# Patient Record
Sex: Female | Born: 1935 | Race: White | Hispanic: No | Marital: Married | Smoking: Former smoker
Health system: Southern US, Community
[De-identification: ages and names within clinical notes are randomized; demographics above are authoritative.]

## PROBLEM LIST (undated history)

## (undated) DIAGNOSIS — C801 Malignant (primary) neoplasm, unspecified: Secondary | ICD-10-CM

## (undated) DIAGNOSIS — E785 Hyperlipidemia, unspecified: Secondary | ICD-10-CM

## (undated) DIAGNOSIS — I272 Pulmonary hypertension, unspecified: Secondary | ICD-10-CM

## (undated) DIAGNOSIS — M1990 Unspecified osteoarthritis, unspecified site: Secondary | ICD-10-CM

## (undated) DIAGNOSIS — I482 Chronic atrial fibrillation, unspecified: Secondary | ICD-10-CM

## (undated) DIAGNOSIS — H409 Unspecified glaucoma: Secondary | ICD-10-CM

## (undated) DIAGNOSIS — J3800 Paralysis of vocal cords and larynx, unspecified: Secondary | ICD-10-CM

## (undated) DIAGNOSIS — I10 Essential (primary) hypertension: Secondary | ICD-10-CM

## (undated) DIAGNOSIS — Z7901 Long term (current) use of anticoagulants: Secondary | ICD-10-CM

## (undated) DIAGNOSIS — I5189 Other ill-defined heart diseases: Secondary | ICD-10-CM

## (undated) HISTORY — DX: Long term (current) use of anticoagulants: Z79.01

## (undated) HISTORY — DX: Unspecified glaucoma: H40.9

## (undated) HISTORY — DX: Pulmonary hypertension, unspecified: I27.20

## (undated) HISTORY — DX: Malignant (primary) neoplasm, unspecified: C80.1

## (undated) HISTORY — DX: Other ill-defined heart diseases: I51.89

## (undated) HISTORY — PX: FACIAL COSMETIC SURGERY: SHX629

## (undated) HISTORY — DX: Unspecified osteoarthritis, unspecified site: M19.90

## (undated) HISTORY — DX: Paralysis of vocal cords and larynx, unspecified: J38.00

## (undated) HISTORY — PX: OTHER SURGICAL HISTORY: SHX169

## (undated) HISTORY — DX: Chronic atrial fibrillation, unspecified: I48.20

## (undated) HISTORY — DX: Hyperlipidemia, unspecified: E78.5

## (undated) HISTORY — PX: BACK SURGERY: SHX140

## (undated) HISTORY — PX: BREAST SURGERY: SHX581

## (undated) HISTORY — DX: Essential (primary) hypertension: I10

---

## 1998-07-10 ENCOUNTER — Encounter: Payer: Self-pay | Admitting: *Deleted

## 1998-07-10 ENCOUNTER — Ambulatory Visit (HOSPITAL_COMMUNITY): Admission: RE | Admit: 1998-07-10 | Discharge: 1998-07-10 | Payer: Self-pay | Admitting: *Deleted

## 1999-10-28 ENCOUNTER — Encounter: Payer: Self-pay | Admitting: *Deleted

## 1999-10-28 ENCOUNTER — Ambulatory Visit (HOSPITAL_COMMUNITY): Admission: RE | Admit: 1999-10-28 | Discharge: 1999-10-28 | Payer: Self-pay | Admitting: *Deleted

## 2000-08-03 ENCOUNTER — Ambulatory Visit (HOSPITAL_COMMUNITY): Admission: RE | Admit: 2000-08-03 | Discharge: 2000-08-03 | Payer: Self-pay | Admitting: *Deleted

## 2001-02-09 ENCOUNTER — Encounter: Admission: RE | Admit: 2001-02-09 | Discharge: 2001-02-09 | Payer: Self-pay | Admitting: Internal Medicine

## 2001-02-09 ENCOUNTER — Encounter: Payer: Self-pay | Admitting: Internal Medicine

## 2001-07-21 HISTORY — PX: JOINT REPLACEMENT: SHX530

## 2002-01-10 ENCOUNTER — Encounter: Payer: Self-pay | Admitting: Orthopaedic Surgery

## 2002-01-13 ENCOUNTER — Inpatient Hospital Stay (HOSPITAL_COMMUNITY): Admission: RE | Admit: 2002-01-13 | Discharge: 2002-01-18 | Payer: Self-pay | Admitting: Orthopaedic Surgery

## 2002-01-18 ENCOUNTER — Inpatient Hospital Stay (HOSPITAL_COMMUNITY)
Admission: RE | Admit: 2002-01-18 | Discharge: 2002-01-21 | Payer: Self-pay | Admitting: Physical Medicine & Rehabilitation

## 2002-05-10 ENCOUNTER — Inpatient Hospital Stay (HOSPITAL_COMMUNITY): Admission: RE | Admit: 2002-05-10 | Discharge: 2002-05-16 | Payer: Self-pay | Admitting: Orthopaedic Surgery

## 2004-01-15 ENCOUNTER — Encounter: Admission: RE | Admit: 2004-01-15 | Discharge: 2004-01-15 | Payer: Self-pay | Admitting: Internal Medicine

## 2004-06-06 ENCOUNTER — Ambulatory Visit: Payer: Self-pay | Admitting: Internal Medicine

## 2004-07-05 ENCOUNTER — Ambulatory Visit: Payer: Self-pay | Admitting: Internal Medicine

## 2004-08-01 ENCOUNTER — Ambulatory Visit: Payer: Self-pay | Admitting: Internal Medicine

## 2004-11-25 ENCOUNTER — Encounter: Admission: RE | Admit: 2004-11-25 | Discharge: 2004-11-25 | Payer: Self-pay | Admitting: Internal Medicine

## 2005-08-26 ENCOUNTER — Ambulatory Visit: Payer: Self-pay | Admitting: Internal Medicine

## 2005-09-02 ENCOUNTER — Ambulatory Visit: Payer: Self-pay | Admitting: *Deleted

## 2005-09-08 ENCOUNTER — Ambulatory Visit: Payer: Self-pay | Admitting: Internal Medicine

## 2005-09-09 ENCOUNTER — Ambulatory Visit: Admission: RE | Admit: 2005-09-09 | Discharge: 2005-09-09 | Payer: Self-pay | Admitting: Internal Medicine

## 2005-09-09 ENCOUNTER — Ambulatory Visit: Payer: Self-pay | Admitting: Internal Medicine

## 2005-09-25 ENCOUNTER — Ambulatory Visit: Payer: Self-pay | Admitting: Internal Medicine

## 2009-07-21 HISTORY — PX: EYE SURGERY: SHX253

## 2011-09-26 ENCOUNTER — Other Ambulatory Visit: Payer: Self-pay

## 2012-08-30 ENCOUNTER — Other Ambulatory Visit: Payer: Self-pay | Admitting: Cardiology

## 2012-08-30 ENCOUNTER — Encounter (HOSPITAL_COMMUNITY): Payer: Self-pay | Admitting: Pharmacy Technician

## 2012-08-30 ENCOUNTER — Encounter: Payer: Self-pay | Admitting: Cardiology

## 2012-08-30 NOTE — H&P (Signed)
Office Visit     Patient: Kelsey [name], [place] Provider: Armanda Magic, MD  DOB: 1932/05/20 Age: 79 Y Sex: Female Date: 08/27/2012  Phone: [phone]   Address: [address], [place], [place]  Pcp: Duane Lope       Subjective:     CC:    1. 4WK FOLLOW UP.        HPI:  General:  The patient presents today for evaluation of new onset atrial fibrillation. She went in for a routine PE and was found to be in atrial fibrillation. She has a history of tachcyardia in remote past and saw Dr. Fraser Din but has never had PAF. She has been on beta blockers for HTN and has not had any palpitations in a long time. She denies any chest pain or SOB. She denies any dizziness or syncope but has had some mild LE which she has had in the past. .        ROS:  See HPI, A twelve system review was perfomed at today's visit. For pertinent positives and negatives see HPI.       Medical History: High blood pressure, Svt, Hypercholesterolemia, basal cell carcinoma, Dr Delano Metz, DJD, Mild pulmonary hypertension, Mild tricuspid regurge, Breast implants, Facelift, Vocal cord polyps, normal LVF, moderate biatrial enlargement, mild LVH, mild TR echo 07/2012, Atrial fibrilaltion on xarelto.        Surgical History: R&L THR 2003, R&L cataracts, Dr Dione Booze 2011, c6-c7 discectomy , basal cell carcinoma removal .        Hospitalization/Major Diagnostic Procedure: not in the past year 04/2011.        Family History:        Social History:  General:  History of smoking  cigarettes: Former smoker Quit in year over 25 years ago no Smoking.  Alcohol: yes, wine.  no Caffeine.  no Recreational drug use.  no Diet.  no Exercise.  Occupation: unemployed, retired.  Marital Status: married.  1 Ch, 2 GCh.       Medications: Fish Oil 1000 MG Capsule 1 tablet daily, Multivitamins Capsule 1 tablet daily, Aspirin 81 MG Tablet 1 tablet Once a day, Metoprolol Tartrate 100 MG Tablet 1 tablet Twice a day, Xarelto 20 mg  . tablet 1 tablet once a day, Losartan Potassium 50 MG Tablet 1 tablet Once a day, Medication List reviewed and reconciled with the patient       Allergies: N.K.D.A.      Objective:     Vitals: Wt 172.2, Wt change -4.6 lb, Ht 67, BMI 26.97, Pulse sitting 110, BP sitting 164/104.       Examination:  Cardiology, General:  GENERAL APPEARANCE: pleasant, NAD.  HEENT: unremarkable.  CAROTID UPSTROKE: normal, no bruit.  JVD: flat.  HEART SOUNDS: normal S1, S2, no S3 or S4, irregularly irregular.  MURMUR: absent.  LUNGS: no rales or wheezes.  ABDOMEN: soft, non tender, positive bowel sounds, no masses felt.  EXTREMITIES: no leg edema.  PERIPHERAL PULSES: 2 plus bilateral.        Assessment:     Assessment:  1. AF (atrial fibrillation) - 427.31 (Primary)  2. Hypertension, essential - 401.1  3. Paroxysmal supraventricular tachycardia - 427.0    Plan:     1. AF (atrial fibrillation)  LAB: Basic Metabolic Normal    GLUCOSE 89 57-84 - mg/dL    BUN 25 6-96 - mg/dL    CREATININE 2.95 2.84-1.32 - mg/dl    eGFR (NON-AFRICAN AMERICAN) 68 >60 - calc  eGFR (AFRICAN AMERICAN) 83 >60 - calc    SODIUM 142 136-145 - mmol/L    POTASSIUM 4.5 3.5-5.5 - mmol/L    CHLORIDE 110 98-107 - mmol/L H   C02 26 22-32 - mmol/L    ANION GAP 10.2 6.0-20.0 - mmol/L    CALCIUM 9.3 8.6-10.3 - mg/dL     TURNER,TRACI M 16/04/9603 12:10:00 PM > Corson,Danielle 08/30/2012 08:54:41 AM > For Cardioversion    LAB: CBC with Diff Normal    WBC 6.8 4.0-11.0 - K/ul    RBC 4.65 4.20-5.40 - M/uL    HGB 14.7 12.0-16.0 - g/dL    HCT 54.0 98.1-19.1 - %    MCH 31.6 27.0-33.0 - pg    MPV 9.0 7.5-10.7 - fL    MCV 100.2 81.0-99.0 - fL H   MCHC 31.6 32.0-36.0 - g/dL L   RDW 47.8 29.5-62.1 - %    NRBC# 0.00 -    PLT 181 150-400 - K/uL    NEUT % 60.2 43.3-71.9 - %    NRBC% 0.10 - %    LYMPH% 25.7 16.8-43.5 - %    MONO % 12.5 4.6-12.4 - % H   EOS % 0.9 0.0-7.8 - %    BASO % 0.7 0.0-1.0 - %    NEUT # 4.1  1.9-7.2 - K/uL    LYMPH# 1.70 1.10-2.70 - K/uL    MONO # 0.8 0.3-0.8 - K/uL    EOS # 0.1 0.0-0.6 - K/uL    BASO # 0.0 0.0-0.1 - K/uL     TURNER,TRACI M 08/29/2012 12:09:28 PM > Corson,Danielle 08/30/2012 08:54:52 AM > For cardioversion    LAB: PT (Prothrombin Time) (308657) Normal    Prothrombin Time 12.8 9.1-12.0 - SEC H   INR 1.2 0.8-1.2 -     TURNER,TRACI M 08/29/2012 12:09:11 PM > Corson,Danielle 08/30/2012 08:54:21 AM > For Cardioversion    Diagnostic Imaging:EKG atrial fibrillation with RVR at 107bpm, Corson,Danielle 08/27/2012 03:20:48 PM > TURNER,TRACI M 08/27/2012 03:27:14 PM >  She has been on Xarelto for 4 weeks. She continues to be in atrial fibrillation. I will plan on DCCV next week.       2. Hypertension, essential  Her BP is very high today. SHe is very anxious and I think that is why her BP is elevated today.        Procedures:  Venipuncture:  Venipuncture: Howell,Kathleen 08/27/2012 04:11:10 PM > , performed in right arm.        Immunizations:        Labs:        Procedure Codes: 84696 EKG I AND R, 80048 ECL BMP, 85025 ECL CBC PLATELET DIFF, 29528 BLOOD COLLECTION ROUTINE VENIPUNCTURE       Preventive:         Follow Up: DCCV      Provider: Armanda Magic, MD  Patient: Kelsey, [name] DOB: [birth year] Date: 08/27/2012

## 2012-08-30 NOTE — Addendum Note (Signed)
Addended by: Armanda Magic on: 08/30/2012 02:25 PM   Modules accepted: Orders

## 2012-09-06 ENCOUNTER — Ambulatory Visit (HOSPITAL_COMMUNITY)
Admission: RE | Admit: 2012-09-06 | Discharge: 2012-09-06 | Disposition: A | Discharge status: Home / Self Care | Payer: Medicare Other | Source: Ambulatory Visit | Attending: Cardiology | Admitting: Cardiology

## 2012-09-06 ENCOUNTER — Encounter (HOSPITAL_COMMUNITY): Payer: Self-pay

## 2012-09-06 ENCOUNTER — Ambulatory Visit (HOSPITAL_COMMUNITY): Payer: Medicare Other | Admitting: Anesthesiology

## 2012-09-06 ENCOUNTER — Encounter (HOSPITAL_COMMUNITY): Payer: Self-pay | Admitting: Anesthesiology

## 2012-09-06 ENCOUNTER — Encounter (HOSPITAL_COMMUNITY)
Admission: RE | Disposition: A | Discharge status: Home / Self Care | Payer: Self-pay | Source: Ambulatory Visit | Attending: Cardiology

## 2012-09-06 DIAGNOSIS — I498 Other specified cardiac arrhythmias: Secondary | ICD-10-CM | POA: Insufficient documentation

## 2012-09-06 DIAGNOSIS — I2789 Other specified pulmonary heart diseases: Secondary | ICD-10-CM | POA: Insufficient documentation

## 2012-09-06 DIAGNOSIS — R030 Elevated blood-pressure reading, without diagnosis of hypertension: Secondary | ICD-10-CM | POA: Insufficient documentation

## 2012-09-06 DIAGNOSIS — I4891 Unspecified atrial fibrillation: Secondary | ICD-10-CM

## 2012-09-06 DIAGNOSIS — I079 Rheumatic tricuspid valve disease, unspecified: Secondary | ICD-10-CM | POA: Insufficient documentation

## 2012-09-06 DIAGNOSIS — E7800 Pure hypercholesterolemia, unspecified: Secondary | ICD-10-CM | POA: Insufficient documentation

## 2012-09-06 HISTORY — PX: CARDIOVERSION: SHX1299

## 2012-09-06 SURGERY — CARDIOVERSION
Anesthesia: Monitor Anesthesia Care

## 2012-09-06 MED ORDER — PROPOFOL 10 MG/ML IV BOLUS
INTRAVENOUS | Status: DC | PRN
  Administered 2012-09-06: 80 mg via INTRAVENOUS

## 2012-09-06 NOTE — Anesthesia Postprocedure Evaluation (Signed)
  Anesthesia Post-op Note  Patient: Kelsey [name]  Procedure(s) Performed: Procedure(s): CARDIOVERSION (N/A)  Patient Location: PACU  Anesthesia Type:General  Level of Consciousness: awake and alert   Airway and Oxygen Therapy: Patient Spontanous Breathing and Patient connected to nasal cannula oxygen  Post-op Pain: none  Post-op Assessment: Post-op Vital signs reviewed  Post-op Vital Signs: Reviewed and stable  Complications: No apparent anesthesia complications

## 2012-09-06 NOTE — Preoperative (Signed)
Beta Blockers   Reason not to administer Beta Blockers:Not Applicable 

## 2012-09-06 NOTE — Anesthesia Preprocedure Evaluation (Addendum)
Anesthesia Evaluation  Patient identified by MRN, date of birth, ID band Patient awake    History of Anesthesia Complications Negative for: history of anesthetic complications  Airway Mallampati: II TM Distance: >3 FB Neck ROM: Full    Dental  (+) Teeth Intact   Pulmonary neg pulmonary ROS,    Pulmonary exam normal       Cardiovascular hypertension, Pt. on home beta blockers and Pt. on medications + dysrhythmias Atrial Fibrillation Rhythm:Irregular Rate:Normal     Neuro/Psych negative neurological ROS     GI/Hepatic negative GI ROS, Neg liver ROS,   Endo/Other  negative endocrine ROS  Renal/GU negative Renal ROS     Musculoskeletal   Abdominal   Peds  Hematology   Anesthesia Other Findings   Reproductive/Obstetrics                          Anesthesia Physical Anesthesia Plan  ASA: III  Anesthesia Plan: MAC   Post-op Pain Management:    Induction: Intravenous  Airway Management Planned: Mask  Additional Equipment:   Intra-op Plan:   Post-operative Plan:   Informed Consent: I have reviewed the patients History and Physical, chart, labs and discussed the procedure including the risks, benefits and alternatives for the proposed anesthesia with the patient or authorized representative who has indicated his/her understanding and acceptance.     Plan Discussed with: CRNA, Anesthesiologist and Surgeon  Anesthesia Plan Comments:         Anesthesia Quick Evaluation

## 2012-09-06 NOTE — Interval H&P Note (Signed)
History and Physical Interval Note:  09/06/2012 9:18 AM  Kelsey [name]  has presented today for surgery, with the diagnosis of a-fib  The various methods of treatment have been discussed with the patient and family. After consideration of risks, benefits and other options for treatment, the patient has consented to  Procedure(s): CARDIOVERSION (N/A) as a surgical intervention .  The patient's history has been reviewed, patient examined, no change in status, stable for surgery.  I have reviewed the patient's chart and labs.  Questions were answered to the patient's satisfaction.     Evamarie Raetz R
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
## 2012-09-06 NOTE — H&P (View-Only) (Signed)
Office Visit     Patient: Kelsey [name], Kelsey [name] Provider: Azim Gillingham, MD  DOB: 06/22/1932 Age: 80 Y Sex: Female Date: 08/27/2012  Phone: [phone]   Address: [address], [place], [place]  Pcp: ALAN ROSS       Subjective:     CC:    1. 4WK FOLLOW UP.        HPI:  General:  The patient presents today for evaluation of new onset atrial fibrillation. She went in for a routine PE and was found to be in atrial fibrillation. She has a history of tachcyardia in remote past and saw Dr. Preston but has never had PAF. She has been on beta blockers for HTN and has not had any palpitations in a long time. She denies any chest pain or SOB. She denies any dizziness or syncope but has had some mild LE which she has had in the past. .        ROS:  See HPI, A twelve system review was perfomed at today's visit. For pertinent positives and negatives see HPI.       Medical History: High blood pressure, Svt, Hypercholesterolemia, basal cell carcinoma, Dr Steinhelper, DJD, Mild pulmonary hypertension, Mild tricuspid regurge, Breast implants, Facelift, Vocal cord polyps, normal LVF, moderate biatrial enlargement, mild LVH, mild TR echo 07/2012, Atrial fibrilaltion on xarelto.        Surgical History: [name]&L THR 2003, [name]&L cataracts, Dr Groat 2011, c6-c7 discectomy , basal cell carcinoma removal .        Hospitalization/Major Diagnostic Procedure: not in the past year 04/2011.        Family History:        Social History:  General:  History of smoking  cigarettes: Former smoker Quit in year over 25 years ago no Smoking.  Alcohol: yes, wine.  no Caffeine.  no Recreational drug use.  no Diet.  no Exercise.  Occupation: unemployed, retired.  Marital Status: married.  1 Ch, 2 GCh.       Medications: Fish Oil 1000 MG Capsule 1 tablet daily, Multivitamins Capsule 1 tablet daily, Aspirin 81 MG Tablet 1 tablet Once a day, Metoprolol Tartrate 100 MG Tablet 1 tablet Twice a day, Xarelto 20 mg  . tablet 1 tablet once a day, Losartan Potassium 50 MG Tablet 1 tablet Once a day, Medication List reviewed and reconciled with the patient       Allergies: N.K.D.A.      Objective:     Vitals: Wt 172.2, Wt change -4.6 lb, Ht 67, BMI 26.97, Pulse sitting 110, BP sitting 164/104.       Examination:  Cardiology, General:  GENERAL APPEARANCE: pleasant, NAD.  HEENT: unremarkable.  CAROTID UPSTROKE: normal, no bruit.  JVD: flat.  HEART SOUNDS: normal S1, S2, no S3 or S4, irregularly irregular.  MURMUR: absent.  LUNGS: no rales or wheezes.  ABDOMEN: soft, non tender, positive bowel sounds, no masses felt.  EXTREMITIES: no leg edema.  PERIPHERAL PULSES: 2 plus bilateral.        Assessment:     Assessment:  1. AF (atrial fibrillation) - 427.31 (Primary)  2. Hypertension, essential - 401.1  3. Paroxysmal supraventricular tachycardia - 427.0    Plan:     1. AF (atrial fibrillation)  LAB: Basic Metabolic Normal    GLUCOSE 89 70-99 - mg/dL    BUN 25 6-26 - mg/dL    CREATININE 0.81 0.60-1.30 - mg/dl    eGFR (NON-AFRICAN AMERICAN) 68 >60 - calc      eGFR (AFRICAN AMERICAN) 83 >60 - calc    SODIUM 142 136-145 - mmol/L    POTASSIUM 4.5 3.5-5.5 - mmol/L    CHLORIDE 110 98-107 - mmol/L H   C02 26 22-32 - mmol/L    ANION GAP 10.2 6.0-20.0 - mmol/L    CALCIUM 9.3 8.6-10.3 - mg/dL     Lexie Morini M 08/29/2012 12:10:00 PM > Corson,Danielle 08/30/2012 08:54:41 AM > For Cardioversion    LAB: CBC with Diff Normal    WBC 6.8 4.0-11.0 - K/ul    RBC 4.65 4.20-5.40 - M/uL    HGB 14.7 12.0-16.0 - g/dL    HCT 46.6 37.0-47.0 - %    MCH 31.6 27.0-33.0 - pg    MPV 9.0 7.5-10.7 - fL    MCV 100.2 81.0-99.0 - fL H   MCHC 31.6 32.0-36.0 - g/dL L   RDW 13.1 11.5-15.5 - %    NRBC# 0.00 -    PLT 181 150-400 - K/uL    NEUT % 60.2 43.3-71.9 - %    NRBC% 0.10 - %    LYMPH% 25.7 16.8-43.5 - %    MONO % 12.5 4.6-12.4 - % H   EOS % 0.9 0.0-7.8 - %    BASO % 0.7 0.0-1.0 - %    NEUT # 4.1  1.9-7.2 - K/uL    LYMPH# 1.70 1.10-2.70 - K/uL    MONO # 0.8 0.3-0.8 - K/uL    EOS # 0.1 0.0-0.6 - K/uL    BASO # 0.0 0.0-0.1 - K/uL     Ardath Lepak M 08/29/2012 12:09:28 PM > Corson,Danielle 08/30/2012 08:54:52 AM > For cardioversion    LAB: PT (Prothrombin Time) (005199) Normal    Prothrombin Time 12.8 9.1-12.0 - SEC H   INR 1.2 0.8-1.2 -     Azaiah Licciardi M 08/29/2012 12:09:11 PM > Corson,Danielle 08/30/2012 08:54:21 AM > For Cardioversion    Diagnostic Imaging:EKG atrial fibrillation with RVR at 107bpm, Corson,Danielle 08/27/2012 03:20:48 PM > Lunell Robart M 08/27/2012 03:27:14 PM >  She has been on Xarelto for 4 weeks. She continues to be in atrial fibrillation. I will plan on DCCV next week.       2. Hypertension, essential  Her BP is very high today. SHe is very anxious and I think that is why her BP is elevated today.        Procedures:  Venipuncture:  Venipuncture: Howell,Kathleen 08/27/2012 04:11:10 PM > , performed in right arm.        Immunizations:        Labs:        Procedure Codes: 93000 EKG I AND [name], 80048 ECL BMP, 85025 ECL CBC PLATELET DIFF, 36415 BLOOD COLLECTION ROUTINE VENIPUNCTURE       Preventive:         Follow Up: DCCV      Provider: Averey Koning, MD  Patient: Kelsey [name], Kelsey [name] DOB: 01/17/1932 Date: 08/27/2012    

## 2012-09-06 NOTE — Transfer of Care (Signed)
Immediate Anesthesia Transfer of Care Note  Patient: Kelsey [name]  Procedure(s) Performed: Procedure(s): CARDIOVERSION (N/A)  Patient Location: PACU  Anesthesia Type:General  Level of Consciousness: awake and alert   Airway & Oxygen Therapy: Patient Spontanous Breathing and Patient connected to nasal cannula oxygen  Post-op Assessment: Report given to PACU RN  Post vital signs: Reviewed  Complications: No apparent anesthesia complications

## 2012-09-07 ENCOUNTER — Encounter (HOSPITAL_COMMUNITY): Payer: Self-pay | Admitting: Cardiology

## 2012-12-24 ENCOUNTER — Other Ambulatory Visit: Payer: Self-pay

## 2013-05-02 ENCOUNTER — Other Ambulatory Visit: Payer: Self-pay | Admitting: *Deleted

## 2013-05-02 MED ORDER — RIVAROXABAN 20 MG PO TABS: 20.0000 mg | ORAL_TABLET | Freq: Every day | ORAL | Status: DC

## 2013-05-03 ENCOUNTER — Other Ambulatory Visit: Payer: Self-pay | Admitting: General Surgery

## 2013-05-03 MED ORDER — RIVAROXABAN 20 MG PO TABS: 20.0000 mg | ORAL_TABLET | Freq: Every day | ORAL | Status: DC

## 2013-05-05 ENCOUNTER — Encounter: Payer: Self-pay | Admitting: Cardiology

## 2013-05-05 DIAGNOSIS — J3800 Paralysis of vocal cords and larynx, unspecified: Secondary | ICD-10-CM | POA: Insufficient documentation

## 2013-05-05 DIAGNOSIS — C801 Malignant (primary) neoplasm, unspecified: Secondary | ICD-10-CM | POA: Insufficient documentation

## 2013-05-05 DIAGNOSIS — I272 Pulmonary hypertension, unspecified: Secondary | ICD-10-CM | POA: Insufficient documentation

## 2013-05-05 DIAGNOSIS — I10 Essential (primary) hypertension: Secondary | ICD-10-CM | POA: Insufficient documentation

## 2013-05-05 DIAGNOSIS — M1990 Unspecified osteoarthritis, unspecified site: Secondary | ICD-10-CM | POA: Insufficient documentation

## 2013-05-05 DIAGNOSIS — E785 Hyperlipidemia, unspecified: Secondary | ICD-10-CM | POA: Insufficient documentation

## 2013-05-08 ENCOUNTER — Encounter: Payer: Self-pay | Admitting: Cardiology

## 2013-05-08 DIAGNOSIS — J309 Allergic rhinitis, unspecified: Secondary | ICD-10-CM | POA: Insufficient documentation

## 2013-05-08 DIAGNOSIS — J381 Polyp of vocal cord and larynx: Secondary | ICD-10-CM | POA: Insufficient documentation

## 2013-05-08 DIAGNOSIS — I471 Supraventricular tachycardia: Secondary | ICD-10-CM | POA: Insufficient documentation

## 2013-05-09 ENCOUNTER — Encounter: Payer: Self-pay | Admitting: Cardiology

## 2013-05-09 ENCOUNTER — Ambulatory Visit (INDEPENDENT_AMBULATORY_CARE_PROVIDER_SITE_OTHER): Payer: Medicare Other | Admitting: Cardiology

## 2013-05-09 VITALS — BP 132/80 | HR 80 | Ht 67.5 in | Wt 176.0 lb

## 2013-05-09 DIAGNOSIS — I471 Supraventricular tachycardia, unspecified: Secondary | ICD-10-CM

## 2013-05-09 DIAGNOSIS — I10 Essential (primary) hypertension: Secondary | ICD-10-CM

## 2013-05-09 DIAGNOSIS — I5189 Other ill-defined heart diseases: Secondary | ICD-10-CM

## 2013-05-09 DIAGNOSIS — I519 Heart disease, unspecified: Secondary | ICD-10-CM

## 2013-05-09 DIAGNOSIS — I4891 Unspecified atrial fibrillation: Secondary | ICD-10-CM

## 2013-05-09 DIAGNOSIS — Z7901 Long term (current) use of anticoagulants: Secondary | ICD-10-CM

## 2013-05-09 LAB — BASIC METABOLIC PANEL
BUN: 16 mg/dL (ref 6–23)
Chloride: 101 mEq/L (ref 96–112)
Glucose, Bld: 88 mg/dL (ref 70–99)
Potassium: 4.4 mEq/L (ref 3.5–5.1)

## 2013-05-09 MED ORDER — RIVAROXABAN 20 MG PO TABS: 20.0000 mg | ORAL_TABLET | Freq: Every day | ORAL | Status: DC

## 2013-05-09 MED ORDER — HYDROCHLOROTHIAZIDE 25 MG PO TABS: 50.0000 mg | ORAL_TABLET | Freq: Every day | ORAL | Status: DC

## 2013-05-09 NOTE — Patient Instructions (Signed)
Your physician recommends that you continue on your current medications as directed. Please refer to the Current Medication list given to you today.  Please go to the lab today to have a Bmet and Dig level drawn  Your physician wants you to follow-up in: 6 months with Dr. Sherlyn Lick will receive a reminder letter in the mail two months in advance. If you don't receive a letter, please call our office to schedule the follow-up appointment.   I sent in your two rx as requested to the [facility] [address] in [place].

## 2013-05-09 NOTE — Progress Notes (Signed)
  [address] [place], [place]  [address] Phone: [phone] Fax:  [phone]  Date:  05/09/2013   ID:  Kelsey [name], DOB 13-Aug-1931, MRN [id number]  PCP:  No primary provider on file.  Cardiologist:  Armanda Magic, MD     History of Present Illness: Kelsey [name] is a 79 y.o. female with a history of HTN/SVT and PAF on chronic anticoagulation.  She is doing well.  She denies any chest pain, SOB, DOE, dizziness, palpitations or syncope.  She occasionally has some mild LE edema which is controlled with the HCTZ.  She actually increased the HCTZ which has helped with the swelling.  Wt Readings from Last 3 Encounters:  09/06/12 167 lb (75.751 kg)  09/06/12 167 lb (75.751 kg)     Past Medical History  Diagnosis Date  . Hypertension   . Dyslipidemia   . Cancer     basal cell CA  . Arthritis     DJD  . Pulmonary HTN     mild - RVSP 40-34mmHg  . Vocal cord paralysis   . Dysrhythmia     SVT/atrial fibrillation  . Hyperlipidemia   . Chronic anticoagulation     on Xarelto  . Diastolic dysfunction     by echo 07/2012    Current Outpatient Prescriptions  Medication Sig Dispense Refill  . acetaminophen (TYLENOL) 325 MG tablet Take 650 mg by mouth daily as needed. For pain      . digoxin (LANOXIN) 0.125 MG tablet Take 0.125 mg by mouth daily.      . fish oil-omega-3 fatty acids 1000 MG capsule Take 1 g by mouth daily.      . hydrochlorothiazide (HYDRODIURIL) 25 MG tablet Take 50 mg by mouth daily.       Marland Kitchen losartan (COZAAR) 100 MG tablet Take 100 mg by mouth daily.      . metoprolol (LOPRESSOR) 100 MG tablet Take 100 mg by mouth daily.       . Rivaroxaban (XARELTO) 20 MG TABS tablet Take 1 tablet (20 mg total) by mouth daily with supper.  30 tablet  6   No current facility-administered medications for this visit.    Allergies:    Allergies  Allergen Reactions  . Cardizem [Diltiazem Hcl] Swelling  . Clonidine Derivatives Swelling  .  Doxazosin Swelling    Social History:  The patient  reports that she has quit smoking. She does not have any smokeless tobacco history on file. She reports that she drinks alcohol. She reports that she does not use illicit drugs.   Family History:  The patient's family history includes Alzheimer's disease in her mother.   ROS:  Please see the history of present illness.      All other systems reviewed and negative.   PHYSICAL EXAM: VS:  There were no vitals taken for this visit. Well nourished, well developed, in no acute distress HEENT: normal Neck: no JVD Cardiac:  normal S1, S2; irregularly irregular; no murmur Lungs:  clear to auscultation bilaterally, no wheezing, rhonchi or rales Abd: soft, nontender, no hepatomegaly Ext: no edema Skin: warm and dry Neuro:  CNs 2-12 intact, no focal abnormalities noted    ASSESSMENT AND PLAN:  1. PAF maintaining NSR  - continue digoxin/Xarelto/metoprolol  - check dig level/BMET 2. Chronic anticoagulation 3. HTN  - continue HCTZ/Losartan/Metoprolol 4. H/O SVT  Followup with me in 6 months  Signed, Armanda Magic, MD 05/09/2013 11:21 AM

## 2013-05-12 ENCOUNTER — Telehealth: Payer: Self-pay | Admitting: Cardiology

## 2013-05-12 NOTE — Telephone Encounter (Signed)
Follow up     Pt returning your call.   Please call her cell  [phone]   Thanks!

## 2013-05-12 NOTE — Telephone Encounter (Signed)
Spoke with pt and made her aware of lab results.  

## 2013-06-24 ENCOUNTER — Telehealth: Payer: Self-pay | Admitting: Cardiology

## 2013-06-24 NOTE — Telephone Encounter (Signed)
New Problem:  Pt is requesting samples and refills. Please give pt a call back to discuss.

## 2013-06-27 ENCOUNTER — Telehealth: Payer: Self-pay | Admitting: *Deleted

## 2013-06-27 NOTE — Telephone Encounter (Signed)
Patient requests xarelto samples. She is aware that they will be at the front desk for pick up. 

## 2013-07-19 ENCOUNTER — Other Ambulatory Visit: Payer: Self-pay | Admitting: Cardiology

## 2013-09-07 ENCOUNTER — Telehealth: Payer: Self-pay | Admitting: Cardiology

## 2013-09-07 NOTE — Telephone Encounter (Signed)
She is fine to use nasal saline nasal spray, mucinex (without the D), nasal steroid spray that she can get from her PCP

## 2013-09-07 NOTE — Telephone Encounter (Signed)
New message  Patient has really bad cold and wants to know what is safe for her to take OTC? Please call and advise.

## 2013-09-07 NOTE — Telephone Encounter (Signed)
Pt.notified

## 2013-09-07 NOTE — Telephone Encounter (Signed)
To Dr Turner to advise 

## 2013-10-21 ENCOUNTER — Other Ambulatory Visit: Payer: Self-pay

## 2013-10-21 MED ORDER — LOSARTAN POTASSIUM 100 MG PO TABS: ORAL_TABLET | ORAL | Status: DC

## 2013-11-25 ENCOUNTER — Other Ambulatory Visit: Payer: Self-pay | Admitting: Cardiology

## 2013-12-02 ENCOUNTER — Other Ambulatory Visit: Payer: Self-pay

## 2013-12-02 MED ORDER — DIGOXIN 125 MCG PO TABS: 0.1250 mg | ORAL_TABLET | Freq: Every day | ORAL | Status: DC

## 2013-12-19 ENCOUNTER — Other Ambulatory Visit: Payer: Self-pay | Admitting: Cardiology

## 2013-12-19 ENCOUNTER — Other Ambulatory Visit: Payer: Self-pay | Admitting: *Deleted

## 2013-12-19 ENCOUNTER — Telehealth: Payer: Self-pay | Admitting: *Deleted

## 2013-12-19 MED ORDER — METOPROLOL TARTRATE 100 MG PO TABS: 100.0000 mg | ORAL_TABLET | Freq: Two times a day (BID) | ORAL | Status: DC

## 2013-12-19 NOTE — Telephone Encounter (Signed)
Kelsey [name] records show Metoprolol Tartrate 100 mg BID. I need to confirm with patient.

## 2013-12-19 NOTE — Telephone Encounter (Signed)
The patient stated that she does take the 100mg  bid. I will send in refill.

## 2013-12-19 NOTE — Telephone Encounter (Signed)
Can you clarify metoprolol dose so that I can send refill in to [facility]? Thanks, MI

## 2013-12-20 ENCOUNTER — Telehealth: Payer: Self-pay | Admitting: Cardiology

## 2013-12-20 NOTE — Telephone Encounter (Signed)
New message ° ° ° °Returning [name]'s call °

## 2013-12-20 NOTE — Telephone Encounter (Signed)
See refill telephone note

## 2014-01-18 ENCOUNTER — Other Ambulatory Visit: Payer: Self-pay | Admitting: Cardiology

## 2014-01-23 ENCOUNTER — Other Ambulatory Visit: Payer: Self-pay | Admitting: Cardiology

## 2014-02-16 ENCOUNTER — Other Ambulatory Visit: Payer: Self-pay | Admitting: Cardiology

## 2014-03-07 ENCOUNTER — Ambulatory Visit (INDEPENDENT_AMBULATORY_CARE_PROVIDER_SITE_OTHER): Payer: Medicare FFS | Admitting: Cardiology

## 2014-03-07 ENCOUNTER — Encounter: Payer: Self-pay | Admitting: Cardiology

## 2014-03-07 VITALS — BP 130/78 | HR 99 | Ht 67.5 in | Wt 173.1 lb

## 2014-03-07 DIAGNOSIS — I272 Pulmonary hypertension, unspecified: Secondary | ICD-10-CM

## 2014-03-07 DIAGNOSIS — I471 Supraventricular tachycardia: Secondary | ICD-10-CM

## 2014-03-07 DIAGNOSIS — Z7901 Long term (current) use of anticoagulants: Secondary | ICD-10-CM

## 2014-03-07 DIAGNOSIS — I482 Chronic atrial fibrillation, unspecified: Secondary | ICD-10-CM | POA: Insufficient documentation

## 2014-03-07 DIAGNOSIS — R05 Cough: Secondary | ICD-10-CM

## 2014-03-07 DIAGNOSIS — R053 Chronic cough: Secondary | ICD-10-CM

## 2014-03-07 DIAGNOSIS — I10 Essential (primary) hypertension: Secondary | ICD-10-CM

## 2014-03-07 DIAGNOSIS — R059 Cough, unspecified: Secondary | ICD-10-CM

## 2014-03-07 DIAGNOSIS — I2789 Other specified pulmonary heart diseases: Secondary | ICD-10-CM

## 2014-03-07 DIAGNOSIS — I4891 Unspecified atrial fibrillation: Secondary | ICD-10-CM

## 2014-03-07 LAB — BASIC METABOLIC PANEL
BUN: 23 mg/dL (ref 6–23)
CO2: 32 mEq/L (ref 19–32)
Calcium: 9.5 mg/dL (ref 8.4–10.5)
Chloride: 99 mEq/L (ref 96–112)
Creatinine, Ser: 0.9 mg/dL (ref 0.4–1.2)
GFR: 64.91 mL/min (ref >60.00)
Glucose, Bld: 88 mg/dL (ref 70–99)
POTASSIUM: 4.1 meq/L (ref 3.5–5.1)
SODIUM: 138 meq/L (ref 135–145)

## 2014-03-07 MED ORDER — AMLODIPINE BESYLATE 5 MG PO TABS: 5.0000 mg | ORAL_TABLET | Freq: Every day | ORAL | Status: DC

## 2014-03-07 NOTE — Progress Notes (Signed)
[place], [place], [place]  [address] Phone: [phone] Fax:  [phone]  Date:  03/07/2014   ID:  AYANO DOUTHITT, DOB Mar 06, 1932, MRN [id number]  PCP:  No primary provider on file.  Cardiologist:  Fransico Him, MD     History of Present Illness: Kelsey [name] is a 80 y.o. female with a history of HTN/SVT and chronic anticoagulation on chronic anticoagulation. She is doing well. She denies any chest pain, SOB, DOE, dizziness, palpitations or syncope. She occasionally has some mild LE edema which recently has gotten worse and Dr. Harrington Challenger stopped her HCTZ and started her on Lasix. The change to Lasix has helped.  She admits to using table salt.   Since I saw her last she has been diagnosed with glaucoma.  She also complains of a dry nonproductive cough that started when she was on a BP and has continued since being on the Losartan.   Wt Readings from Last 3 Encounters:  03/07/14 173 lb 1.9 oz (78.527 kg)  05/09/13 176 lb (79.833 kg)  09/06/12 167 lb (75.751 kg)     Past Medical History  Diagnosis Date  . Hypertension   . Dyslipidemia   . Cancer     basal cell CA  . Arthritis     DJD  . Pulmonary HTN     mild - RVSP 40-5mmHg  . Vocal cord paralysis   . Hyperlipidemia   . Chronic anticoagulation     on Xarelto  . Diastolic dysfunction     by echo 07/2012  . Chronic atrial fibrillation     s/p failed DCCV now on rate control  . Glaucoma       Allergies:    Allergies  Allergen Reactions  . Cardizem [Diltiazem Hcl] Swelling  . Clonidine Derivatives Swelling  . Doxazosin Swelling    Social History:  The patient  reports that she has quit smoking. She does not have any smokeless tobacco history on file. She reports that she drinks alcohol. She reports that she does not use illicit drugs.   Family History:  The patient's family history includes Alzheimer's disease in her mother.   ROS:  Please see the history of present illness.       All other systems reviewed and negative.   PHYSICAL EXAM: VS:  BP 130/78  Pulse 99  Ht 5' 7.5" (1.715 m)  Wt 173 lb 1.9 oz (78.527 kg)  BMI 26.70 kg/m2 Well nourished, well developed, in no acute distress HEENT: normal Neck: no JVD Cardiac:  normal S1, S2; RRR; no murmur Lungs:  clear to auscultation bilaterally, no wheezing, rhonchi or rales Abd: soft, nontender, no hepatomegaly Ext: no edema Skin: warm and dry Neuro:  CNs 2-12 intact, no focal abnormalities noted  EKG:    Atrial fibrillation with HR 99bpm with ST/T wave normalities in the inferolateral leads   ASSESSMENT AND PLAN:  1. Chronic atrial fibrillation with rate controlled. - continue digoxin/Xarelto/metoprolol  - check dig level/BMET  2. Chronic anticoagulation 3. HTN - now having a chronic cough which I am concerned may be due to her ARB - continue Metoprolol  - stop losartan and start amlodipine 5mg  daily - followup with PA in 2 weeks 4. H/O SVT 5. Mild pulmonary HTN PASP 40-6mmHg on echo 2013 - repeat echo to reassess 6.  LE edema - continue lasix.  I have counseled her in a low sodium diet 2gm daily.  Followup with me in 6 months  Signed, Fransico Him, MD 03/07/2014 1:38 PM

## 2014-03-07 NOTE — Patient Instructions (Addendum)
Your physician has recommended you make the following change in your medication: 1. Stop Losartan 2. Amlodipine 5 MG 1 tablet daily  Your physician recommends that you go tot he lab today for a Digoxin level and BMET  Your physician has requested that you have an echocardiogram. Echocardiography is a painless test that uses sound waves to create images of your heart. It provides your doctor with information about the size and shape of your heart and how well your heart's chambers and valves are working. This procedure takes approximately one hour. There are no restrictions for this procedure.  Your physician has requested that you have an echocardiogram. Echocardiography is a painless test that uses sound waves to create images of your heart. It provides your doctor with information about the size and shape of your heart and how well your heart's chambers and valves are working. This procedure takes approximately one hour. There are no restrictions for this procedure.  Your physician recommends that you schedule a follow-up appointment in: 2 weeks with A NP or PA  Low-Sodium Eating Plan Sodium raises blood pressure and causes water to be held in the body. Getting less sodium from food will help lower your blood pressure, reduce any swelling, and protect your heart, liver, and kidneys. We get sodium by adding salt (sodium chloride) to food. Most of our sodium comes from canned, boxed, and frozen foods. Restaurant foods, fast foods, and pizza are also very high in sodium. Even if you take medicine to lower your blood pressure or to reduce fluid in your body, getting less sodium from your food is important. WHAT IS MY PLAN? Most people should limit their sodium intake to 2,300 mg a day. Your health care provider recommends that you limit your sodium intake to __________ a day.  WHAT DO I NEED TO KNOW ABOUT THIS EATING PLAN? For the low-sodium eating plan, you will follow these general  guidelines:  Choose foods with a % Daily Value for sodium of less than 5% (as listed on the food label).   Use salt-free seasonings or herbs instead of table salt or sea salt.   Check with your health care provider or pharmacist before using salt substitutes.   Eat fresh foods.  Eat more vegetables and fruits.  Limit canned vegetables. If you do use them, rinse them well to decrease the sodium.   Limit cheese to 1 oz (28 g) per day.   Eat lower-sodium products, often labeled as "lower sodium" or "no salt added."  Avoid foods that contain monosodium glutamate (MSG). MSG is sometimes added to [place] food and some canned foods.  Check food labels (Nutrition Facts labels) on foods to learn how much sodium is in one serving.  Eat more home-cooked food and less restaurant, buffet, and fast food.  When eating at a restaurant, ask that your food be prepared with less salt or none, if possible.  HOW DO I READ FOOD LABELS FOR SODIUM INFORMATION? The Nutrition Facts label lists the amount of sodium in one serving of the food. If you eat more than one serving, you must multiply the listed amount of sodium by the number of servings. Food labels may also identify foods as:  Sodium free--Less than 5 mg in a serving.  Very low sodium--35 mg or less in a serving.  Low sodium--140 mg or less in a serving.  Light in sodium--50% less sodium in a serving. For example, if a food that usually has 300 mg of sodium  is changed to become light in sodium, it will have 150 mg of sodium.  Reduced sodium--25% less sodium in a serving. For example, if a food that usually has 400 mg of sodium is changed to reduced sodium, it will have 300 mg of sodium. WHAT FOODS CAN I EAT? Grains Low-sodium cereals, including oats, puffed wheat and rice, and shredded wheat cereals. Low-sodium crackers. Unsalted rice and pasta. Lower-sodium bread.  Vegetables Frozen or fresh vegetables. Low-sodium or  reduced-sodium canned vegetables. Low-sodium or reduced-sodium tomato sauce and paste. Low-sodium or reduced-sodium tomato and vegetable juices.  Fruits Fresh, frozen, and canned fruit. Fruit juice.  Meat and Other Protein Products Low-sodium canned tuna and salmon. Fresh or frozen meat, poultry, seafood, and fish. Lamb. Unsalted nuts. Dried beans, peas, and lentils without added salt. Unsalted canned beans. Homemade soups without salt. Eggs.  Dairy Milk. Soy milk. Ricotta cheese. Low-sodium or reduced-sodium cheeses. Yogurt.  Condiments Fresh and dried herbs and spices. Salt-free seasonings. Onion and garlic powders. Low-sodium varieties of mustard and ketchup. Lemon juice.  Fats and Oils Reduced-sodium salad dressings. Unsalted butter.  Other Unsalted popcorn and pretzels.  The items listed above may not be a complete list of recommended foods or beverages. Contact your dietitian for more options. WHAT FOODS ARE NOT RECOMMENDED? Grains Instant hot cereals. Bread stuffing, pancake, and biscuit mixes. Croutons. Seasoned rice or pasta mixes. Noodle soup cups. Boxed or frozen macaroni and cheese. Self-rising flour. Regular salted crackers. Vegetables Regular canned vegetables. Regular canned tomato sauce and paste. Regular tomato and vegetable juices. Frozen vegetables in sauces. Salted french fries. Olives. Angie Fava. Relishes. Sauerkraut. Salsa. Meat and Other Protein Products Salted, canned, smoked, spiced, or pickled meats, seafood, or fish. Bacon, ham, sausage, hot dogs, corned beef, chipped beef, and packaged luncheon meats. Salt pork. Jerky. Pickled herring. Anchovies, regular canned tuna, and sardines. Salted nuts. Dairy Processed cheese and cheese spreads. Cheese curds. Blue cheese and cottage cheese. Buttermilk.  Condiments Onion and garlic salt, seasoned salt, table salt, and sea salt. Canned and packaged gravies. Worcestershire sauce. Tartar sauce. Barbecue sauce.  Teriyaki sauce. Soy sauce, including reduced sodium. Steak sauce. Fish sauce. Oyster sauce. Cocktail sauce. Horseradish. Regular ketchup and mustard. Meat flavorings and tenderizers. Bouillon cubes. Hot sauce. Tabasco sauce. Marinades. Taco seasonings. Relishes. Fats and Oils Regular salad dressings. Salted butter. Margarine. Ghee. Bacon fat.  Other Potato and tortilla chips. Corn chips and puffs. Salted popcorn and pretzels. Canned or dried soups. Pizza. Frozen entrees and pot pies.  The items listed above may not be a complete list of foods and beverages to avoid. Contact your dietitian for more information. Document Released: 12/27/2001 Document Revised: 07/12/2013 Document Reviewed: 05/11/2013 [facility] Patient Information 2015 [place], [place]. This information is not intended to replace advice given to you by your health care provider. Make sure you discuss any questions you have with your health care provider.  Your physician wants you to follow-up in: 6 months with Dr Mallie Snooks will receive a reminder letter in the mail two months in advance. If you don't receive a letter, please call our office to schedule the follow-up appointment.

## 2014-03-08 ENCOUNTER — Encounter: Payer: Self-pay | Admitting: General Surgery

## 2014-03-08 LAB — DIGOXIN LEVEL: Digoxin Level: 0.6 ng/mL — ABNORMAL LOW (ref 0.8–2.0)

## 2014-03-09 ENCOUNTER — Telehealth: Payer: Self-pay | Admitting: *Deleted

## 2014-03-09 NOTE — Telephone Encounter (Signed)
Xarelto samples placed at the front desk for pick up.

## 2014-03-13 ENCOUNTER — Ambulatory Visit (HOSPITAL_COMMUNITY): Payer: Medicare FFS | Attending: Cardiology

## 2014-03-13 DIAGNOSIS — I2789 Other specified pulmonary heart diseases: Secondary | ICD-10-CM | POA: Diagnosis not present

## 2014-03-13 DIAGNOSIS — I272 Pulmonary hypertension, unspecified: Secondary | ICD-10-CM

## 2014-03-13 NOTE — Progress Notes (Signed)
2D Echo completed. 03/13/2014

## 2014-03-14 ENCOUNTER — Other Ambulatory Visit: Payer: Self-pay

## 2014-03-14 MED ORDER — DIGOXIN 125 MCG PO TABS: 0.1250 mg | ORAL_TABLET | Freq: Every day | ORAL | Status: DC

## 2014-03-19 ENCOUNTER — Other Ambulatory Visit: Payer: Self-pay | Admitting: Cardiology

## 2014-03-22 ENCOUNTER — Encounter: Payer: Self-pay | Admitting: Physician Assistant

## 2014-03-22 ENCOUNTER — Ambulatory Visit (INDEPENDENT_AMBULATORY_CARE_PROVIDER_SITE_OTHER): Payer: Medicare FFS | Admitting: Physician Assistant

## 2014-03-22 VITALS — BP 110/72 | HR 73 | Ht 67.75 in | Wt 172.6 lb

## 2014-03-22 DIAGNOSIS — I2789 Other specified pulmonary heart diseases: Secondary | ICD-10-CM

## 2014-03-22 DIAGNOSIS — I10 Essential (primary) hypertension: Secondary | ICD-10-CM

## 2014-03-22 DIAGNOSIS — R059 Cough, unspecified: Secondary | ICD-10-CM

## 2014-03-22 DIAGNOSIS — I4891 Unspecified atrial fibrillation: Secondary | ICD-10-CM

## 2014-03-22 DIAGNOSIS — R05 Cough: Secondary | ICD-10-CM

## 2014-03-22 DIAGNOSIS — I272 Pulmonary hypertension, unspecified: Secondary | ICD-10-CM

## 2014-03-22 DIAGNOSIS — I482 Chronic atrial fibrillation, unspecified: Secondary | ICD-10-CM

## 2014-03-22 DIAGNOSIS — I519 Heart disease, unspecified: Secondary | ICD-10-CM

## 2014-03-22 DIAGNOSIS — I5189 Other ill-defined heart diseases: Secondary | ICD-10-CM

## 2014-03-22 DIAGNOSIS — R053 Chronic cough: Secondary | ICD-10-CM

## 2014-03-22 DIAGNOSIS — I471 Supraventricular tachycardia: Secondary | ICD-10-CM

## 2014-03-22 NOTE — Patient Instructions (Signed)
Your Physician recommends TED Hose   Your physician wants you to follow-up in: 6 months with Dr. Mallie Snooks will receive a reminder letter in the mail two months in advance. If you don't receive a letter, please call our office to schedule the follow-up appointment.

## 2014-03-22 NOTE — Assessment & Plan Note (Signed)
Patient's cough did not improve off the losartan. She had trouble with ankle edema on amlodipine so she stopped this and resume the losartan. She is to see her primary care in 2 weeks to discuss her chronic cough.

## 2014-03-22 NOTE — Assessment & Plan Note (Signed)
controlled 

## 2014-03-22 NOTE — Assessment & Plan Note (Signed)
Stable on digoxin, metoprolol and Xarelto.

## 2014-03-22 NOTE — Assessment & Plan Note (Signed)
Chronic lower extremity edema. Recommend compression stockings.

## 2014-03-22 NOTE — Assessment & Plan Note (Signed)
Improved on most recent 2-D echo

## 2014-03-22 NOTE — Progress Notes (Signed)
HPI: This is a 80 year old female patient Dr. Radford Pax who has history of hypertension, atrial fibrillation on chronic anticoagulation and history of SVT. She was recently seen and felt to have a chronic cough secondary to an ARB. Her losartan was stopped and amlodipine initiated. Repeat 2-D echo on 03/13/14 showed normal LV function with mildly elevated pulmonary pressures PA peak pressure was 37 mm mercury.  Patient states her cough did not improve with amlodipine and her swelling in her legs worsened. She stopped amlodipine and went back on losartan. She's been on this successfully for years and doesn't think it's causing her cough. Overall she feels great and denies any chest pain, palpitations, dyspnea, dyspnea on exertion, dizziness, or presyncope. She will follow up with her primary concerning the chronic cough.  Allergies  Allergen Reactions  . Cardizem [Diltiazem Hcl] Swelling  . Clonidine Derivatives Swelling  . Doxazosin Swelling     Current Outpatient Prescriptions  Medication Sig Dispense Refill  . acetaminophen (TYLENOL) 325 MG tablet Take 650 mg by mouth daily as needed. For pain      . digoxin (LANOXIN) 0.125 MG tablet Take 1 tablet (0.125 mg total) by mouth daily.  90 tablet  1  . fish oil-omega-3 fatty acids 1000 MG capsule Take 1 g by mouth daily.      . furosemide (LASIX) 40 MG tablet Take 40 mg by mouth 2 (two) times daily.      Marland Kitchen losartan (COZAAR) 100 MG tablet Take 100 mg by mouth daily.      . metoprolol (LOPRESSOR) 100 MG tablet Take 1 tablet (100 mg total) by mouth 2 (two) times daily.  60 tablet  6  . Travoprost, BAK Free, (TRAVATAN) 0.004 % SOLN ophthalmic solution 1 drop as directed.      Alveda Reasons 20 MG TABS tablet TAKE ONE TABLET BY MOUTH ONCE DAILY WITH SUPPER...PATIENT NEEDS APPOINTMENT FOR FUTHER REFILLS...  30 tablet  1   No current facility-administered medications for this visit.    Past Medical History  Diagnosis Date  . Hypertension   .  Dyslipidemia   . Cancer     basal cell CA  . Arthritis     DJD  . Pulmonary HTN     mild - RVSP 40-62mmHg  . Vocal cord paralysis   . Hyperlipidemia   . Chronic anticoagulation     on Xarelto  . Diastolic dysfunction     by echo 07/2012  . Chronic atrial fibrillation     s/p failed DCCV now on rate control  . Glaucoma     Past Surgical History  Procedure Laterality Date  . Breast surgery      breast implants  . Joint replacement  2003    bilateral hip replacements  . Eye surgery  2011    bilateral cataract surgery  . Back surgery      C6-C7 discectomy  . Basal cell ca removal    . Cardioversion N/A 09/06/2012    Procedure: CARDIOVERSION;  Surgeon: [name], MD;  Location: [facility];  Service: Cardiovascular;  Laterality: N/A;  . Facial cosmetic surgery      Family History  Problem Relation Age of Onset  . Alzheimer's disease Mother     History   Social History  . Marital Status: Married    Spouse Name: N/A    Number of Children: N/A  . Years of Education: N/A   Occupational History  . Not on file.  Social History Main Topics  . Smoking status: Former Research scientist (life sciences)  . Smokeless tobacco: Not on file  . Alcohol Use: Yes     Comment: occasional wine  . Drug Use: No  . Sexual Activity: Not on file   Other Topics Concern  . Not on file   Social History Narrative  . No narrative on file    ROS: Chronic varicosities in her legs with mild swelling otherwise see history of present illness  BP 110/72  Pulse 73  Ht 5' 7.75" (1.721 m)  Wt 172 lb 9.6 oz (78.291 kg)  BMI 26.43 kg/m2  PHYSICAL EXAM: Well-nournished, in no acute distress. Neck: No JVD, HJR, Bruit, or thyroid enlargement  Lungs: No tachypnea, clear without wheezing, rales, or rhonchi  Cardiovascular: Irregular irregular, PMI not displaced, heart sounds normal, no murmurs, gallops, bruit, thrill, or heave.  Abdomen: BS normal. Soft without organomegaly, masses, lesions or  tenderness.  Extremities: Mild ankle edema with chronic varicosities laterally with some cyanosis . Decreased distal pulses bilateral  SKin: Warm, no lesions or rashes   Musculoskeletal: No deformities  Neuro: no focal signs   Wt Readings from Last 3 Encounters:  03/22/14 172 lb 9.6 oz (78.291 kg)  03/07/14 173 lb 1.9 oz (78.527 kg)  05/09/13 176 lb (79.833 kg)     EKG: Atrial fibrillation at 73 beats per minute with nonspecific ST-T wave changes, no acute change

## 2014-03-23 ENCOUNTER — Encounter: Payer: Self-pay | Admitting: Physician Assistant

## 2014-04-21 ENCOUNTER — Other Ambulatory Visit: Payer: Self-pay | Admitting: Cardiology

## 2014-05-05 ENCOUNTER — Other Ambulatory Visit: Payer: Self-pay

## 2014-05-05 ENCOUNTER — Telehealth: Payer: Self-pay

## 2014-05-05 MED ORDER — RIVAROXABAN 20 MG PO TABS: ORAL_TABLET | ORAL | Status: DC

## 2014-05-05 NOTE — Telephone Encounter (Signed)
Patient called to get an rx for xarelto While talking to her I found out she was in the doughnut hole. I told her about the free 30 day cards and about the patient assists program . I placed a card up front and gave her the phone number for xarelto path

## 2014-06-30 ENCOUNTER — Encounter: Payer: Self-pay | Admitting: Cardiology

## 2014-07-11 ENCOUNTER — Telehealth: Payer: Self-pay

## 2014-07-11 NOTE — Telephone Encounter (Signed)
Patient called to get samples of xarelto placed them up front

## 2014-07-18 ENCOUNTER — Telehealth: Payer: Self-pay | Admitting: Cardiology

## 2014-07-18 ENCOUNTER — Encounter: Payer: Self-pay | Admitting: Cardiology

## 2014-08-03 NOTE — Telephone Encounter (Signed)
error 

## 2014-08-24 ENCOUNTER — Other Ambulatory Visit: Payer: Self-pay

## 2014-08-24 MED ORDER — RIVAROXABAN 20 MG PO TABS: ORAL_TABLET | ORAL | Status: DC

## 2014-09-11 ENCOUNTER — Encounter: Payer: Self-pay | Admitting: Cardiology

## 2014-09-11 ENCOUNTER — Ambulatory Visit (INDEPENDENT_AMBULATORY_CARE_PROVIDER_SITE_OTHER): Payer: Medicare Other | Admitting: Cardiology

## 2014-09-11 VITALS — BP 132/80 | HR 99 | Ht 68.0 in | Wt 170.0 lb

## 2014-09-11 DIAGNOSIS — I10 Essential (primary) hypertension: Secondary | ICD-10-CM

## 2014-09-11 DIAGNOSIS — I272 Pulmonary hypertension, unspecified: Secondary | ICD-10-CM

## 2014-09-11 DIAGNOSIS — I482 Chronic atrial fibrillation, unspecified: Secondary | ICD-10-CM

## 2014-09-11 DIAGNOSIS — I471 Supraventricular tachycardia: Secondary | ICD-10-CM

## 2014-09-11 DIAGNOSIS — I270 Primary pulmonary hypertension: Secondary | ICD-10-CM

## 2014-09-11 LAB — CBC WITH DIFFERENTIAL/PLATELET
BASOS ABS: 0 10*3/uL (ref 0.0–0.1)
Basophils Relative: 0.3 % (ref 0.0–3.0)
EOS PCT: 0.3 % (ref 0.0–5.0)
Eosinophils Absolute: 0 10*3/uL (ref 0.0–0.7)
HCT: 48.5 % — ABNORMAL HIGH (ref 36.0–46.0)
HEMOGLOBIN: 16.5 g/dL — AB (ref 12.0–15.0)
Lymphocytes Relative: 20.4 % (ref 12.0–46.0)
Lymphs Abs: 2.1 10*3/uL (ref 0.7–4.0)
MCHC: 34.1 g/dL (ref 30.0–36.0)
MCV: 101.5 fl — ABNORMAL HIGH (ref 78.0–100.0)
Monocytes Absolute: 1.1 10*3/uL — ABNORMAL HIGH (ref 0.1–1.0)
Monocytes Relative: 10.7 % (ref 3.0–12.0)
NEUTROS ABS: 7.1 10*3/uL (ref 1.4–7.7)
NEUTROS PCT: 68.3 % (ref 43.0–77.0)
PLATELETS: 226 10*3/uL (ref 150.0–400.0)
RBC: 4.78 Mil/uL (ref 3.87–5.11)
RDW: 13.4 % (ref 11.5–15.5)
WBC: 10.4 10*3/uL (ref 4.0–10.5)

## 2014-09-11 LAB — BASIC METABOLIC PANEL
BUN: 30 mg/dL — AB (ref 6–23)
CHLORIDE: 99 meq/L (ref 96–112)
CO2: 30 mEq/L (ref 19–32)
Calcium: 10 mg/dL (ref 8.4–10.5)
Creatinine, Ser: 0.9 mg/dL (ref 0.40–1.20)
GFR: 63.99 mL/min (ref >60.00)
GLUCOSE: 91 mg/dL (ref 70–99)
Potassium: 4.7 mEq/L (ref 3.5–5.1)
Sodium: 139 mEq/L (ref 135–145)

## 2014-09-11 MED ORDER — METOPROLOL TARTRATE 100 MG PO TABS: 100.0000 mg | ORAL_TABLET | Freq: Two times a day (BID) | ORAL | Status: DC

## 2014-09-11 MED ORDER — DIGOXIN 125 MCG PO TABS: 0.1250 mg | ORAL_TABLET | Freq: Every day | ORAL | Status: DC

## 2014-09-11 NOTE — Progress Notes (Signed)
Cardiology Office Note   Date:  09/11/2014   ID:  Kelsey [name], DOB 05-07-1932, MRN [id number]  PCP:  No primary care provider on file.  Cardiologist:   [name], MD   Chief Complaint  Patient presents with  . Atrial Fibrillation  . Hypertension      History of Present Illness:Kelsey [name] is a 81 y.o. female with a history of HTN/SVT/pulmonary HTN and chronic atrial fibrillation on chronic anticoagulation. She is doing well. She denies any chest pain, SOB, DOE, dizziness, palpitations or syncope. She occasionally has some mild LE edema.    Past Medical History  Diagnosis Date  . Hypertension   . Dyslipidemia   . Cancer     basal cell CA  . Arthritis     DJD  . Pulmonary HTN     mild - RVSP 40-75mmHg  . Vocal cord paralysis   . Hyperlipidemia   . Chronic anticoagulation     on Xarelto  . Diastolic dysfunction     by echo 07/2012  . Chronic atrial fibrillation     s/p failed DCCV now on rate control  . Glaucoma     Past Surgical History  Procedure Laterality Date  . Breast surgery      breast implants  . Joint replacement  2003    bilateral hip replacements  . Eye surgery  2011    bilateral cataract surgery  . Back surgery      C6-C7 discectomy  . Basal cell ca removal    . Cardioversion N/A 09/06/2012    Procedure: CARDIOVERSION;  Surgeon: [name], MD;  Location: [facility];  Service: Cardiovascular;  Laterality: N/A;  . Facial cosmetic surgery       Current Outpatient Prescriptions  Medication Sig Dispense Refill  . acetaminophen (TYLENOL) 325 MG tablet Take 650 mg by mouth daily as needed. For pain    . digoxin (LANOXIN) 0.125 MG tablet Take 1 tablet (0.125 mg total) by mouth daily. 90 tablet 1  . fish oil-omega-3 fatty acids 1000 MG capsule Take 1 g by mouth daily.    . furosemide (LASIX) 40 MG tablet Take 40 mg by mouth 2 (two) times daily.    Marland Kitchen losartan (COZAAR) 100 MG tablet TAKE ONE TABLET BY MOUTH ONCE  DAILY 90 tablet 1  . metoprolol (LOPRESSOR) 100 MG tablet Take 1 tablet (100 mg total) by mouth 2 (two) times daily. 60 tablet 6  . rivaroxaban (XARELTO) 20 MG TABS tablet TAKE ONE TABLET BY MOUTH ONCE DAILY WITH SUPPER.. 30 tablet 6   No current facility-administered medications for this visit.    Allergies:   Cardizem; Clonidine derivatives; and Doxazosin    Social History:  The patient  reports that she has quit smoking. She does not have any smokeless tobacco history on file. She reports that she drinks alcohol. She reports that she does not use illicit drugs.   Family History:  The patient's family history includes Alzheimer's disease in her mother.    ROS:  Please see the history of present illness.   Otherwise, review of systems are positive for none.   All other systems are reviewed and negative.    PHYSICAL EXAM: VS:  BP 132/80 mmHg  Pulse 99  Ht 5\' 8"  (1.727 m)  Wt 170 lb (77.111 kg)  BMI 25.85 kg/m2  SpO2 98% , BMI Body mass index is 25.85 kg/(m^2). GEN: Well nourished, well developed, in no acute distress HEENT: normal  Neck: no JVD, carotid bruits, or masses Cardiac: RRR; no murmurs, rubs, or gallops,no edema  Respiratory:  clear to auscultation bilaterally, normal work of breathing GI: soft, nontender, nondistended, + BS MS: no deformity or atrophy Skin: warm and dry, no rash Neuro:  Strength and sensation are intact Psych: euthymic mood, full affect   EKG:  EKG is not ordered today.    Recent Labs: 03/07/2014: BUN 23; Creatinine 0.9; Potassium 4.1; Sodium 138    Lipid Panel No results found for: CHOL, TRIG, HDL, CHOLHDL, VLDL, LDLCALC, LDLDIRECT    Wt Readings from Last 3 Encounters:  09/11/14 170 lb (77.111 kg)  03/22/14 172 lb 9.6 oz (78.291 kg)  03/07/14 173 lb 1.9 oz (78.527 kg)    ASSESSMENT AND PLAN:  1. Chronic atrial fibrillation with rate controlled. - continue digoxin/Xarelto/metoprolol  - check BMET  2. Chronic anticoagulation        3.   HTN - well controlled - continue Metoprolol /digoxin 4.   H/O SVT 4. Mild pulmonary HTN PASP 29mmHg on echo 2015 - repeat echo to reassess 6. LE edema - continue lasix.     Current medicines are reviewed at length with the patient today.  The patient does not have concerns regarding medicines.  The following changes have been made:  no change  Labs/ tests ordered today include: BMET     Disposition:   FU with me in 6 months   Signed, [name], MD  09/11/2014 11:11 AM    [facility] [place], [place], [place]  [address] Phone: [phone]; Fax: [phone]

## 2014-09-11 NOTE — Patient Instructions (Addendum)
Your physician recommends that you continue on your current medications as directed. Please refer to the Current Medication list given to you today.  Your physician recommends that you have lab work TODAY (CBC, BMET)  Your physician wants you to follow-up in: 6 months with Dr. Radford Pax. You will receive a reminder letter in the mail two months in advance. If you don't receive a letter, please call our office to schedule the follow-up appointment.

## 2014-09-14 ENCOUNTER — Telehealth: Payer: Self-pay | Admitting: Cardiology

## 2014-09-14 NOTE — Telephone Encounter (Signed)
Informed pt of lab results. Pt verbalized understanding. 

## 2014-09-14 NOTE — Telephone Encounter (Signed)
New Msg       Pt returning call about lab results.    Please call back.

## 2014-10-02 ENCOUNTER — Telehealth: Payer: Self-pay | Admitting: Cardiology

## 2014-10-02 NOTE — Telephone Encounter (Signed)
Restart Xarelto and she needs to see her PCP ASAP to try to determine source of bleeding

## 2014-10-02 NOTE — Telephone Encounter (Signed)
New Message    Pt c/o medication issue:  1. Name of Medication:  xarlto   2. How are you currently taking this medication (dosage and times per day)?20mg  daily   3. Are you having a reaction (difficulty breathing--STAT)? Woke up with blood pouring out of her mouth  4. What is your medication issue? Afib

## 2014-10-02 NOTE — Telephone Encounter (Signed)
Patient states she has been on Xarelto for two years without a problem until yesterday morning. She awoke with fresh blood (moderate amount) on pillowcase and on her face. She states after waking up and moving around the bleeding stopped. Denied nosebleed or mouth bleeding. Denied sinus problems/stuffiness. No other symptoms. Only bruising is from recent procedures she had for treating glaucoma. Patient did not take Xarelto last night or today thus far. She also denies taking any recent products with ASA and denies ETOH use over past couple of days. She has not had any fresh bleeding this morning.  Routed to Dr. Radford Pax.

## 2014-10-02 NOTE — Telephone Encounter (Signed)
Advised per Dr.Turner to restart the Xarelto and to call her PCP and be seen ASAP to determine cause of bleeding. States her PCP is Dr. Harrington Challenger at [place] [facility].  She will restart Xarelto and call their office.

## 2014-10-23 ENCOUNTER — Other Ambulatory Visit: Payer: Self-pay | Admitting: Interventional Cardiology

## 2014-12-21 ENCOUNTER — Encounter: Payer: Self-pay | Admitting: Cardiology

## 2015-01-17 ENCOUNTER — Other Ambulatory Visit: Payer: Self-pay | Admitting: Interventional Cardiology

## 2015-04-03 ENCOUNTER — Other Ambulatory Visit: Payer: Self-pay | Admitting: Internal Medicine

## 2016-01-25 ENCOUNTER — Other Ambulatory Visit: Payer: Self-pay | Admitting: Interventional Cardiology

## 2016-02-26 ENCOUNTER — Other Ambulatory Visit: Payer: Self-pay | Admitting: Interventional Cardiology

## 2016-03-10 ENCOUNTER — Other Ambulatory Visit: Payer: Self-pay | Admitting: Interventional Cardiology

## 2016-03-11 ENCOUNTER — Other Ambulatory Visit: Payer: Self-pay | Admitting: Interventional Cardiology

## 2016-03-13 ENCOUNTER — Other Ambulatory Visit: Payer: Self-pay | Admitting: *Deleted

## 2016-03-13 MED ORDER — LOSARTAN POTASSIUM 100 MG PO TABS: ORAL_TABLET | ORAL | 0 refills | Status: DC

## 2016-03-18 ENCOUNTER — Other Ambulatory Visit: Payer: Self-pay | Admitting: Cardiology

## 2016-03-18 NOTE — Telephone Encounter (Signed)
losartan (COZAAR) 100 MG tablet  Medication  Date: 03/13/2016 Department: [address] Office Ordering/Authorizing: [name], MD  Order Providers   Prescribing Provider Encounter Provider  [name], MD Juventino Slovak, CMA  Medication Detail    Disp Refills Start End   losartan (COZAAR) 100 MG tablet 15 tablet 0 03/13/2016    Sig: TAKE ONE TABLET BY MOUTH ONCE DAILY **PATIENT IS OVERDUE FOR AN APPOINTMENT PLEASE CALL AND SCHEDULE FOR FURTHER REFILLS**   E-Prescribing Status: Receipt confirmed by pharmacy (03/13/2016 10:06 AM EDT)   Pharmacy   [facility] [place], [place] - [address]

## 2016-09-11 DIAGNOSIS — M25552 Pain in left hip: Secondary | ICD-10-CM | POA: Diagnosis not present

## 2016-09-11 DIAGNOSIS — M7061 Trochanteric bursitis, right hip: Secondary | ICD-10-CM | POA: Diagnosis not present

## 2016-09-11 DIAGNOSIS — M7062 Trochanteric bursitis, left hip: Secondary | ICD-10-CM | POA: Diagnosis not present

## 2016-09-11 DIAGNOSIS — M25551 Pain in right hip: Secondary | ICD-10-CM | POA: Diagnosis not present

## 2016-11-06 DIAGNOSIS — Z9889 Other specified postprocedural states: Secondary | ICD-10-CM | POA: Diagnosis not present

## 2016-11-06 DIAGNOSIS — E785 Hyperlipidemia, unspecified: Secondary | ICD-10-CM | POA: Diagnosis not present

## 2016-11-06 DIAGNOSIS — I480 Paroxysmal atrial fibrillation: Secondary | ICD-10-CM | POA: Diagnosis not present

## 2016-11-06 DIAGNOSIS — I10 Essential (primary) hypertension: Secondary | ICD-10-CM | POA: Diagnosis not present

## 2016-11-06 DIAGNOSIS — Z8679 Personal history of other diseases of the circulatory system: Secondary | ICD-10-CM | POA: Diagnosis not present

## 2016-12-31 DIAGNOSIS — M8580 Other specified disorders of bone density and structure, unspecified site: Secondary | ICD-10-CM | POA: Diagnosis not present

## 2016-12-31 DIAGNOSIS — M5414 Radiculopathy, thoracic region: Secondary | ICD-10-CM | POA: Diagnosis not present

## 2016-12-31 DIAGNOSIS — M5416 Radiculopathy, lumbar region: Secondary | ICD-10-CM | POA: Diagnosis not present

## 2016-12-31 DIAGNOSIS — M4120 Other idiopathic scoliosis, site unspecified: Secondary | ICD-10-CM | POA: Diagnosis not present

## 2017-01-02 ENCOUNTER — Other Ambulatory Visit: Payer: Self-pay | Admitting: Neurosurgery

## 2017-01-02 DIAGNOSIS — M8580 Other specified disorders of bone density and structure, unspecified site: Secondary | ICD-10-CM

## 2017-01-09 ENCOUNTER — Ambulatory Visit
Admission: RE | Admit: 2017-01-09 | Discharge: 2017-01-09 | Disposition: A | Discharge status: Home / Self Care | Payer: Medicare HMO | Source: Ambulatory Visit | Attending: Neurosurgery | Admitting: Neurosurgery

## 2017-01-09 DIAGNOSIS — Z13820 Encounter for screening for osteoporosis: Secondary | ICD-10-CM | POA: Diagnosis not present

## 2017-01-09 DIAGNOSIS — M8580 Other specified disorders of bone density and structure, unspecified site: Secondary | ICD-10-CM

## 2017-01-09 DIAGNOSIS — Z780 Asymptomatic menopausal state: Secondary | ICD-10-CM | POA: Diagnosis not present

## 2017-01-13 DIAGNOSIS — I10 Essential (primary) hypertension: Secondary | ICD-10-CM | POA: Diagnosis not present

## 2017-01-13 DIAGNOSIS — Z8679 Personal history of other diseases of the circulatory system: Secondary | ICD-10-CM | POA: Diagnosis not present

## 2017-01-13 DIAGNOSIS — E782 Mixed hyperlipidemia: Secondary | ICD-10-CM | POA: Diagnosis not present

## 2017-01-15 DIAGNOSIS — M5126 Other intervertebral disc displacement, lumbar region: Secondary | ICD-10-CM | POA: Diagnosis not present

## 2017-01-15 DIAGNOSIS — M4126 Other idiopathic scoliosis, lumbar region: Secondary | ICD-10-CM | POA: Diagnosis not present

## 2017-01-15 DIAGNOSIS — R531 Weakness: Secondary | ICD-10-CM | POA: Diagnosis not present

## 2017-01-15 DIAGNOSIS — Z6825 Body mass index (BMI) 25.0-25.9, adult: Secondary | ICD-10-CM | POA: Diagnosis not present

## 2017-01-15 DIAGNOSIS — M5416 Radiculopathy, lumbar region: Secondary | ICD-10-CM | POA: Diagnosis not present

## 2017-01-15 DIAGNOSIS — M4802 Spinal stenosis, cervical region: Secondary | ICD-10-CM | POA: Diagnosis not present

## 2017-01-15 DIAGNOSIS — I10 Essential (primary) hypertension: Secondary | ICD-10-CM | POA: Diagnosis not present

## 2017-01-15 DIAGNOSIS — M545 Low back pain: Secondary | ICD-10-CM | POA: Diagnosis not present

## 2017-01-15 DIAGNOSIS — M5414 Radiculopathy, thoracic region: Secondary | ICD-10-CM | POA: Diagnosis not present

## 2017-01-15 DIAGNOSIS — G710 Muscular dystrophy: Secondary | ICD-10-CM | POA: Diagnosis not present

## 2017-01-19 DIAGNOSIS — E782 Mixed hyperlipidemia: Secondary | ICD-10-CM | POA: Diagnosis not present

## 2017-01-19 DIAGNOSIS — J449 Chronic obstructive pulmonary disease, unspecified: Secondary | ICD-10-CM | POA: Diagnosis not present

## 2017-01-19 DIAGNOSIS — R609 Edema, unspecified: Secondary | ICD-10-CM | POA: Diagnosis not present

## 2017-01-19 DIAGNOSIS — Z0000 Encounter for general adult medical examination without abnormal findings: Secondary | ICD-10-CM | POA: Diagnosis not present

## 2017-01-19 DIAGNOSIS — I4891 Unspecified atrial fibrillation: Secondary | ICD-10-CM | POA: Diagnosis not present

## 2017-01-20 ENCOUNTER — Encounter: Payer: Self-pay | Admitting: Neurology

## 2017-01-26 DIAGNOSIS — R5383 Other fatigue: Secondary | ICD-10-CM | POA: Diagnosis not present

## 2017-01-26 DIAGNOSIS — I480 Paroxysmal atrial fibrillation: Secondary | ICD-10-CM | POA: Diagnosis not present

## 2017-01-26 DIAGNOSIS — R600 Localized edema: Secondary | ICD-10-CM | POA: Diagnosis not present

## 2017-01-26 DIAGNOSIS — E785 Hyperlipidemia, unspecified: Secondary | ICD-10-CM | POA: Diagnosis not present

## 2017-01-26 DIAGNOSIS — I10 Essential (primary) hypertension: Secondary | ICD-10-CM | POA: Diagnosis not present

## 2017-01-26 DIAGNOSIS — Z9889 Other specified postprocedural states: Secondary | ICD-10-CM | POA: Diagnosis not present

## 2017-01-26 DIAGNOSIS — Z8679 Personal history of other diseases of the circulatory system: Secondary | ICD-10-CM | POA: Diagnosis not present

## 2017-01-26 DIAGNOSIS — H409 Unspecified glaucoma: Secondary | ICD-10-CM | POA: Diagnosis not present

## 2017-02-06 DIAGNOSIS — M4800 Spinal stenosis, site unspecified: Secondary | ICD-10-CM | POA: Diagnosis not present

## 2017-02-06 DIAGNOSIS — B353 Tinea pedis: Secondary | ICD-10-CM | POA: Diagnosis not present

## 2017-02-20 DIAGNOSIS — R21 Rash and other nonspecific skin eruption: Secondary | ICD-10-CM | POA: Diagnosis not present

## 2017-04-02 DIAGNOSIS — L821 Other seborrheic keratosis: Secondary | ICD-10-CM | POA: Diagnosis not present

## 2017-04-02 DIAGNOSIS — B351 Tinea unguium: Secondary | ICD-10-CM | POA: Diagnosis not present

## 2017-04-02 DIAGNOSIS — B353 Tinea pedis: Secondary | ICD-10-CM | POA: Diagnosis not present

## 2017-05-04 ENCOUNTER — Encounter: Payer: Self-pay | Admitting: Neurology

## 2017-05-04 ENCOUNTER — Other Ambulatory Visit (INDEPENDENT_AMBULATORY_CARE_PROVIDER_SITE_OTHER): Payer: Medicare HMO

## 2017-05-04 ENCOUNTER — Ambulatory Visit (INDEPENDENT_AMBULATORY_CARE_PROVIDER_SITE_OTHER): Payer: Medicare HMO | Admitting: Neurology

## 2017-05-04 VITALS — BP 128/84 | HR 88 | Ht 68.0 in | Wt 167.4 lb

## 2017-05-04 DIAGNOSIS — R69 Illness, unspecified: Secondary | ICD-10-CM | POA: Diagnosis not present

## 2017-05-04 DIAGNOSIS — M6281 Muscle weakness (generalized): Secondary | ICD-10-CM | POA: Diagnosis not present

## 2017-05-04 LAB — T4, FREE: FREE T4: 1.3 ng/dL (ref 0.60–1.60)

## 2017-05-04 LAB — CK: CK TOTAL: 23 U/L (ref 7–177)

## 2017-05-04 LAB — TSH: TSH: 0.78 u[IU]/mL (ref 0.35–4.50)

## 2017-05-04 NOTE — Progress Notes (Signed)
NEUROLOGY CONSULTATION NOTE  SHAKENA CALLARI MRN: [id number] DOB: Jul 15, 1932  Referring provider: Dr. Vertell Limber Primary care provider: Dr. Harrington Challenger  Reason for consult:  Weakness  HISTORY OF PRESENT ILLNESS: Kelsey [name] is an 83 year old right-handed female with atrial fibrillation who presents for muscular dystrophy.  She is accompanied by her husband who supplements history.  For about 2 years, she endorses generalized weakness with difficulty walking, standing up from a chair or walking up  steps.  She has had several falls.  She feels weak in the arms and hands as well.  She denies double vision, droopy eyelids, trouble swallowing or shortness of breath.  She has remote history of cervical spine surgery in the 1970s.  She has bilateral hip replacements performed in 2003.  She has a history of back pain for which she sees Dr. Vertell Limber at [place].  She reports occasional bowel incontinence with loose stool.  She denies bladder dysfunction.  She underwent imaging on 01/15/17.  MRI of thoracic spine showed chronic compression fracture of the T11 vertebral body and hyperintense T2 signal within the cord at T8-T12 indicating chronic compressive myelopathy but no cord compression.  She denies history of spine injury causing myelopathy.  MRI of lumbar spine demonstrates L1-2 disc herniation with severe spinal canal and bilateral foraminal stenosis as well as multilevel severe facet arthrosis with neural foraminal stenosis.  MRI also reportedly demonstrated lack of posterior spinal musculature which Dr. Vertell Limber believed to be greater than atrophy alone.  Underlying muscular dystrophy or myositis has been considered.  PAST MEDICAL HISTORY: Past Medical History:  Diagnosis Date  . Arthritis    DJD  . Cancer    basal cell CA  . Chronic anticoagulation    on Xarelto  . Chronic atrial fibrillation    s/p failed DCCV now on rate control  . Diastolic dysfunction    by  echo 07/2012  . Dyslipidemia   . Glaucoma   . Hyperlipidemia   . Hypertension   . Pulmonary HTN    mild - RVSP 40-80mmHg  . Vocal cord paralysis     PAST SURGICAL HISTORY: Past Surgical History:  Procedure Laterality Date  . BACK SURGERY     C6-C7 discectomy  . basal cell CA removal    . BREAST SURGERY     breast implants  . CARDIOVERSION N/A 09/06/2012   Procedure: CARDIOVERSION;  Surgeon: [name], MD;  Location: [facility];  Service: Cardiovascular;  Laterality: N/A;  . EYE SURGERY  2011   bilateral cataract surgery  . FACIAL COSMETIC SURGERY    . JOINT REPLACEMENT  2003   bilateral hip replacements    MEDICATIONS: Current Outpatient Prescriptions on File Prior to Visit  Medication Sig Dispense Refill  . acetaminophen (TYLENOL) 325 MG tablet Take 650 mg by mouth daily as needed. For pain    . digoxin (LANOXIN) 0.125 MG tablet Take 1 tablet (0.125 mg total) by mouth daily. (Patient not taking: Reported on 05/04/2017) 90 tablet 2  . fish oil-omega-3 fatty acids 1000 MG capsule Take 1 g by mouth daily.    . furosemide (LASIX) 40 MG tablet Take 40 mg by mouth 2 (two) times daily.    Marland Kitchen losartan (COZAAR) 100 MG tablet TAKE ONE TABLET BY MOUTH ONCE DAILY **PATIENT IS OVERDUE FOR AN APPOINTMENT PLEASE CALL AND SCHEDULE FOR FURTHER REFILLS** 15 tablet 0  . metoprolol (LOPRESSOR) 100 MG tablet Take 1 tablet (100 mg total) by mouth  2 (two) times daily. 180 tablet 3  . XARELTO 20 MG TABS tablet TAKE ONE TABLET BY MOUTH ONCE DAILY WITH SUPPER 30 tablet 0   No current facility-administered medications on file prior to visit.     ALLERGIES: Allergies  Allergen Reactions  . Cardizem [Diltiazem Hcl] Swelling  . Clonidine Derivatives Swelling  . Doxazosin Swelling    FAMILY HISTORY: Family History  Problem Relation Age of Onset  . Alzheimer's disease Mother     SOCIAL HISTORY: Social History   Social History  . Marital status: Married    Spouse name: N/A  . Number  of children: N/A  . Years of education: N/A   Occupational History  . Not on file.   Social History Main Topics  . Smoking status: Former Research scientist (life sciences)  . Smokeless tobacco: Not on file  . Alcohol use Yes     Comment: occasional wine  . Drug use: No  . Sexual activity: Not on file   Other Topics Concern  . Not on file   Social History Narrative  . No narrative on file    REVIEW OF SYSTEMS: Constitutional: No fevers, chills, or sweats, no generalized fatigue, change in appetite Eyes: No visual changes, double vision, eye pain Ear, nose and throat: No hearing loss, ear pain, nasal congestion, sore throat Cardiovascular: No chest pain, palpitations Respiratory:  No shortness of breath at rest or with exertion, wheezes GastrointestinaI: No nausea, vomiting, diarrhea, abdominal pain, fecal incontinence Genitourinary:  No dysuria, urinary retention or frequency Musculoskeletal:  No neck pain, back pain Integumentary: No rash, pruritus, skin lesions Neurological: as above Psychiatric: No depression, insomnia, anxiety Endocrine: No palpitations, fatigue, diaphoresis, mood swings, change in appetite, change in weight, increased thirst Hematologic/Lymphatic:  No purpura, petechiae. Allergic/Immunologic: no itchy/runny eyes, nasal congestion, recent allergic reactions, rashes  PHYSICAL EXAM: Vitals:   05/04/17 1430  BP: 128/84  Pulse: 88  SpO2: 91%   General: No acute distress.  Patient appears well-groomed.  Head:  Normocephalic/atraumatic Eyes:  fundi examined but not visualized Neck: supple, no paraspinal tenderness, full range of motion Back: No paraspinal tenderness Heart: regular rate and rhythm Lungs: Clear to auscultation bilaterally. Vascular: No carotid bruits. Neurological Exam: Mental status: alert and oriented to person, place, and time, recent and remote memory intact, fund of knowledge intact, attention and concentration intact, speech fluent and not dysarthric,  language intact. Cranial nerves: CN I: not tested CN II: pupils equal, round and reactive to light, visual fields intact CN III, IV, VI:  full range of motion, no nystagmus, no ptosis CN V: facial sensation intact CN VII: upper and lower face symmetric CN VIII: hearing intact CN IX, X: gag intact, uvula midline CN XI: sternocleidomastoid and trapezius muscles intact CN XII: tongue midline Bulk & Tone: normal, no fasciculations. Motor:  3+/5 right deltoid, 4+/5 left deltoid, 4+ bilateral triceps, 3+/5 APB bilaterally and interossei, 4+/5 hip flexors, quads and plantarflexion.  Otherwise, 5/5. Sensation:  Pinprick sensation intact and vibration sensation reduced in lower extremities. Deep Tendon Reflexes:  2+ throughout, toes downgoing.  Finger to nose testing:  Without dysmetria.  Heel to shin:  Without dysmetria.   Gait:  Wide based gait.  Able to turn but not tandem walk. Romberg with mild sway.  IMPRESSION: Diffuse weakness and gait unsteady gait.  Unclear etiology.  With involvement of the upper extremities, a purely thoracic or lumbar spinal etiology would be unlikely.  Myopathy is possible.  A cervical spinal lesion (such as stenosis)  is possible although she does not demonstrate myelopathic signs on exam.    PLAN: 1.  We will first check CK and TSH, free T4 followed by NCV-EMG.  Further recommendations pending results (such as need for MRI of cervical spine) 2.  Follow up after testing.  Ultimately, she would need physical therapy  Thank you for allowing me to take part in the care of this patient.  Metta Clines, DO  CC:  C. Melinda Crutch, MD  Erline Levine, MD

## 2017-05-04 NOTE — Patient Instructions (Signed)
1.  First we will check for any muscle problems.  I will check a CK level in your blood (which may indicate muscle damage) and then a nerve conduction study/EMG to test the nerves and muscles. 2.  Follow up after testing.  Further recommendations pending above tests.

## 2017-05-06 ENCOUNTER — Telehealth: Payer: Self-pay

## 2017-05-06 NOTE — Telephone Encounter (Signed)
-----   Message from Pieter Partridge, DO sent at 05/04/2017 10:17 PM EDT ----- Labs are normal

## 2017-05-06 NOTE — Telephone Encounter (Signed)
Called Pt, advsd of lab results

## 2017-05-11 ENCOUNTER — Other Ambulatory Visit: Payer: Self-pay

## 2017-05-11 DIAGNOSIS — I739 Peripheral vascular disease, unspecified: Secondary | ICD-10-CM | POA: Diagnosis not present

## 2017-05-11 DIAGNOSIS — I10 Essential (primary) hypertension: Secondary | ICD-10-CM | POA: Diagnosis not present

## 2017-05-11 DIAGNOSIS — M4126 Other idiopathic scoliosis, lumbar region: Secondary | ICD-10-CM | POA: Diagnosis not present

## 2017-05-11 DIAGNOSIS — M7989 Other specified soft tissue disorders: Secondary | ICD-10-CM

## 2017-05-11 DIAGNOSIS — Z6825 Body mass index (BMI) 25.0-25.9, adult: Secondary | ICD-10-CM | POA: Diagnosis not present

## 2017-05-11 DIAGNOSIS — M4804 Spinal stenosis, thoracic region: Secondary | ICD-10-CM | POA: Diagnosis not present

## 2017-05-11 DIAGNOSIS — G7100 Muscular dystrophy, unspecified: Secondary | ICD-10-CM | POA: Diagnosis not present

## 2017-05-12 ENCOUNTER — Ambulatory Visit (HOSPITAL_COMMUNITY)
Admission: RE | Admit: 2017-05-12 | Discharge: 2017-05-12 | Disposition: A | Discharge status: Home / Self Care | Payer: Medicare HMO | Source: Ambulatory Visit | Attending: Vascular Surgery | Admitting: Vascular Surgery

## 2017-05-12 DIAGNOSIS — M7989 Other specified soft tissue disorders: Secondary | ICD-10-CM | POA: Diagnosis not present

## 2017-05-13 ENCOUNTER — Encounter: Payer: Self-pay | Admitting: Vascular Surgery

## 2017-05-13 ENCOUNTER — Ambulatory Visit (INDEPENDENT_AMBULATORY_CARE_PROVIDER_SITE_OTHER): Payer: Medicare HMO | Admitting: Vascular Surgery

## 2017-05-13 VITALS — BP 120/78 | HR 103 | Temp 97.6°F | Resp 18 | Ht 68.0 in | Wt 169.0 lb

## 2017-05-13 DIAGNOSIS — I872 Venous insufficiency (chronic) (peripheral): Secondary | ICD-10-CM

## 2017-05-13 NOTE — Progress Notes (Signed)
Patient ID: Kelsey [name], female   DOB: Jul 23, 1931, 83 y.o.   MRN: [id number]  Reason for Consult: New Patient (Initial Visit) (left leg swollen and bruising)   Referred by Erline Levine, MD  Subjective:     HPI:  Kelsey [name] is a 83 y.o. female with history of atrial fibrillation on Xarelto and also has lower extremity pain that is beginning in her back and radiating and she also has decreased strength with decreased muscle definition in her spine.  She has purple discoloration of her both feet left greater than right and also has swelling mostly on the left side.  She recently had a fungal infection and has been applying antifungal ointment which has helped with her left foot.  Her toes do not bother her she was able to walk limited mostly by her back pain she says.  She is also has difficulty elevating her legs given her back pain.  She has worn compression stockings in the past but has difficulty putting them on and has not worn them recently.  She does not take an aspirin or statin drug she is on anticoagulation for atrial fibrillation.  Past Medical History:  Diagnosis Date  . Arthritis    DJD  . Cancer ([place])    basal cell CA  . Chronic anticoagulation    on Xarelto  . Chronic atrial fibrillation (HCC)    s/p failed DCCV now on rate control  . Diastolic dysfunction    by echo 07/2012  . Dyslipidemia   . Glaucoma   . Hyperlipidemia   . Hypertension   . Pulmonary HTN (HCC)    mild - RVSP 40-40mmHg  . Vocal cord paralysis    Family History  Problem Relation Age of Onset  . Alzheimer's disease Mother    Past Surgical History:  Procedure Laterality Date  . BACK SURGERY     C6-C7 discectomy  . basal cell CA removal    . BREAST SURGERY     breast implants  . CARDIOVERSION N/A 09/06/2012   Procedure: CARDIOVERSION;  Surgeon: [name], MD;  Location: [facility];  Service: Cardiovascular;  Laterality: N/A;  . EYE SURGERY  2011   bilateral  cataract surgery  . FACIAL COSMETIC SURGERY    . JOINT REPLACEMENT  2003   bilateral hip replacements    Short Social History:  Social History  Substance Use Topics  . Smoking status: Former Research scientist (life sciences)  . Smokeless tobacco: Never Used  . Alcohol use Yes     Comment: occasional wine    Allergies  Allergen Reactions  . Cardizem [Diltiazem Hcl] Swelling  . Clonidine Derivatives Swelling  . Doxazosin Swelling    Current Outpatient Prescriptions  Medication Sig Dispense Refill  . acetaminophen (TYLENOL) 325 MG tablet Take 650 mg by mouth daily as needed. For pain    . amiodarone (PACERONE) 200 MG tablet Take 200 mg by mouth daily.    . digoxin (LANOXIN) 0.125 MG tablet Take 1 tablet (0.125 mg total) by mouth daily. 90 tablet 2  . fish oil-omega-3 fatty acids 1000 MG capsule Take 1 g by mouth daily.    . furosemide (LASIX) 40 MG tablet Take 40 mg by mouth 2 (two) times daily.    Marland Kitchen losartan (COZAAR) 100 MG tablet TAKE ONE TABLET BY MOUTH ONCE DAILY **PATIENT IS OVERDUE FOR AN APPOINTMENT PLEASE CALL AND SCHEDULE FOR FURTHER REFILLS** 15 tablet 0  . metoprolol (LOPRESSOR) 100 MG tablet Take 1 tablet (100  mg total) by mouth 2 (two) times daily. 180 tablet 3  . XARELTO 20 MG TABS tablet TAKE ONE TABLET BY MOUTH ONCE DAILY WITH SUPPER 30 tablet 0   No current facility-administered medications for this visit.     Review of Systems  Constitutional:  Constitutional negative. HENT: HENT negative.  Eyes: Eyes negative.  Respiratory: Respiratory negative.  Cardiovascular: Positive for irregular heartbeat and leg swelling.  GI: Gastrointestinal negative.  Musculoskeletal: Positive for back pain, gait problem and leg pain.  Hematologic: Hematologic/lymphatic negative.  Psychiatric: Psychiatric negative.        Objective:  Objective   Vitals:   05/13/17 1547  BP: 120/78  Pulse: (!) 103  Resp: 18  Temp: 97.6 F (36.4 C)  SpO2: 100%  Weight: 169 lb (76.7 kg)  Height: 5\' 8"  (1.727  m)   Body mass index is 25.7 kg/m.  Physical Exam  Constitutional: She is oriented to person, place, and time. She appears well-developed.  HENT:  Head: Normocephalic.  Eyes: Pupils are equal, round, and reactive to light.  Neck: Normal range of motion.  Cardiovascular: Normal rate.   Pulses:      Femoral pulses are 2+ on the right side, and 2+ on the left side. Strong dp/pt bilaterally Bilateral brisk cap refill  Abdominal: Soft. She exhibits no mass.  Musculoskeletal: Normal range of motion. She exhibits edema.  Neurological: She is alert and oriented to person, place, and time.  Skin:  Area on left foot of dryness without erythema  Psychiatric: She has a normal mood and affect. Her behavior is normal. Judgment and thought content normal.    Data: I have independently interpreted her lower extremity venous duplex which demonstrates no evidence of deep or superficial thrombosis and venous incompetence on the left noted only in the left common femoral vein.     Assessment/Plan:     83 year old female for evaluation leg swelling and purple discoloration particularly left leg she has not had significant reflux no thrombosis to suggest any emergent underlying cause and has good doppler signals to suggest she does not have underlying arterial etiology either.  This standpoint given her information regarding compression hose and the timing to wear them.  She demonstrates good understanding and can follow-up on a as needed basis.     [name] MD [facility]

## 2017-05-21 ENCOUNTER — Ambulatory Visit (INDEPENDENT_AMBULATORY_CARE_PROVIDER_SITE_OTHER): Payer: Medicare HMO | Admitting: Neurology

## 2017-05-21 DIAGNOSIS — M6281 Muscle weakness (generalized): Secondary | ICD-10-CM | POA: Diagnosis not present

## 2017-05-21 DIAGNOSIS — G5601 Carpal tunnel syndrome, right upper limb: Secondary | ICD-10-CM

## 2017-05-21 DIAGNOSIS — M5417 Radiculopathy, lumbosacral region: Secondary | ICD-10-CM

## 2017-05-21 NOTE — Procedures (Signed)
[facility] Neurology  [address]  [place], [place] [address] Tel: [phone] Fax:  [phone] Test Date:  05/21/2017  Patient: Kelsey [name] DOB: [birth year] Physician: Narda Amber, DO  Sex: Female Height: 5\' 8"  Ref Phys: Metta Clines, D.O.  ID#: [id number] Temp: 32.5C Technician:    Patient Complaints: This is a 83 year old female referred for evaluation of generalized muscle weakness.  NCV & EMG Findings: Extensive electrodiagnostic testing of the right upper and lower extremity shows:  1. Right median sensory response is absent. Right ulnar sensory response is within normal limits. 2. Right sural and superficial peroneal sensory responses are absent, which can be normal for patient's age. 3. Right median motor response shows prolonged latency and reduced amplitude. Of note, there is evidence of anomalous innervation to the abductor pollicis brevis, as noted by motor response when stimulating at the ulnar wrist, consistent with a Martin-Gruber anastomosis. Right ulnar motor response is within normal limits. 4. Right peroneal motor response shows reduced amplitude at the extensor digitorum brevis and tibialis anterior. Right tibial motor responses within normal limits. 5. Right tibial H reflex study is within normal limits. 6. Small and complex motor units are seen in the rectus femoris, pronator teres, and triceps muscles. There is no evidence of active denervation. Chronic motor axon loss changes were seen on the muscles of the lower extremity and right abductor pollicis brevis muscles.  Due to patient being on anticoagulation therapy, proximal and deep muscles were not sampled.  Impression: Complex study. Findings are as follows: 1. Chronic multilevel radiculopathies affecting L4-S1 nerve root/levels on the right. 2. There are subtle findings of a proximal non-necrotizing myopathy affecting the right side.   3. Sensory responses are absent in the leg and  can be normal finding for patient's age. Correlate clinically, if peripheral neuropathy is suspected.  4. Right median neuropathy at or distal to the wrist, consistent with the clinical diagnosis of carpal tunnel syndrome. Overall, these findings are severe in degree electrically. 5. Incidentally, there is a right Martin-Gruber anastomosis, a normal variant.   ___________________________ Narda Amber, DO    Nerve Conduction Studies Anti Sensory Summary Table   Site NR Peak (ms) Norm Peak (ms) P-T Amp (V) Norm P-T Amp  Right Median Anti Sensory (2nd Digit)  32.5C  Wrist NR  <3.8  >10  Right Sup Peroneal Anti Sensory (Ant Lat Mall)  12 cm NR  <4.6  >3  Right Sural Anti Sensory (Lat Mall)  32.5C  Calf NR  <4.6  >3  Right Ulnar Anti Sensory (5th Digit)  32.5C  Wrist    3.0 <3.2 16.6 >5   Motor Summary Table   Site NR Onset (ms) Norm Onset (ms) O-P Amp (mV) Norm O-P Amp Site1 Site2 Delta-0 (ms) Dist (cm) Vel (m/s) Norm Vel (m/s)  Right Median Motor (Abd Poll Brev)  32.5C  Wrist    5.2 <4.0 3.0 >5 Elbow Wrist 4.0 26.0 65 >50  Elbow    9.2  2.8  Ulnar-wrist crossover Elbow 5.5 0.0    Ulnar-wrist crossover    3.7  3.1  Post-exercise Ulnar-wrist crossover 0.9 0.0    Post-exercise    4.6  3.1         Right Peroneal Motor (Ext Dig Brev)  Ankle    2.7 <6.0 1.7 >2.5 B Fib Ankle 8.5 40.0 47 >40  B Fib    11.2  1.6  Poplt B Fib 1.6 8.0 50 >40  Poplt  12.8  1.6         Right Peroneal TA Motor (Tib Ant)  32.5C  Fib Head    3.2 <4.5 2.2 >3 Poplit Fib Head 1.2 8.0 67 >40  Poplit    4.4  2.2         Pot-exercise    3.9  2.3         Right Tibial Motor (Abd Hall Brev)  32.5C  Ankle    3.4 <6.0 4.5 >4 Knee Ankle 8.6 41.0 48 >40  Knee    12.0  3.9         Right Ulnar Motor (Abd Dig Minimi)  32.5C  Wrist    2.0 <3.1 8.4 >7 B Elbow Wrist 4.3 25.0 58 >50  B Elbow    6.3  7.8  A Elbow B Elbow 1.7 10.0 59 >50  A Elbow    8.0  7.5          H Reflex Studies   NR H-Lat (ms) Lat Norm (ms)  L-R H-Lat (ms)  Right Tibial (Gastroc)  32.5C     34.29 <35    EMG   Side Muscle Ins Act Fibs Psw Fasc Number Recrt Dur Dur. Amp Amp. Poly Poly. Comment  Right AntTibialis Nml Nml Nml Nml 1- Rapid Some 1+ Some 1+ Nml Nml N/A  Right Gastroc Nml Nml Nml Nml 1- Rapid Some 1+ Some 1+ Nml Nml N/A  Right RectFemoris Nml Nml Nml Nml 1+ Rapid Some 1+ Some 1- Some 1+ E.R.  Right BicepsFemS Nml Nml Nml Nml 1- Rapid Some 1+ Some 1+ Nml Nml N/A  Right 1stDorInt Nml Nml Nml Nml Nml Nml Nml Nml Nml Nml Nml Nml N/A  Right Abd Poll Brev Nml Nml Nml Nml 2- Rapid Some 1+ Some 1+ Some 1+ N/A  Right PronatorTeres Nml Nml Nml Nml 1+ Nml Few 1+ Few 1- Few 1+ N/A  Right Biceps Nml Nml Nml Nml Nml Nml Nml Nml Nml Nml Nml Nml N/A  Right Triceps Nml Nml Nml Nml 1+ Nml Some 1+ Some 1- Some 1+ N/A  Right Deltoid Nml Nml Nml Nml Nml Nml Nml Nml Nml Nml Nml Nml N/A      Waveforms:

## 2017-05-26 ENCOUNTER — Telehealth: Payer: Self-pay | Admitting: Neurology

## 2017-05-26 NOTE — Telephone Encounter (Signed)
patient states that she is returning Dr Tomi Likens call from yesterday

## 2017-05-28 NOTE — Telephone Encounter (Signed)
Pt spoke with Dr. Tomi Likens

## 2017-06-03 NOTE — Progress Notes (Signed)
Faxed Demo, ins card, OV note, EMG report, and Dr Starr Lake recommendation for biopsy to [facility].

## 2017-06-04 DIAGNOSIS — I8310 Varicose veins of unspecified lower extremity with inflammation: Secondary | ICD-10-CM | POA: Diagnosis not present

## 2017-06-04 DIAGNOSIS — M5416 Radiculopathy, lumbar region: Secondary | ICD-10-CM | POA: Diagnosis not present

## 2017-06-04 DIAGNOSIS — L97909 Non-pressure chronic ulcer of unspecified part of unspecified lower leg with unspecified severity: Secondary | ICD-10-CM | POA: Diagnosis not present

## 2017-06-04 DIAGNOSIS — H6121 Impacted cerumen, right ear: Secondary | ICD-10-CM | POA: Diagnosis not present

## 2017-06-04 DIAGNOSIS — L0390 Cellulitis, unspecified: Secondary | ICD-10-CM | POA: Diagnosis not present

## 2017-06-10 DIAGNOSIS — L97919 Non-pressure chronic ulcer of unspecified part of right lower leg with unspecified severity: Secondary | ICD-10-CM | POA: Diagnosis not present

## 2017-06-10 DIAGNOSIS — R609 Edema, unspecified: Secondary | ICD-10-CM | POA: Diagnosis not present

## 2017-06-16 DIAGNOSIS — L97919 Non-pressure chronic ulcer of unspecified part of right lower leg with unspecified severity: Secondary | ICD-10-CM | POA: Diagnosis not present

## 2017-06-18 ENCOUNTER — Telehealth: Payer: Self-pay

## 2017-06-18 NOTE — Telephone Encounter (Signed)
[facility] to inquire about referral I faxed to them on 06/03/17 requesting consult for biopsy- Pt is scheduled to see Dr Wilburn Mylar on 06/25/17

## 2017-06-22 DIAGNOSIS — R609 Edema, unspecified: Secondary | ICD-10-CM | POA: Diagnosis not present

## 2017-06-22 DIAGNOSIS — L97919 Non-pressure chronic ulcer of unspecified part of right lower leg with unspecified severity: Secondary | ICD-10-CM | POA: Diagnosis not present

## 2017-07-01 DIAGNOSIS — L97919 Non-pressure chronic ulcer of unspecified part of right lower leg with unspecified severity: Secondary | ICD-10-CM | POA: Diagnosis not present

## 2017-07-01 DIAGNOSIS — M2550 Pain in unspecified joint: Secondary | ICD-10-CM | POA: Diagnosis not present

## 2017-07-02 ENCOUNTER — Ambulatory Visit: Payer: Self-pay | Admitting: General Surgery

## 2017-07-02 DIAGNOSIS — M6281 Muscle weakness (generalized): Secondary | ICD-10-CM | POA: Diagnosis not present

## 2017-07-02 NOTE — H&P (Signed)
History of Present Illness [name] MD; 07/02/2017 12:21 PM) The patient is a 83 year old female who presents with a complaint of Muscle weakness. Referred by: Dr. Metta Clines Chief Complaint: Muscle weakness  Patient is a 83 year old female with a less than one-year history of muscle weakness. She states this is worse in the lower extremities in the upper extremity, she states that she has trouble rising out of a seat and standing. Patient has been worked up by Dr. Vertell Limber as well as Dr. Tomi Likens.  Patient sees Dr. Cheryl Flash as her cardiologist. She has a history of A. fib and is on Xarelto.     Past Surgical History Alean Rinne, [place]; 07/02/2017 11:23 AM) Breast Augmentation  Bilateral. Cataract Surgery  Bilateral. Hip Surgery  Bilateral. Tonsillectomy   Diagnostic Studies History Alean Rinne, [place]; 07/02/2017 11:23 AM) Colonoscopy  5-10 years ago Mammogram  >3 years ago Pap Smear  >5 years ago  Allergies Alean Rinne, RMA; 07/02/2017 11:26 AM) No Known Drug Allergies 07/02/2017 (Marked as Inactive) Allergies Reconciled  Cardizem *CALCIUM CHANNEL BLOCKERS*  CloNIDine *ANTIHYPERTENSIVES*  Doxazosin Mesylate *ANTIHYPERTENSIVES*   Medication History Alean Rinne, RMA; 07/02/2017 11:29 AM) Cozaar (100MG  Tablet, Oral) Active. Lopressor (100MG  Tablet, Oral) Active. Amiodarone HCl (200MG  Tablet, Oral) Active. Digoxin (0.1MG  Capsule, Oral) Active. Fish Oil Omega-3 (1000MG  Capsule, Oral) Active. Lasix (40MG  Tablet, Oral) Active. Xarelto (20MG  Tablet, Oral) Active. Medications Reconciled  Social History Alean Rinne, [place]; 07/02/2017 11:23 AM) Alcohol use  Moderate alcohol use. Caffeine use  Coffee. No drug use  Tobacco use  Former smoker.  Family History Alean Rinne, [place]; 07/02/2017 11:23 AM) Alcohol Abuse  Father. Breast Cancer  Family Members In General. Colon Cancer  Family Members In General. Heart disease in female  family member before age 60  Prostate Cancer  Family Members In General.  Pregnancy / Birth History Alean Rinne, [place]; 07/02/2017 11:23 AM) Age at menarche  3 years. Age of menopause  10-50 Gravida  71 Maternal age  29-25 Para  2  Other Problems Alean Rinne, [place]; 07/02/2017 11:23 AM) Arthritis  Atrial Fibrillation  Back Pain  High blood pressure  Hypercholesterolemia  Vascular Disease     Review of Systems Alean Rinne RMA; 07/02/2017 11:23 AM) General Not Present- Appetite Loss, Chills, Fatigue, Fever, Night Sweats, Weight Gain and Weight Loss. Skin Present- Dryness and Ulcer. Not Present- Change in Wart/Mole, Hives, Jaundice, New Lesions, Non-Healing Wounds and Rash. HEENT Present- Wears glasses/contact lenses. Not Present- Earache, Hearing Loss, Hoarseness, Nose Bleed, Oral Ulcers, Ringing in the Ears, Seasonal Allergies, Sinus Pain, Sore Throat, Visual Disturbances and Yellow Eyes. Respiratory Present- Snoring. Not Present- Bloody sputum, Chronic Cough, Difficulty Breathing and Wheezing. Breast Not Present- Breast Mass, Breast Pain, Nipple Discharge and Skin Changes. Cardiovascular Present- Rapid Heart Rate and Swelling of Extremities. Not Present- Chest Pain, Difficulty Breathing Lying Down, Leg Cramps, Palpitations and Shortness of Breath. Gastrointestinal Not Present- Abdominal Pain, Bloating, Bloody Stool, Change in Bowel Habits, Chronic diarrhea, Constipation, Difficulty Swallowing, Excessive gas, Gets full quickly at meals, Hemorrhoids, Indigestion, Nausea, Rectal Pain and Vomiting. Female Genitourinary Not Present- Frequency, Nocturia, Painful Urination, Pelvic Pain and Urgency. Musculoskeletal Present- Back Pain, Joint Pain, Muscle Weakness and Swelling of Extremities. Not Present- Joint Stiffness and Muscle Pain. Neurological Present- Trouble walking and Weakness. Not Present- Decreased Memory, Fainting, Headaches, Numbness, Seizures, Tingling and  Tremor. Psychiatric Not Present- Anxiety, Bipolar, Change in Sleep Pattern, Depression, Fearful and Frequent crying. Endocrine Not Present- Cold Intolerance, Excessive Hunger, Hair  Changes, Heat Intolerance, Hot flashes and New Diabetes. Hematology Present- Blood Thinners and Easy Bruising. Not Present- Excessive bleeding, Gland problems, HIV and Persistent Infections.  Vitals Alean Rinne RMA; 07/02/2017 11:24 AM) 07/02/2017 11:23 AM Weight: 167 lb Height: 68in Body Surface Area: 1.89 m Body Mass Index: 25.39 kg/m  Temp.: 98.33F  Pulse: 93 (Regular)  BP: 130/82 (Sitting, Left Arm, Standard)       Physical Exam [name] MD; 07/02/2017 12:21 PM) The physical exam findings are as follows: Note:Constitutional: No acute distress, conversant, appears stated age  Eyes: Anicteric sclerae, moist conjunctiva, no lid lag  Neck: No thyromegaly, trachea midline, no cervical lymphadenopathy  Lungs: Clear to auscultation biilaterally, normal respiratory effot  Cardiovascular: regular rate & rhythm, no murmurs, no peripheal edema, pedal pulses 2+  GI: Soft, no masses or hepatosplenomegaly, non-tender to palpation  MSK: Normal gait, no clubbing cyanosis, edema, lower extremity weakness greater than upper  Skin: No rashes, palpation reveals normal skin turgor  Psychiatric: Appropriate judgment and insight, oriented to person, place, and time    Assessment & Plan [name] MD; 07/02/2017 12:22 PM) MUSCLE WEAKNESS (M62.81) Impression: 83 year old female with muscle weakness  1. Will proceed to the operating for excision of quadriceps muscle biopsy. 2. Discussed with her the risks and benefits of the procedure to include but not limited to: Infection, bleeding, damage to structures. Patient voiced understanding and wishes to proceed.

## 2017-07-20 DIAGNOSIS — M25551 Pain in right hip: Secondary | ICD-10-CM | POA: Diagnosis not present

## 2017-07-20 DIAGNOSIS — M25552 Pain in left hip: Secondary | ICD-10-CM | POA: Diagnosis not present

## 2017-08-06 NOTE — Progress Notes (Signed)
Spoke with Sunday Spillers from Dr. Rameriz's office - Dr. Emogene Morgan wants [place] to have patient sign consent without laterally for now

## 2017-08-06 NOTE — Pre-Procedure Instructions (Signed)
Kelsey [name]  08/06/2017      [facility] - [place], [place] - [address] [place] [address] Phone: [phone] Fax: [phone]    Your procedure is scheduled on January 23  Report to [facility] at [place].  Call this number if you have problems the morning of surgery:  [phone]   Remember:  Do not eat food or drink liquids after midnight.  Continue all medications as directed by your physician except follow these medication instructions before surgery below   Take these medicines the morning of surgery with A SIP OF WATER  amiodarone (PACERONE) HYDROcodone-acetaminophen (NORCO/VICODIN)  metoprolol tartrate (LOPRESSOR)  7 days prior to surgery STOP taking any Aspirin(unless otherwise instructed by your surgeon), Aleve, Naproxen, Ibuprofen, Motrin, Advil, Goody's, BC's, all herbal medications, fish oil, and all vitamins  FOLLOW PHYSICIANS ABOUT XARELTO   Do not wear jewelry, make-up or nail polish.  Do not wear lotions, powders, or perfumes, or deodorant.  Do not shave 48 hours prior to surgery.  Men may shave face and neck.  Do not bring valuables to the hospital.  [facility] is not responsible for any belongings or valuables.  Contacts, dentures or bridgework may not be worn into surgery.  Leave your suitcase in the car.  After surgery it may be brought to your room.  For patients admitted to the hospital, discharge time will be determined by your treatment team.  Patients discharged the day of surgery will not be allowed to drive home.    Special instructions:   [facility]- Preparing For Surgery  Before surgery, you can play an important role. Because skin is not sterile, your skin needs to be as free of germs as possible. You can reduce the number of germs on your skin by washing with CHG (chlorahexidine gluconate) Soap before surgery.  CHG is an antiseptic cleaner which  kills germs and bonds with the skin to continue killing germs even after washing.  Please do not use if you have an allergy to CHG or antibacterial soaps. If your skin becomes reddened/irritated stop using the CHG.  Do not shave (including legs and underarms) for at least 48 hours prior to first CHG shower. It is OK to shave your face.  Please follow these instructions carefully.   1. Shower the NIGHT BEFORE SURGERY and the MORNING OF SURGERY with CHG.   2. If you chose to wash your hair, wash your hair first as usual with your normal shampoo.  3. After you shampoo, rinse your hair and body thoroughly to remove the shampoo.  4. Use CHG as you would any other liquid soap. You can apply CHG directly to the skin and wash gently with a scrungie or a clean washcloth.   5. Apply the CHG Soap to your body ONLY FROM THE NECK DOWN.  Do not use on open wounds or open sores. Avoid contact with your eyes, ears, mouth and genitals (private parts). Wash Face and genitals (private parts)  with your normal soap.  6. Wash thoroughly, paying special attention to the area where your surgery will be performed.  7. Thoroughly rinse your body with warm water from the neck down.  8. DO NOT shower/wash with your normal soap after using and rinsing off the CHG Soap.  9. Pat yourself dry with a CLEAN TOWEL.  10. Wear CLEAN PAJAMAS to bed the night before surgery, wear comfortable clothes the  morning of surgery  11. Place CLEAN SHEETS on your bed the night of your first shower and DO NOT SLEEP WITH PETS.    Day of Surgery: Do not apply any deodorants/lotions. Please wear clean clothes to the hospital/surgery center.      Please read over the following fact sheets that you were given.

## 2017-08-06 NOTE — Progress Notes (Signed)
Called Dr. Emogene Morgan office for clarification on which side the patient is having the muscle BX.  The office will clarify and change order appropriately

## 2017-08-07 ENCOUNTER — Encounter (HOSPITAL_COMMUNITY)
Admission: RE | Admit: 2017-08-07 | Discharge: 2017-08-07 | Disposition: A | Discharge status: Home / Self Care | Payer: Medicare HMO | Source: Ambulatory Visit | Attending: General Surgery | Admitting: General Surgery

## 2017-08-07 ENCOUNTER — Other Ambulatory Visit: Payer: Self-pay

## 2017-08-07 ENCOUNTER — Encounter (HOSPITAL_COMMUNITY): Payer: Self-pay

## 2017-08-07 DIAGNOSIS — Z87891 Personal history of nicotine dependence: Secondary | ICD-10-CM | POA: Diagnosis not present

## 2017-08-07 DIAGNOSIS — Z79899 Other long term (current) drug therapy: Secondary | ICD-10-CM | POA: Insufficient documentation

## 2017-08-07 DIAGNOSIS — H409 Unspecified glaucoma: Secondary | ICD-10-CM | POA: Diagnosis not present

## 2017-08-07 DIAGNOSIS — Z7901 Long term (current) use of anticoagulants: Secondary | ICD-10-CM | POA: Insufficient documentation

## 2017-08-07 DIAGNOSIS — I272 Pulmonary hypertension, unspecified: Secondary | ICD-10-CM | POA: Diagnosis not present

## 2017-08-07 DIAGNOSIS — Z01812 Encounter for preprocedural laboratory examination: Secondary | ICD-10-CM | POA: Insufficient documentation

## 2017-08-07 DIAGNOSIS — I4891 Unspecified atrial fibrillation: Secondary | ICD-10-CM | POA: Insufficient documentation

## 2017-08-07 DIAGNOSIS — I10 Essential (primary) hypertension: Secondary | ICD-10-CM | POA: Insufficient documentation

## 2017-08-07 DIAGNOSIS — E785 Hyperlipidemia, unspecified: Secondary | ICD-10-CM | POA: Diagnosis not present

## 2017-08-07 DIAGNOSIS — Z01818 Encounter for other preprocedural examination: Secondary | ICD-10-CM | POA: Diagnosis present

## 2017-08-07 LAB — BASIC METABOLIC PANEL
Anion gap: 10 (ref 5–15)
BUN: 29 mg/dL — ABNORMAL HIGH (ref 6–20)
CALCIUM: 9.1 mg/dL (ref 8.9–10.3)
CO2: 26 mmol/L (ref 22–32)
CREATININE: 1.26 mg/dL — AB (ref 0.44–1.00)
Chloride: 106 mmol/L (ref 101–111)
GFR calc Af Amer: 44 mL/min — ABNORMAL LOW (ref >60)
GFR, EST NON AFRICAN AMERICAN: 38 mL/min — AB (ref >60)
Glucose, Bld: 101 mg/dL — ABNORMAL HIGH (ref 65–99)
Potassium: 4.6 mmol/L (ref 3.5–5.1)
SODIUM: 142 mmol/L (ref 135–145)

## 2017-08-07 LAB — HEMOGLOBIN: HEMOGLOBIN: 14.3 g/dL (ref 12.0–15.0)

## 2017-08-07 NOTE — Progress Notes (Signed)
PCP - [name] Cardiologist - [place]  Chest x-ray - not needed EKG - requesting  Stress Test - denies ECHO - 2016 Cardiac Cath - denies    Blood Thinner Instructions: Xarelto to stop Sunday 1/20   Anesthesia review: Yes  Patient has a half dollar leg ulcer on the left lower leg.  PCP is aware of that but patient stated that he has not seen in about a month and that she has been completing dressing changes over the last month her self.  Levada Dy NP wants patient to follow up with PCP before surgery for that leg wound.  She also sees Dr Donzetta Matters with Vascular  Patient denies shortness of breath, fever, cough and chest pain at PAT appointment   Patient verbalized understanding of instructions that were given to them at the PAT appointment. Patient was also instructed that they will need to review over the PAT instructions again at home before surgery.

## 2017-08-07 NOTE — Progress Notes (Addendum)
Anesthesia consult:  Pt is an 84 year old female scheduled for quadriceps muscle biopsy on 08/12/2016 with [name], MD  - PCP is [name], MD - Cardiologist is Mahala Menghini, MD who is aware of upcoming surgery and gave ok to hold xarelto. Last office visit 01/26/17  PMH includes:   Atrial fibrillation, HTN, dyslipidemia, pulmonary hypertension, glaucoma.  Former smoker.  BMI 25.  Medications include: Amiodarone, Lasix, losartan, metoprolol, Xarelto.  Patient to hold Xarelto 2 days before surgery  BP (!) 104/58   Pulse 80   Temp 36.8 C (Oral)   Resp 18   Ht 5\' 8"  (1.727 m)   Wt 166 lb 2 oz (75.4 kg)   SpO2 96%   BMI 25.26 kg/m   Preoperative labs reviewed.   - PT/INR to be obtained day of surgery  EKG 01/26/17 requested from Dr. Talbot Grumbling office.   Called to see patient in pre-admission testing for LLE wound.  Pt reports her husband "scratched" her L lower medial leg when helping put on her compression stockings, creating "a hole in my leg".  Per pt's report, wound was large and deep.  This occurred over a month ago.  At this point, wound is approximately 2cm x 3cm and is covered in sloughing tissue.  No erythema or warmth around wound. No signs it is infected.  Pt has been taking care of wound at home with antibiotic ointment and keeping it covered with gauze.  She has not shown wound to PCP or to Dr. Rosendo Gros.   I'm concerned about the length of time for wound healing.  I've asked pt to have her PCP evaluate wound prior to surgery.  I also left a message with Armen, RN in Dr. Johney Frame office about wound and my recommendation she see PCP prior to surgery with the caveat that if he is aware of wound and unconcerned about it, he may override my recommendation.   Willeen Cass, FNP-BC [facility]/Anesthesiology Phone: [phone] 08/07/2017 3:19 PM  Addendum 08/12/17:   I spoke with pt by telephone.  Pt saw PCP Dr. Harrington Challenger 08/11/17 and cleared for surgery.   Will obtained office visit note.   If no changes, I anticipate pt can proceed with surgery as scheduled.   Willeen Cass, FNP-BC [facility] Short Stay Surgical Center/Anesthesiology Phone: [phone] 08/12/2017 3:17 PM

## 2017-08-11 DIAGNOSIS — L97909 Non-pressure chronic ulcer of unspecified part of unspecified lower leg with unspecified severity: Secondary | ICD-10-CM | POA: Diagnosis not present

## 2017-08-12 NOTE — H&P (Signed)
History of Present Illness  The patient is a 84 year old female who presents with a complaint of Muscle weakness. Referred by: Dr. Metta Clines Chief Complaint: Muscle weakness  Patient is a 84 year old female with a less than one-year history of muscle weakness. She states this is worse in the lower extremities in the upper extremity, she states that she has trouble rising out of a seat and standing. Patient has been worked up by Dr. Vertell Limber as well as Dr. Tomi Likens.  Patient sees Dr. Cheryl Flash as her cardiologist. She has a history of A. fib and is on Xarelto.     Past Surgical History  Breast Augmentation  Bilateral. Cataract Surgery  Bilateral. Hip Surgery  Bilateral. Tonsillectomy   Diagnostic Studies History  Colonoscopy  5-10 years ago Mammogram  >3 years ago Pap Smear  >5 years ago  Allergies  No Known Drug Allergies 07/02/2017 (Marked as Inactive) Allergies Reconciled  Cardizem *CALCIUM CHANNEL BLOCKERS*  CloNIDine *ANTIHYPERTENSIVES*  Doxazosin Mesylate *ANTIHYPERTENSIVES*   Medication History  Cozaar (100MG  Tablet, Oral) Active. Lopressor (100MG  Tablet, Oral) Active. Amiodarone HCl (200MG  Tablet, Oral) Active. Digoxin (0.1MG  Capsule, Oral) Active. Fish Oil Omega-3 (1000MG  Capsule, Oral) Active. Lasix (40MG  Tablet, Oral) Active. Xarelto (20MG  Tablet, Oral) Active. Medications Reconciled  Social History  Alcohol use  Moderate alcohol use. Caffeine use  Coffee. No drug use  Tobacco use  Former smoker.  Family History  Alcohol Abuse  Father. Breast Cancer  Family Members In General. Colon Cancer  Family Members In General. Heart disease in female family member before age 60  Prostate Cancer  Family Members In General.  Pregnancy / Birth History  Age at menarche  66 years. Age of menopause  76-50 Gravida  3 Maternal age  55-25 Para  2  Other Problems  Arthritis  Atrial Fibrillation  Back Pain   High blood pressure  Hypercholesterolemia  Vascular Disease     Review of Systems  General Not Present- Appetite Loss, Chills, Fatigue, Fever, Night Sweats, Weight Gain and Weight Loss. Skin Present- Dryness and Ulcer. Not Present- Change in Wart/Mole, Hives, Jaundice, New Lesions, Non-Healing Wounds and Rash. HEENT Present- Wears glasses/contact lenses. Not Present- Earache, Hearing Loss, Hoarseness, Nose Bleed, Oral Ulcers, Ringing in the Ears, Seasonal Allergies, Sinus Pain, Sore Throat, Visual Disturbances and Yellow Eyes. Respiratory Present- Snoring. Not Present- Bloody sputum, Chronic Cough, Difficulty Breathing and Wheezing. Breast Not Present- Breast Mass, Breast Pain, Nipple Discharge and Skin Changes. Cardiovascular Present- Rapid Heart Rate and Swelling of Extremities. Not Present- Chest Pain, Difficulty Breathing Lying Down, Leg Cramps, Palpitations and Shortness of Breath. Gastrointestinal Not Present- Abdominal Pain, Bloating, Bloody Stool, Change in Bowel Habits, Chronic diarrhea, Constipation, Difficulty Swallowing, Excessive gas, Gets full quickly at meals, Hemorrhoids, Indigestion, Nausea, Rectal Pain and Vomiting. Female Genitourinary Not Present- Frequency, Nocturia, Painful Urination, Pelvic Pain and Urgency. Musculoskeletal Present- Back Pain, Joint Pain, Muscle Weakness and Swelling of Extremities. Not Present- Joint Stiffness and Muscle Pain. Neurological Present- Trouble walking and Weakness. Not Present- Decreased Memory, Fainting, Headaches, Numbness, Seizures, Tingling and Tremor. Psychiatric Not Present- Anxiety, Bipolar, Change in Sleep Pattern, Depression, Fearful and Frequent crying. Endocrine Not Present- Cold Intolerance, Excessive Hunger, Hair Changes, Heat Intolerance, Hot flashes and New Diabetes. Hematology Present- Blood Thinners and Easy Bruising. Not Present- Excessive bleeding, Gland problems, HIV and Persistent Infections.     Physical  Exam  The physical exam findings are as follows: Note:Constitutional: No acute distress, conversant, appears stated age  Eyes: Anicteric  sclerae, moist conjunctiva, no lid lag  Neck: No thyromegaly, trachea midline, no cervical lymphadenopathy  Lungs: Clear to auscultation biilaterally, normal respiratory effot  Cardiovascular: regular rate & rhythm, no murmurs, no peripheal edema, pedal pulses 2+  GI: Soft, no masses or hepatosplenomegaly, non-tender to palpation  MSK: Normal gait, no clubbing cyanosis, edema, lower extremity weakness greater than upper  Skin: No rashes, palpation reveals normal skin turgor  Psychiatric: Appropriate judgment and insight, oriented to person, place, and time    Assessment & Plan  MUSCLE WEAKNESS (M62.81) Impression: 84 year old female with muscle weakness  1. Will proceed to the operating for excision of quadriceps muscle biopsy. 2. Discussed with her the risks and benefits of the procedure to include but not limited to: Infection, bleeding, damage to structures. Patient voiced understanding and wishes to proceed.

## 2017-08-13 ENCOUNTER — Encounter (HOSPITAL_COMMUNITY): Payer: Self-pay

## 2017-08-13 ENCOUNTER — Ambulatory Visit (HOSPITAL_COMMUNITY): Payer: Medicare HMO | Admitting: Emergency Medicine

## 2017-08-13 ENCOUNTER — Ambulatory Visit (HOSPITAL_COMMUNITY): Payer: Medicare HMO | Admitting: Anesthesiology

## 2017-08-13 ENCOUNTER — Ambulatory Visit (HOSPITAL_COMMUNITY)
Admission: RE | Admit: 2017-08-13 | Discharge: 2017-08-13 | Disposition: A | Discharge status: Home / Self Care | Payer: Medicare HMO | Source: Ambulatory Visit | Attending: General Surgery | Admitting: General Surgery

## 2017-08-13 ENCOUNTER — Encounter (HOSPITAL_COMMUNITY)
Admission: RE | Disposition: A | Discharge status: Home / Self Care | Payer: Self-pay | Source: Ambulatory Visit | Attending: General Surgery

## 2017-08-13 DIAGNOSIS — I482 Chronic atrial fibrillation: Secondary | ICD-10-CM | POA: Diagnosis not present

## 2017-08-13 DIAGNOSIS — Z79899 Other long term (current) drug therapy: Secondary | ICD-10-CM | POA: Diagnosis not present

## 2017-08-13 DIAGNOSIS — I4891 Unspecified atrial fibrillation: Secondary | ICD-10-CM | POA: Diagnosis not present

## 2017-08-13 DIAGNOSIS — Z7902 Long term (current) use of antithrombotics/antiplatelets: Secondary | ICD-10-CM | POA: Insufficient documentation

## 2017-08-13 DIAGNOSIS — I10 Essential (primary) hypertension: Secondary | ICD-10-CM | POA: Diagnosis not present

## 2017-08-13 DIAGNOSIS — E7800 Pure hypercholesterolemia, unspecified: Secondary | ICD-10-CM | POA: Insufficient documentation

## 2017-08-13 DIAGNOSIS — Z87891 Personal history of nicotine dependence: Secondary | ICD-10-CM | POA: Diagnosis not present

## 2017-08-13 DIAGNOSIS — M6281 Muscle weakness (generalized): Secondary | ICD-10-CM | POA: Diagnosis not present

## 2017-08-13 DIAGNOSIS — I471 Supraventricular tachycardia: Secondary | ICD-10-CM | POA: Diagnosis not present

## 2017-08-13 HISTORY — PX: MUSCLE BIOPSY: SHX716

## 2017-08-13 LAB — PROTIME-INR
INR: 1.04
Prothrombin Time: 13.6 seconds (ref 11.4–15.2)

## 2017-08-13 SURGERY — MUSCLE BIOPSY
Anesthesia: General | Site: Leg Upper

## 2017-08-13 MED ORDER — FENTANYL CITRATE (PF) 100 MCG/2ML IJ SOLN: 25.0000 ug | INTRAMUSCULAR | Status: DC | PRN

## 2017-08-13 MED ORDER — PHENYLEPHRINE 40 MCG/ML (10ML) SYRINGE FOR IV PUSH (FOR BLOOD PRESSURE SUPPORT)
PREFILLED_SYRINGE | INTRAVENOUS | Status: AC
  Filled 2017-08-13: qty 10

## 2017-08-13 MED ORDER — OXYCODONE HCL 5 MG PO TABS: 5.0000 mg | ORAL_TABLET | ORAL | Status: DC | PRN

## 2017-08-13 MED ORDER — LIDOCAINE 2% (20 MG/ML) 5 ML SYRINGE
INTRAMUSCULAR | Status: DC | PRN
  Administered 2017-08-13: 60 mg via INTRAVENOUS

## 2017-08-13 MED ORDER — LACTATED RINGERS IV SOLN
INTRAVENOUS | Status: DC
  Administered 2017-08-13: 08:00:00 via INTRAVENOUS

## 2017-08-13 MED ORDER — ONDANSETRON HCL 4 MG/2ML IJ SOLN: 4.0000 mg | Freq: Once | INTRAMUSCULAR | Status: DC | PRN

## 2017-08-13 MED ORDER — SODIUM CHLORIDE 0.9 % IV SOLN: 250.0000 mL | INTRAVENOUS | Status: DC | PRN

## 2017-08-13 MED ORDER — SODIUM CHLORIDE 0.9% FLUSH: 3.0000 mL | INTRAVENOUS | Status: DC | PRN

## 2017-08-13 MED ORDER — CHLORHEXIDINE GLUCONATE CLOTH 2 % EX PADS: 6.0000 | MEDICATED_PAD | Freq: Once | CUTANEOUS | Status: DC

## 2017-08-13 MED ORDER — DEXAMETHASONE SODIUM PHOSPHATE 10 MG/ML IJ SOLN
INTRAMUSCULAR | Status: DC | PRN
  Administered 2017-08-13: 5 mg via INTRAVENOUS

## 2017-08-13 MED ORDER — ACETAMINOPHEN 325 MG PO TABS: 650.0000 mg | ORAL_TABLET | ORAL | Status: DC | PRN

## 2017-08-13 MED ORDER — LIDOCAINE HCL (PF) 1 % IJ SOLN
INTRAMUSCULAR | Status: AC
  Filled 2017-08-13: qty 30

## 2017-08-13 MED ORDER — FENTANYL CITRATE (PF) 100 MCG/2ML IJ SOLN
INTRAMUSCULAR | Status: DC | PRN
  Administered 2017-08-13: 50 ug via INTRAVENOUS

## 2017-08-13 MED ORDER — TRAMADOL HCL 50 MG PO TABS: 50.0000 mg | ORAL_TABLET | Freq: Four times a day (QID) | ORAL | 0 refills | Status: AC | PRN

## 2017-08-13 MED ORDER — DEXAMETHASONE SODIUM PHOSPHATE 10 MG/ML IJ SOLN
INTRAMUSCULAR | Status: AC
  Filled 2017-08-13: qty 1

## 2017-08-13 MED ORDER — ONDANSETRON HCL 4 MG/2ML IJ SOLN
INTRAMUSCULAR | Status: AC
  Filled 2017-08-13: qty 2

## 2017-08-13 MED ORDER — PROPOFOL 10 MG/ML IV BOLUS
INTRAVENOUS | Status: AC
  Filled 2017-08-13: qty 20

## 2017-08-13 MED ORDER — FENTANYL CITRATE (PF) 250 MCG/5ML IJ SOLN
INTRAMUSCULAR | Status: AC
  Filled 2017-08-13: qty 5

## 2017-08-13 MED ORDER — MORPHINE SULFATE (PF) 2 MG/ML IV SOLN: 2.0000 mg | INTRAVENOUS | Status: DC | PRN

## 2017-08-13 MED ORDER — CEFAZOLIN SODIUM-DEXTROSE 2-4 GM/100ML-% IV SOLN
INTRAVENOUS | Status: AC
  Filled 2017-08-13: qty 100

## 2017-08-13 MED ORDER — ONDANSETRON HCL 4 MG/2ML IJ SOLN
INTRAMUSCULAR | Status: DC | PRN
  Administered 2017-08-13: 4 mg via INTRAVENOUS

## 2017-08-13 MED ORDER — BUPIVACAINE HCL (PF) 0.25 % IJ SOLN
INTRAMUSCULAR | Status: AC
  Filled 2017-08-13: qty 30

## 2017-08-13 MED ORDER — 0.9 % SODIUM CHLORIDE (POUR BTL) OPTIME
TOPICAL | Status: DC | PRN
  Administered 2017-08-13: 1000 mL

## 2017-08-13 MED ORDER — SODIUM CHLORIDE 0.9% FLUSH: 3.0000 mL | Freq: Two times a day (BID) | INTRAVENOUS | Status: DC

## 2017-08-13 MED ORDER — ACETAMINOPHEN 650 MG RE SUPP: 650.0000 mg | RECTAL | Status: DC | PRN

## 2017-08-13 MED ORDER — PHENYLEPHRINE 40 MCG/ML (10ML) SYRINGE FOR IV PUSH (FOR BLOOD PRESSURE SUPPORT)
PREFILLED_SYRINGE | INTRAVENOUS | Status: DC | PRN
  Administered 2017-08-13: 40 ug via INTRAVENOUS
  Administered 2017-08-13: 80 ug via INTRAVENOUS

## 2017-08-13 MED ORDER — LIDOCAINE 2% (20 MG/ML) 5 ML SYRINGE
INTRAMUSCULAR | Status: AC
  Filled 2017-08-13: qty 5

## 2017-08-13 MED ORDER — PROPOFOL 10 MG/ML IV BOLUS
INTRAVENOUS | Status: DC | PRN
  Administered 2017-08-13: 150 mg via INTRAVENOUS
  Administered 2017-08-13: 50 mg via INTRAVENOUS

## 2017-08-13 MED ORDER — CEFAZOLIN SODIUM-DEXTROSE 2-4 GM/100ML-% IV SOLN
2.0000 g | INTRAVENOUS | Status: AC
  Administered 2017-08-13: 2 g via INTRAVENOUS

## 2017-08-13 SURGICAL SUPPLY — 40 items
BLADE SURG 15 STRL LF DISP TIS (BLADE) ×1 IMPLANT
BLADE SURG 15 STRL SS (BLADE) ×1
CHLORAPREP W/TINT 10.5 ML (MISCELLANEOUS) ×2 IMPLANT
CONT SPEC 4OZ CLIKSEAL STRL BL (MISCELLANEOUS) ×2 IMPLANT
COVER SURGICAL LIGHT HANDLE (MISCELLANEOUS) ×2 IMPLANT
DECANTER SPIKE VIAL GLASS SM (MISCELLANEOUS) ×2 IMPLANT
DERMABOND ADVANCED (GAUZE/BANDAGES/DRESSINGS) ×1
DERMABOND ADVANCED .7 DNX12 (GAUZE/BANDAGES/DRESSINGS) ×1 IMPLANT
DRAPE LAPAROTOMY 100X72 PEDS (DRAPES) ×2 IMPLANT
DRAPE UTILITY XL STRL (DRAPES) ×2 IMPLANT
DRSG TELFA 3X8 NADH (GAUZE/BANDAGES/DRESSINGS) ×2 IMPLANT
ELECT CAUTERY BLADE 6.4 (BLADE) ×2 IMPLANT
ELECT REM PT RETURN 9FT ADLT (ELECTROSURGICAL) ×2
ELECTRODE REM PT RTRN 9FT ADLT (ELECTROSURGICAL) ×1 IMPLANT
GAUZE SPONGE 4X4 16PLY XRAY LF (GAUZE/BANDAGES/DRESSINGS) ×2 IMPLANT
GLOVE BIO SURGEON STRL SZ7.5 (GLOVE) ×2 IMPLANT
GLOVE BIOGEL PI IND STRL 8 (GLOVE) ×1 IMPLANT
GLOVE BIOGEL PI INDICATOR 8 (GLOVE) ×1
GOWN STRL REUS W/ TWL LRG LVL3 (GOWN DISPOSABLE) ×1 IMPLANT
GOWN STRL REUS W/ TWL XL LVL3 (GOWN DISPOSABLE) ×1 IMPLANT
GOWN STRL REUS W/TWL LRG LVL3 (GOWN DISPOSABLE) ×1
GOWN STRL REUS W/TWL XL LVL3 (GOWN DISPOSABLE) ×1
KIT BASIN OR (CUSTOM PROCEDURE TRAY) ×2 IMPLANT
KIT ROOM TURNOVER OR (KITS) ×2 IMPLANT
NEEDLE HYPO 25GX1X1/2 BEV (NEEDLE) ×2 IMPLANT
NS IRRIG 1000ML POUR BTL (IV SOLUTION) ×2 IMPLANT
PACK SURGICAL SETUP 50X90 (CUSTOM PROCEDURE TRAY) ×2 IMPLANT
PAD ARMBOARD 7.5X6 YLW CONV (MISCELLANEOUS) ×2 IMPLANT
PENCIL BUTTON HOLSTER BLD 10FT (ELECTRODE) ×2 IMPLANT
SUT MNCRL AB 4-0 PS2 18 (SUTURE) ×2 IMPLANT
SUT SILK 2 0 (SUTURE) ×1
SUT SILK 2-0 18XBRD TIE 12 (SUTURE) ×1 IMPLANT
SUT SILK 3 0 (SUTURE) ×1
SUT SILK 3-0 18XBRD TIE 12 (SUTURE) ×1 IMPLANT
SUT VIC AB 3-0 SH 27 (SUTURE) ×2
SUT VIC AB 3-0 SH 27X BRD (SUTURE) ×2 IMPLANT
SYR BULB 3OZ (MISCELLANEOUS) ×2 IMPLANT
SYR CONTROL 10ML LL (SYRINGE) ×2 IMPLANT
TOWEL OR 17X24 6PK STRL BLUE (TOWEL DISPOSABLE) ×2 IMPLANT
TOWEL OR 17X26 10 PK STRL BLUE (TOWEL DISPOSABLE) ×2 IMPLANT

## 2017-08-13 NOTE — Anesthesia Postprocedure Evaluation (Signed)
Anesthesia Post Note  Patient: Kelsey [name]  Procedure(s) Performed: LEFT QUADRICEPS MUSCLE BIOPSY (N/A Leg Upper)     Patient location during evaluation: PACU Anesthesia Type: General Level of consciousness: awake and alert Pain management: pain level controlled Vital Signs Assessment: post-procedure vital signs reviewed and stable Respiratory status: spontaneous breathing, nonlabored ventilation, respiratory function stable and patient connected to nasal cannula oxygen Cardiovascular status: blood pressure returned to baseline and stable Postop Assessment: no apparent nausea or vomiting Anesthetic complications: no    Last Vitals:  Vitals:   08/13/17 1044 08/13/17 1050  BP: 130/68 (!) 144/88  Pulse:  73  Resp: 14 15  Temp: 36.7 C   SpO2: 93% 94%    Last Pain:  Vitals:   08/13/17 1044  TempSrc:   PainSc: 0-No pain                 Ryan P Ellender

## 2017-08-13 NOTE — Anesthesia Procedure Notes (Signed)
Procedure Name: LMA Insertion Date/Time: 08/13/2017 9:47 AM Performed by: Kyung Rudd, CRNA Pre-anesthesia Checklist: Patient identified, Emergency Drugs available, Suction available and Patient being monitored Patient Re-evaluated:Patient Re-evaluated prior to induction Oxygen Delivery Method: Circle system utilized Preoxygenation: Pre-oxygenation with 100% oxygen Induction Type: IV induction LMA: LMA inserted LMA Size: 4.0 Number of attempts: 1 Placement Confirmation: positive ETCO2 and breath sounds checked- equal and bilateral Tube secured with: Tape Dental Injury: Teeth and Oropharynx as per pre-operative assessment

## 2017-08-13 NOTE — Discharge Instructions (Signed)
Muscle Biopsy, Care After °Refer to this sheet in the next few weeks. These instructions provide you with information about caring for yourself after your procedure. Your health care provider may also give you more specific instructions. Your treatment has been planned according to current medical practices, but problems sometimes occur. Call your health care provider if you have any problems or questions after your procedure. °What can I expect after the procedure? °After your procedure, it is common to have soreness and tenderness at the site of the biopsy. This may last for a few days. °Follow these instructions at home: °Biopsy Site Care ° °· Follow instructions from your health care provider about how to take care of your biopsy site. Make sure you: °? Change any bandages (dressings) as told by your health care provider. °? Wash your hands with soap and water before you change your bandage (dressing). If soap and water are not available, use hand sanitizer. °? Leave any stitches (sutures), skin glue, or adhesive strips in place. These skin closures may need to stay in place for 2 weeks or longer. If adhesive strip edges start to loosen and curl up, you may trim the loose edges. Do not remove adhesive strips completely unless your health care provider tells you to do that. °· Check your biopsy site every day for signs of infection. Check for: °? More redness, swelling, or pain. °? More fluid or blood. °? Warmth. °? Pus or a bad smell. °Activity °· Limit activities or movements as told by your health care provider. °· Do not drive for 24 hours if you received a medicine to help you relax (sedative). °General instructions °· Take over-the-counter and prescription medicines only as told by your health care provider. °· Do not take baths, swim, or use a hot tub until your health care provider approves. Ask your health care provider if you can take showers. °· Keep all follow-up visits as told by your health care  provider. This is important. °Contact a health care provider if: °· You have more redness, swelling, or pain around your biopsy site. °· You have more fluid or blood coming from your biopsy site. °· Your biopsy site feels warm to the touch. °· You have pus or a bad smell coming from your biopsy site. °· You have a fever. °· You are light-headed or you feel faint. °Get help right away if: °· You develop a rash. °· You have difficulty breathing. °· You have numbness or tingling going down the arm or leg of your biopsy site. °This information is not intended to replace advice given to you by your health care provider. Make sure you discuss any questions you have with your health care provider. °Document Released: 01/24/2005 Document Revised: 03/06/2016 Document Reviewed: 04/10/2015 °Elsevier Interactive Patient Education © 2018 [employer] ° °

## 2017-08-13 NOTE — Op Note (Signed)
08/13/2017  10:14 AM  PATIENT:  Kelsey [name]  84 y.o. female  PRE-OPERATIVE DIAGNOSIS:  MUSCLE WEAKNESS  POST-OPERATIVE DIAGNOSIS:  MUSCLE WEAKNESS  PROCEDURE:  Procedure(s): LEFT QUADRICEPS MUSCLE BIOPSY (N/A)  SURGEON:  Surgeon(s) and Role:    * [name], MD - Primary  ANESTHESIA:   local and general  EBL:  minimal   BLOOD ADMINISTERED:none  DRAINS: none   LOCAL MEDICATIONS USED:  BUPIVICAINE   SPECIMEN:  Source of Specimen:  L quadriceps muscle biopsy  DISPOSITION OF SPECIMEN:  PATHOLOGY  COUNTS:  YES  TOURNIQUET:  * No tourniquets in log *  DICTATION: .Dragon Dictation  The patient was taken to the OR and placed in the supine position with bilateral SCDs in place. The patient was prepped and draped in the usual sterile fashion. A time out was called and all facts were verified.   A 3cm incision was made over his L quadriceps muscle. Sharp dissection was taken down to his fascia. This was incised. A measrued muscle of 2.5x 2cm was Isolated with hemostats. This was sharply excised. Each cut end was tied off with 2-0 silk ties. The specimen was sent to pathology.   I checked for hemostasis and it was excellent. 3-0 vicryl were used to reapproximate the deep dermal layer. 4-0 monocryls were used to reapproximate the skin in a subcuticular fashion. Dermabond was used as a dressing.   The patient tolerated the procedure well. He was taken to the recovery room in stable condition.    PLAN OF CARE: Discharge to home after PACU  PATIENT DISPOSITION:  PACU - hemodynamically stable.   Delay start of Pharmacological VTE agent (>24hrs) due to surgical blood loss or risk of bleeding: not applicable

## 2017-08-13 NOTE — Interval H&P Note (Signed)
History and Physical Interval Note:  08/13/2017 8:45 AM  Kelsey [name]  has presented today for surgery, with the diagnosis of MUSCLE WEAKNESS  The various methods of treatment have been discussed with the patient and family. After consideration of risks, benefits and other options for treatment, the patient has consented to  Procedure(s): QUADRICEPS MUSCLE BIOPSY (N/A) as a surgical intervention .  The patient's history has been reviewed, patient examined, no change in status, stable for surgery.  I have reviewed the patient's chart and labs.  Questions were answered to the patient's satisfaction.     Rosario Jacks., Anne Hahn

## 2017-08-13 NOTE — Anesthesia Preprocedure Evaluation (Addendum)
Anesthesia Evaluation  Patient identified by MRN, date of birth, ID band Patient awake    Reviewed: Allergy & Precautions, NPO status , Patient's Chart, lab work & pertinent test results  History of Anesthesia Complications Negative for: history of anesthetic complications  Airway Mallampati: II  TM Distance: >3 FB Neck ROM: Full    Dental  (+) Teeth Intact, Dental Advisory Given   Pulmonary former smoker,    Pulmonary exam normal        Cardiovascular hypertension, Pt. on home beta blockers and Pt. on medications + dysrhythmias Atrial Fibrillation  Rhythm:Irregular Rate:Normal  ECG: A-Fib, rate 79  Pulmonary HTN   Cardiologist is Mahala Menghini, MD who is aware of upcoming surgery and gave ok to hold xarelto. Last office visit 01/26/17      Neuro/Psych negative neurological ROS  negative psych ROS   GI/Hepatic negative GI ROS, Neg liver ROS,   Endo/Other  negative endocrine ROS  Renal/GU negative Renal ROS     Musculoskeletal negative musculoskeletal ROS (+)   Abdominal   Peds  Hematology HLD   Anesthesia Other Findings MUSCLE WEAKNESS  Reproductive/Obstetrics                           Anesthesia Physical  Anesthesia Plan  ASA: III  Anesthesia Plan: General   Post-op Pain Management:    Induction: Intravenous  PONV Risk Score and Plan: 3 and Ondansetron, Dexamethasone and Treatment may vary due to age or medical condition  Airway Management Planned: LMA  Additional Equipment:   Intra-op Plan:   Post-operative Plan: Extubation in OR  Informed Consent: I have reviewed the patients History and Physical, chart, labs and discussed the procedure including the risks, benefits and alternatives for the proposed anesthesia with the patient or authorized representative who has indicated his/her understanding and acceptance.   Dental advisory given  Plan Discussed with:  CRNA  Anesthesia Plan Comments:         Anesthesia Quick Evaluation

## 2017-08-13 NOTE — Transfer of Care (Signed)
Immediate Anesthesia Transfer of Care Note  Patient: Kelsey [name]  Procedure(s) Performed: LEFT QUADRICEPS MUSCLE BIOPSY (N/A Leg Upper)  Patient Location: PACU  Anesthesia Type:General  Level of Consciousness: awake, alert  and oriented  Airway & Oxygen Therapy: Patient Spontanous Breathing and Patient connected to nasal cannula oxygen  Post-op Assessment: Report given to RN, Post -op Vital signs reviewed and stable and Patient moving all extremities X 4  Post vital signs: Reviewed and stable  Last Vitals:  Vitals:   08/13/17 0735 08/13/17 0737  BP:  (!) 159/99  Pulse: 90   Resp: 20   Temp: 36.7 C   SpO2: 97%     Last Pain:  Vitals:   08/13/17 0737  TempSrc:   PainSc: 8          Complications: No apparent anesthesia complications

## 2017-08-14 ENCOUNTER — Encounter (HOSPITAL_COMMUNITY): Payer: Self-pay | Admitting: General Surgery

## 2017-08-26 ENCOUNTER — Encounter (HOSPITAL_COMMUNITY): Payer: Self-pay

## 2017-08-31 DIAGNOSIS — Z961 Presence of intraocular lens: Secondary | ICD-10-CM | POA: Diagnosis not present

## 2017-08-31 DIAGNOSIS — H40013 Open angle with borderline findings, low risk, bilateral: Secondary | ICD-10-CM | POA: Diagnosis not present

## 2017-09-02 ENCOUNTER — Telehealth: Payer: Self-pay

## 2017-09-02 DIAGNOSIS — R29898 Other symptoms and signs involving the musculoskeletal system: Secondary | ICD-10-CM

## 2017-09-02 DIAGNOSIS — R2681 Unsteadiness on feet: Secondary | ICD-10-CM

## 2017-09-02 NOTE — Telephone Encounter (Signed)
-----   Message from Pieter Partridge, DO sent at 08/31/2017 10:46 AM EST ----- Muscle biopsy is negative.  There is no evidence of muscle disease.  In order to complete workup of unsteady gait and weakness in the extremities, I would like to check MRI of cervical spine without contrast.

## 2017-09-08 DIAGNOSIS — Z79899 Other long term (current) drug therapy: Secondary | ICD-10-CM | POA: Diagnosis not present

## 2017-09-08 DIAGNOSIS — E785 Hyperlipidemia, unspecified: Secondary | ICD-10-CM | POA: Diagnosis not present

## 2017-09-08 DIAGNOSIS — R5382 Chronic fatigue, unspecified: Secondary | ICD-10-CM | POA: Diagnosis not present

## 2017-09-08 DIAGNOSIS — R0602 Shortness of breath: Secondary | ICD-10-CM | POA: Diagnosis not present

## 2017-09-08 DIAGNOSIS — R600 Localized edema: Secondary | ICD-10-CM | POA: Diagnosis not present

## 2017-09-08 DIAGNOSIS — I10 Essential (primary) hypertension: Secondary | ICD-10-CM | POA: Diagnosis not present

## 2017-09-08 DIAGNOSIS — I481 Persistent atrial fibrillation: Secondary | ICD-10-CM | POA: Diagnosis not present

## 2017-09-10 ENCOUNTER — Telehealth: Payer: Self-pay | Admitting: Neurology

## 2017-09-10 NOTE — Telephone Encounter (Signed)
Patient Lmom to have you call her. Thanks

## 2017-09-10 NOTE — Telephone Encounter (Signed)
Called and spoke with Pt, she has not heard from anyone concerning scheduling of the MRI C spine, offered her the number to call [facility] to schedule, she requested I call them. Called [facility], spoke with Prentiss Bells, she will call Pt to schedule.

## 2017-09-21 ENCOUNTER — Ambulatory Visit
Admission: RE | Admit: 2017-09-21 | Discharge: 2017-09-21 | Disposition: A | Discharge status: Home / Self Care | Payer: Medicare HMO | Source: Ambulatory Visit | Attending: Neurology | Admitting: Neurology

## 2017-09-21 DIAGNOSIS — R2681 Unsteadiness on feet: Secondary | ICD-10-CM

## 2017-09-21 DIAGNOSIS — R29898 Other symptoms and signs involving the musculoskeletal system: Secondary | ICD-10-CM

## 2017-09-21 DIAGNOSIS — M4802 Spinal stenosis, cervical region: Secondary | ICD-10-CM | POA: Diagnosis not present

## 2017-09-23 ENCOUNTER — Telehealth: Payer: Self-pay

## 2017-09-23 NOTE — Telephone Encounter (Signed)
Spoke with patient and reiterated the results of her MRI.  Patient understood and will follow up with Dr. Vertell Limber as Dr. Tomi Likens directed

## 2017-09-24 ENCOUNTER — Telehealth: Payer: Self-pay | Admitting: Neurology

## 2017-09-24 NOTE — Telephone Encounter (Signed)
Fax # [phone] MRI

## 2017-10-05 DIAGNOSIS — M6281 Muscle weakness (generalized): Secondary | ICD-10-CM | POA: Diagnosis not present

## 2017-10-05 DIAGNOSIS — Z79899 Other long term (current) drug therapy: Secondary | ICD-10-CM | POA: Diagnosis not present

## 2017-10-05 DIAGNOSIS — M549 Dorsalgia, unspecified: Secondary | ICD-10-CM | POA: Diagnosis not present

## 2017-10-21 DIAGNOSIS — M4126 Other idiopathic scoliosis, lumbar region: Secondary | ICD-10-CM | POA: Diagnosis not present

## 2017-10-21 DIAGNOSIS — M545 Low back pain: Secondary | ICD-10-CM | POA: Diagnosis not present

## 2017-10-21 DIAGNOSIS — M4802 Spinal stenosis, cervical region: Secondary | ICD-10-CM | POA: Diagnosis not present

## 2017-10-21 DIAGNOSIS — M4804 Spinal stenosis, thoracic region: Secondary | ICD-10-CM | POA: Diagnosis not present

## 2017-10-21 DIAGNOSIS — G7100 Muscular dystrophy, unspecified: Secondary | ICD-10-CM | POA: Diagnosis not present

## 2017-10-27 DIAGNOSIS — L97911 Non-pressure chronic ulcer of unspecified part of right lower leg limited to breakdown of skin: Secondary | ICD-10-CM | POA: Diagnosis not present

## 2017-10-29 ENCOUNTER — Other Ambulatory Visit: Payer: Self-pay | Admitting: Neurosurgery

## 2017-10-29 DIAGNOSIS — M4126 Other idiopathic scoliosis, lumbar region: Secondary | ICD-10-CM

## 2017-10-29 DIAGNOSIS — M4804 Spinal stenosis, thoracic region: Secondary | ICD-10-CM

## 2017-11-05 ENCOUNTER — Ambulatory Visit
Admission: RE | Admit: 2017-11-05 | Discharge: 2017-11-05 | Disposition: A | Discharge status: Home / Self Care | Payer: Medicare HMO | Source: Ambulatory Visit | Attending: Neurosurgery | Admitting: Neurosurgery

## 2017-11-05 DIAGNOSIS — M48061 Spinal stenosis, lumbar region without neurogenic claudication: Secondary | ICD-10-CM | POA: Diagnosis not present

## 2017-11-05 DIAGNOSIS — M4126 Other idiopathic scoliosis, lumbar region: Secondary | ICD-10-CM

## 2017-11-05 DIAGNOSIS — M5124 Other intervertebral disc displacement, thoracic region: Secondary | ICD-10-CM | POA: Diagnosis not present

## 2017-11-05 DIAGNOSIS — M4807 Spinal stenosis, lumbosacral region: Secondary | ICD-10-CM | POA: Diagnosis not present

## 2017-11-05 DIAGNOSIS — M5126 Other intervertebral disc displacement, lumbar region: Secondary | ICD-10-CM | POA: Diagnosis not present

## 2017-11-05 DIAGNOSIS — M4804 Spinal stenosis, thoracic region: Secondary | ICD-10-CM | POA: Diagnosis not present

## 2017-11-17 DIAGNOSIS — R531 Weakness: Secondary | ICD-10-CM | POA: Diagnosis not present

## 2017-11-17 DIAGNOSIS — I739 Peripheral vascular disease, unspecified: Secondary | ICD-10-CM | POA: Diagnosis not present

## 2017-11-17 DIAGNOSIS — M545 Low back pain: Secondary | ICD-10-CM | POA: Diagnosis not present

## 2017-11-17 DIAGNOSIS — M4126 Other idiopathic scoliosis, lumbar region: Secondary | ICD-10-CM | POA: Diagnosis not present

## 2017-11-17 DIAGNOSIS — I10 Essential (primary) hypertension: Secondary | ICD-10-CM | POA: Diagnosis not present

## 2017-11-17 DIAGNOSIS — G7100 Muscular dystrophy, unspecified: Secondary | ICD-10-CM | POA: Diagnosis not present

## 2017-11-17 DIAGNOSIS — M4120 Other idiopathic scoliosis, site unspecified: Secondary | ICD-10-CM | POA: Diagnosis not present

## 2017-11-17 DIAGNOSIS — Z6825 Body mass index (BMI) 25.0-25.9, adult: Secondary | ICD-10-CM | POA: Diagnosis not present

## 2017-11-27 DIAGNOSIS — M25511 Pain in right shoulder: Secondary | ICD-10-CM | POA: Diagnosis not present

## 2017-11-27 DIAGNOSIS — M25811 Other specified joint disorders, right shoulder: Secondary | ICD-10-CM | POA: Diagnosis not present

## 2017-12-10 DIAGNOSIS — R69 Illness, unspecified: Secondary | ICD-10-CM | POA: Diagnosis not present

## 2017-12-17 DIAGNOSIS — I6523 Occlusion and stenosis of bilateral carotid arteries: Secondary | ICD-10-CM | POA: Diagnosis not present

## 2018-01-11 DIAGNOSIS — M545 Low back pain: Secondary | ICD-10-CM | POA: Diagnosis not present

## 2018-01-11 DIAGNOSIS — R609 Edema, unspecified: Secondary | ICD-10-CM | POA: Diagnosis not present

## 2018-01-11 DIAGNOSIS — I4891 Unspecified atrial fibrillation: Secondary | ICD-10-CM | POA: Diagnosis not present

## 2018-01-27 ENCOUNTER — Telehealth: Payer: Self-pay | Admitting: *Deleted

## 2018-01-27 NOTE — Telephone Encounter (Signed)
-----   Message from Lujean Amel sent at 01/27/2018 12:13 PM EDT ----- Regarding: Triage Phone call Contact: [phone] Hi Ladies! Can one of you please give this patient a call back? She saw Dr.Cain in Oct but is now experiencing extreme bil LE swelling with no relief from elevating her legs. She cannot wear her compression stockings because of this. These symptoms began about a week ago. Thanks, Anne Ng

## 2018-01-27 NOTE — Telephone Encounter (Signed)
Attempted to call patient but LMTCB, patient needs to make appt with PCP, was seen by Dr. Donzetta Matters but he had nothing to offer.

## 2018-02-02 DIAGNOSIS — R609 Edema, unspecified: Secondary | ICD-10-CM | POA: Diagnosis not present

## 2018-02-02 DIAGNOSIS — M549 Dorsalgia, unspecified: Secondary | ICD-10-CM | POA: Diagnosis not present

## 2018-02-12 DIAGNOSIS — I4891 Unspecified atrial fibrillation: Secondary | ICD-10-CM | POA: Diagnosis not present

## 2018-02-12 DIAGNOSIS — Z0000 Encounter for general adult medical examination without abnormal findings: Secondary | ICD-10-CM | POA: Diagnosis not present

## 2018-02-12 DIAGNOSIS — E782 Mixed hyperlipidemia: Secondary | ICD-10-CM | POA: Diagnosis not present

## 2018-02-12 DIAGNOSIS — J449 Chronic obstructive pulmonary disease, unspecified: Secondary | ICD-10-CM | POA: Diagnosis not present

## 2018-02-12 DIAGNOSIS — Z1389 Encounter for screening for other disorder: Secondary | ICD-10-CM | POA: Diagnosis not present

## 2018-02-12 DIAGNOSIS — I10 Essential (primary) hypertension: Secondary | ICD-10-CM | POA: Diagnosis not present

## 2018-02-12 DIAGNOSIS — M4800 Spinal stenosis, site unspecified: Secondary | ICD-10-CM | POA: Diagnosis not present

## 2018-02-12 DIAGNOSIS — M353 Polymyalgia rheumatica: Secondary | ICD-10-CM | POA: Diagnosis not present

## 2018-02-12 DIAGNOSIS — R630 Anorexia: Secondary | ICD-10-CM | POA: Diagnosis not present

## 2018-02-24 DIAGNOSIS — R002 Palpitations: Secondary | ICD-10-CM | POA: Diagnosis not present

## 2018-02-25 DIAGNOSIS — R9431 Abnormal electrocardiogram [ECG] [EKG]: Secondary | ICD-10-CM | POA: Diagnosis not present

## 2018-02-25 DIAGNOSIS — I493 Ventricular premature depolarization: Secondary | ICD-10-CM | POA: Diagnosis not present

## 2018-02-25 DIAGNOSIS — I4891 Unspecified atrial fibrillation: Secondary | ICD-10-CM | POA: Diagnosis not present

## 2018-03-08 DIAGNOSIS — Z961 Presence of intraocular lens: Secondary | ICD-10-CM | POA: Diagnosis not present

## 2018-03-08 DIAGNOSIS — H40013 Open angle with borderline findings, low risk, bilateral: Secondary | ICD-10-CM | POA: Diagnosis not present

## 2018-03-15 DIAGNOSIS — I10 Essential (primary) hypertension: Secondary | ICD-10-CM | POA: Diagnosis not present

## 2018-03-15 DIAGNOSIS — M4126 Other idiopathic scoliosis, lumbar region: Secondary | ICD-10-CM | POA: Diagnosis not present

## 2018-03-15 DIAGNOSIS — Z6826 Body mass index (BMI) 26.0-26.9, adult: Secondary | ICD-10-CM | POA: Diagnosis not present

## 2018-03-15 DIAGNOSIS — R531 Weakness: Secondary | ICD-10-CM | POA: Diagnosis not present

## 2018-03-15 DIAGNOSIS — M545 Low back pain: Secondary | ICD-10-CM | POA: Diagnosis not present

## 2018-03-30 DIAGNOSIS — J449 Chronic obstructive pulmonary disease, unspecified: Secondary | ICD-10-CM | POA: Diagnosis not present

## 2018-03-30 DIAGNOSIS — M549 Dorsalgia, unspecified: Secondary | ICD-10-CM | POA: Diagnosis not present

## 2018-03-30 DIAGNOSIS — Z23 Encounter for immunization: Secondary | ICD-10-CM | POA: Diagnosis not present

## 2018-03-30 DIAGNOSIS — M353 Polymyalgia rheumatica: Secondary | ICD-10-CM | POA: Diagnosis not present

## 2018-04-21 DIAGNOSIS — I8310 Varicose veins of unspecified lower extremity with inflammation: Secondary | ICD-10-CM | POA: Diagnosis not present

## 2018-04-21 DIAGNOSIS — I4891 Unspecified atrial fibrillation: Secondary | ICD-10-CM | POA: Diagnosis not present

## 2018-04-21 DIAGNOSIS — R609 Edema, unspecified: Secondary | ICD-10-CM | POA: Diagnosis not present

## 2018-05-18 DIAGNOSIS — R609 Edema, unspecified: Secondary | ICD-10-CM | POA: Diagnosis not present

## 2018-05-18 DIAGNOSIS — M353 Polymyalgia rheumatica: Secondary | ICD-10-CM | POA: Diagnosis not present

## 2018-05-18 DIAGNOSIS — E162 Hypoglycemia, unspecified: Secondary | ICD-10-CM | POA: Diagnosis not present

## 2018-05-25 DIAGNOSIS — I10 Essential (primary) hypertension: Secondary | ICD-10-CM | POA: Diagnosis not present

## 2018-05-25 DIAGNOSIS — I482 Chronic atrial fibrillation, unspecified: Secondary | ICD-10-CM | POA: Diagnosis not present

## 2018-05-25 DIAGNOSIS — E785 Hyperlipidemia, unspecified: Secondary | ICD-10-CM | POA: Diagnosis not present

## 2018-05-27 DIAGNOSIS — B9620 Unspecified Escherichia coli [E. coli] as the cause of diseases classified elsewhere: Secondary | ICD-10-CM | POA: Diagnosis not present

## 2018-05-27 DIAGNOSIS — Z792 Long term (current) use of antibiotics: Secondary | ICD-10-CM | POA: Diagnosis not present

## 2018-05-27 DIAGNOSIS — L7634 Postprocedural seroma of skin and subcutaneous tissue following other procedure: Secondary | ICD-10-CM | POA: Diagnosis not present

## 2018-05-27 DIAGNOSIS — M1990 Unspecified osteoarthritis, unspecified site: Secondary | ICD-10-CM | POA: Diagnosis not present

## 2018-05-27 DIAGNOSIS — R9431 Abnormal electrocardiogram [ECG] [EKG]: Secondary | ICD-10-CM | POA: Diagnosis not present

## 2018-05-27 DIAGNOSIS — R410 Disorientation, unspecified: Secondary | ICD-10-CM | POA: Diagnosis not present

## 2018-05-27 DIAGNOSIS — R000 Tachycardia, unspecified: Secondary | ICD-10-CM | POA: Diagnosis not present

## 2018-05-27 DIAGNOSIS — I4891 Unspecified atrial fibrillation: Secondary | ICD-10-CM | POA: Diagnosis not present

## 2018-05-27 DIAGNOSIS — S8992XA Unspecified injury of left lower leg, initial encounter: Secondary | ICD-10-CM | POA: Diagnosis not present

## 2018-05-27 DIAGNOSIS — I10 Essential (primary) hypertension: Secondary | ICD-10-CM | POA: Diagnosis not present

## 2018-05-27 DIAGNOSIS — L02415 Cutaneous abscess of right lower limb: Secondary | ICD-10-CM | POA: Diagnosis not present

## 2018-05-27 DIAGNOSIS — A401 Sepsis due to streptococcus, group B: Secondary | ICD-10-CM | POA: Diagnosis not present

## 2018-05-27 DIAGNOSIS — N390 Urinary tract infection, site not specified: Secondary | ICD-10-CM | POA: Diagnosis not present

## 2018-05-27 DIAGNOSIS — A419 Sepsis, unspecified organism: Secondary | ICD-10-CM | POA: Diagnosis not present

## 2018-05-27 DIAGNOSIS — R296 Repeated falls: Secondary | ICD-10-CM | POA: Diagnosis not present

## 2018-05-27 DIAGNOSIS — D72829 Elevated white blood cell count, unspecified: Secondary | ICD-10-CM | POA: Diagnosis not present

## 2018-05-27 DIAGNOSIS — S199XXA Unspecified injury of neck, initial encounter: Secondary | ICD-10-CM | POA: Diagnosis not present

## 2018-05-27 DIAGNOSIS — A409 Streptococcal sepsis, unspecified: Secondary | ICD-10-CM | POA: Diagnosis not present

## 2018-05-27 DIAGNOSIS — I959 Hypotension, unspecified: Secondary | ICD-10-CM | POA: Diagnosis not present

## 2018-05-27 DIAGNOSIS — J9601 Acute respiratory failure with hypoxia: Secondary | ICD-10-CM | POA: Diagnosis not present

## 2018-05-27 DIAGNOSIS — W1839XA Other fall on same level, initial encounter: Secondary | ICD-10-CM | POA: Diagnosis not present

## 2018-05-27 DIAGNOSIS — R0689 Other abnormalities of breathing: Secondary | ICD-10-CM | POA: Diagnosis not present

## 2018-05-27 DIAGNOSIS — Z452 Encounter for adjustment and management of vascular access device: Secondary | ICD-10-CM | POA: Diagnosis not present

## 2018-05-27 DIAGNOSIS — R7881 Bacteremia: Secondary | ICD-10-CM | POA: Diagnosis not present

## 2018-05-27 DIAGNOSIS — W06XXXA Fall from bed, initial encounter: Secondary | ICD-10-CM | POA: Diagnosis not present

## 2018-05-27 DIAGNOSIS — R748 Abnormal levels of other serum enzymes: Secondary | ICD-10-CM | POA: Diagnosis not present

## 2018-05-27 DIAGNOSIS — R109 Unspecified abdominal pain: Secondary | ICD-10-CM | POA: Diagnosis not present

## 2018-05-27 DIAGNOSIS — R0602 Shortness of breath: Secondary | ICD-10-CM | POA: Diagnosis not present

## 2018-05-27 DIAGNOSIS — G8929 Other chronic pain: Secondary | ICD-10-CM | POA: Diagnosis not present

## 2018-05-27 DIAGNOSIS — M79662 Pain in left lower leg: Secondary | ICD-10-CM | POA: Diagnosis not present

## 2018-05-27 DIAGNOSIS — Z7901 Long term (current) use of anticoagulants: Secondary | ICD-10-CM | POA: Diagnosis not present

## 2018-05-27 DIAGNOSIS — S3991XA Unspecified injury of abdomen, initial encounter: Secondary | ICD-10-CM | POA: Diagnosis not present

## 2018-05-27 DIAGNOSIS — Z96643 Presence of artificial hip joint, bilateral: Secondary | ICD-10-CM | POA: Diagnosis not present

## 2018-05-27 DIAGNOSIS — S299XXA Unspecified injury of thorax, initial encounter: Secondary | ICD-10-CM | POA: Diagnosis not present

## 2018-05-27 DIAGNOSIS — R6520 Severe sepsis without septic shock: Secondary | ICD-10-CM | POA: Diagnosis not present

## 2018-05-27 DIAGNOSIS — I480 Paroxysmal atrial fibrillation: Secondary | ICD-10-CM | POA: Diagnosis not present

## 2018-05-27 DIAGNOSIS — Y998 Other external cause status: Secondary | ICD-10-CM | POA: Diagnosis not present

## 2018-05-27 DIAGNOSIS — M549 Dorsalgia, unspecified: Secondary | ICD-10-CM | POA: Diagnosis not present

## 2018-06-02 DIAGNOSIS — A401 Sepsis due to streptococcus, group B: Secondary | ICD-10-CM | POA: Diagnosis not present

## 2018-06-02 DIAGNOSIS — R7881 Bacteremia: Secondary | ICD-10-CM | POA: Diagnosis not present

## 2018-06-02 DIAGNOSIS — Z7901 Long term (current) use of anticoagulants: Secondary | ICD-10-CM | POA: Diagnosis not present

## 2018-06-02 DIAGNOSIS — Z452 Encounter for adjustment and management of vascular access device: Secondary | ICD-10-CM | POA: Diagnosis not present

## 2018-06-03 DIAGNOSIS — R7881 Bacteremia: Secondary | ICD-10-CM | POA: Diagnosis not present

## 2018-06-03 DIAGNOSIS — Z452 Encounter for adjustment and management of vascular access device: Secondary | ICD-10-CM | POA: Diagnosis not present

## 2018-06-03 DIAGNOSIS — A401 Sepsis due to streptococcus, group B: Secondary | ICD-10-CM | POA: Diagnosis not present

## 2018-06-03 DIAGNOSIS — Z7901 Long term (current) use of anticoagulants: Secondary | ICD-10-CM | POA: Diagnosis not present

## 2018-06-07 DIAGNOSIS — A419 Sepsis, unspecified organism: Secondary | ICD-10-CM | POA: Diagnosis not present

## 2018-06-07 DIAGNOSIS — R7881 Bacteremia: Secondary | ICD-10-CM | POA: Diagnosis not present

## 2018-06-07 DIAGNOSIS — Z7901 Long term (current) use of anticoagulants: Secondary | ICD-10-CM | POA: Diagnosis not present

## 2018-06-07 DIAGNOSIS — A401 Sepsis due to streptococcus, group B: Secondary | ICD-10-CM | POA: Diagnosis not present

## 2018-06-07 DIAGNOSIS — N390 Urinary tract infection, site not specified: Secondary | ICD-10-CM | POA: Diagnosis not present

## 2018-06-07 DIAGNOSIS — Z452 Encounter for adjustment and management of vascular access device: Secondary | ICD-10-CM | POA: Diagnosis not present

## 2018-06-08 DIAGNOSIS — T148XXA Other injury of unspecified body region, initial encounter: Secondary | ICD-10-CM | POA: Diagnosis not present

## 2018-06-08 DIAGNOSIS — R609 Edema, unspecified: Secondary | ICD-10-CM | POA: Diagnosis not present

## 2018-06-08 DIAGNOSIS — Z8679 Personal history of other diseases of the circulatory system: Secondary | ICD-10-CM | POA: Diagnosis not present

## 2018-06-08 DIAGNOSIS — M353 Polymyalgia rheumatica: Secondary | ICD-10-CM | POA: Diagnosis not present

## 2018-06-11 DIAGNOSIS — Z452 Encounter for adjustment and management of vascular access device: Secondary | ICD-10-CM | POA: Diagnosis not present

## 2018-06-11 DIAGNOSIS — R7881 Bacteremia: Secondary | ICD-10-CM | POA: Diagnosis not present

## 2018-06-11 DIAGNOSIS — Z7901 Long term (current) use of anticoagulants: Secondary | ICD-10-CM | POA: Diagnosis not present

## 2018-06-11 DIAGNOSIS — A401 Sepsis due to streptococcus, group B: Secondary | ICD-10-CM | POA: Diagnosis not present

## 2018-06-11 DIAGNOSIS — Z5181 Encounter for therapeutic drug level monitoring: Secondary | ICD-10-CM | POA: Diagnosis not present

## 2018-06-14 DIAGNOSIS — R7881 Bacteremia: Secondary | ICD-10-CM | POA: Diagnosis not present

## 2018-06-14 DIAGNOSIS — Z7901 Long term (current) use of anticoagulants: Secondary | ICD-10-CM | POA: Diagnosis not present

## 2018-06-14 DIAGNOSIS — Z452 Encounter for adjustment and management of vascular access device: Secondary | ICD-10-CM | POA: Diagnosis not present

## 2018-06-14 DIAGNOSIS — A419 Sepsis, unspecified organism: Secondary | ICD-10-CM | POA: Diagnosis not present

## 2018-06-14 DIAGNOSIS — A401 Sepsis due to streptococcus, group B: Secondary | ICD-10-CM | POA: Diagnosis not present

## 2018-06-21 DIAGNOSIS — Z452 Encounter for adjustment and management of vascular access device: Secondary | ICD-10-CM | POA: Diagnosis not present

## 2018-06-21 DIAGNOSIS — A419 Sepsis, unspecified organism: Secondary | ICD-10-CM | POA: Diagnosis not present

## 2018-06-21 DIAGNOSIS — R7881 Bacteremia: Secondary | ICD-10-CM | POA: Diagnosis not present

## 2018-06-21 DIAGNOSIS — A401 Sepsis due to streptococcus, group B: Secondary | ICD-10-CM | POA: Diagnosis not present

## 2018-06-21 DIAGNOSIS — Z7901 Long term (current) use of anticoagulants: Secondary | ICD-10-CM | POA: Diagnosis not present

## 2018-06-26 DIAGNOSIS — A401 Sepsis due to streptococcus, group B: Secondary | ICD-10-CM | POA: Diagnosis not present

## 2018-06-26 DIAGNOSIS — Z452 Encounter for adjustment and management of vascular access device: Secondary | ICD-10-CM | POA: Diagnosis not present

## 2018-06-26 DIAGNOSIS — R7881 Bacteremia: Secondary | ICD-10-CM | POA: Diagnosis not present

## 2018-06-26 DIAGNOSIS — Z7901 Long term (current) use of anticoagulants: Secondary | ICD-10-CM | POA: Diagnosis not present

## 2018-07-01 DIAGNOSIS — Z7901 Long term (current) use of anticoagulants: Secondary | ICD-10-CM | POA: Diagnosis not present

## 2018-07-01 DIAGNOSIS — Z452 Encounter for adjustment and management of vascular access device: Secondary | ICD-10-CM | POA: Diagnosis not present

## 2018-07-01 DIAGNOSIS — R7881 Bacteremia: Secondary | ICD-10-CM | POA: Diagnosis not present

## 2018-07-01 DIAGNOSIS — A401 Sepsis due to streptococcus, group B: Secondary | ICD-10-CM | POA: Diagnosis not present

## 2018-07-07 DIAGNOSIS — M545 Low back pain: Secondary | ICD-10-CM | POA: Diagnosis not present

## 2018-07-07 DIAGNOSIS — R531 Weakness: Secondary | ICD-10-CM | POA: Diagnosis not present

## 2018-08-04 DIAGNOSIS — M25559 Pain in unspecified hip: Secondary | ICD-10-CM | POA: Diagnosis not present

## 2018-08-04 DIAGNOSIS — M549 Dorsalgia, unspecified: Secondary | ICD-10-CM | POA: Diagnosis not present

## 2018-08-06 ENCOUNTER — Ambulatory Visit
Admission: RE | Admit: 2018-08-06 | Discharge: 2018-08-06 | Disposition: A | Discharge status: Home / Self Care | Payer: Medicare HMO | Source: Ambulatory Visit | Attending: Family Medicine | Admitting: Family Medicine

## 2018-08-06 ENCOUNTER — Other Ambulatory Visit: Payer: Self-pay | Admitting: Family Medicine

## 2018-08-06 DIAGNOSIS — S32010A Wedge compression fracture of first lumbar vertebra, initial encounter for closed fracture: Secondary | ICD-10-CM | POA: Diagnosis not present

## 2018-08-06 DIAGNOSIS — M25551 Pain in right hip: Secondary | ICD-10-CM | POA: Diagnosis not present

## 2018-08-06 DIAGNOSIS — S32030A Wedge compression fracture of third lumbar vertebra, initial encounter for closed fracture: Secondary | ICD-10-CM | POA: Diagnosis not present

## 2018-08-09 DIAGNOSIS — M545 Low back pain: Secondary | ICD-10-CM | POA: Diagnosis not present

## 2018-09-02 DIAGNOSIS — E875 Hyperkalemia: Secondary | ICD-10-CM | POA: Diagnosis not present

## 2018-09-02 DIAGNOSIS — I8310 Varicose veins of unspecified lower extremity with inflammation: Secondary | ICD-10-CM | POA: Diagnosis not present

## 2018-09-07 DIAGNOSIS — I8310 Varicose veins of unspecified lower extremity with inflammation: Secondary | ICD-10-CM | POA: Diagnosis not present

## 2018-09-07 DIAGNOSIS — R609 Edema, unspecified: Secondary | ICD-10-CM | POA: Diagnosis not present

## 2018-09-13 DIAGNOSIS — L97919 Non-pressure chronic ulcer of unspecified part of right lower leg with unspecified severity: Secondary | ICD-10-CM | POA: Diagnosis not present

## 2018-09-13 DIAGNOSIS — R609 Edema, unspecified: Secondary | ICD-10-CM | POA: Diagnosis not present

## 2018-09-20 DIAGNOSIS — M549 Dorsalgia, unspecified: Secondary | ICD-10-CM | POA: Diagnosis not present

## 2018-09-20 DIAGNOSIS — L97909 Non-pressure chronic ulcer of unspecified part of unspecified lower leg with unspecified severity: Secondary | ICD-10-CM | POA: Diagnosis not present

## 2018-09-20 DIAGNOSIS — R1110 Vomiting, unspecified: Secondary | ICD-10-CM | POA: Diagnosis not present

## 2018-09-29 DIAGNOSIS — I10 Essential (primary) hypertension: Secondary | ICD-10-CM | POA: Diagnosis not present

## 2018-09-29 DIAGNOSIS — M545 Low back pain: Secondary | ICD-10-CM | POA: Diagnosis not present

## 2018-10-04 DIAGNOSIS — M5416 Radiculopathy, lumbar region: Secondary | ICD-10-CM | POA: Diagnosis not present

## 2018-11-10 DIAGNOSIS — M48061 Spinal stenosis, lumbar region without neurogenic claudication: Secondary | ICD-10-CM | POA: Diagnosis not present

## 2018-12-01 ENCOUNTER — Other Ambulatory Visit (HOSPITAL_COMMUNITY): Payer: Medicare HMO

## 2018-12-01 ENCOUNTER — Emergency Department (HOSPITAL_COMMUNITY): Payer: Medicare HMO

## 2018-12-01 ENCOUNTER — Inpatient Hospital Stay (HOSPITAL_COMMUNITY)
Admission: EM | Admit: 2018-12-01 | Discharge: 2018-12-10 | DRG: 871 | Disposition: A | Discharge status: Home Health | Payer: Medicare HMO | Attending: Internal Medicine | Admitting: Internal Medicine

## 2018-12-01 ENCOUNTER — Other Ambulatory Visit: Payer: Self-pay

## 2018-12-01 ENCOUNTER — Encounter (HOSPITAL_COMMUNITY): Payer: Self-pay

## 2018-12-01 DIAGNOSIS — R0602 Shortness of breath: Secondary | ICD-10-CM | POA: Diagnosis not present

## 2018-12-01 DIAGNOSIS — A419 Sepsis, unspecified organism: Secondary | ICD-10-CM | POA: Diagnosis present

## 2018-12-01 DIAGNOSIS — Z7901 Long term (current) use of anticoagulants: Secondary | ICD-10-CM

## 2018-12-01 DIAGNOSIS — I872 Venous insufficiency (chronic) (peripheral): Secondary | ICD-10-CM | POA: Diagnosis present

## 2018-12-01 DIAGNOSIS — E785 Hyperlipidemia, unspecified: Secondary | ICD-10-CM | POA: Diagnosis present

## 2018-12-01 DIAGNOSIS — T148XXA Other injury of unspecified body region, initial encounter: Secondary | ICD-10-CM

## 2018-12-01 DIAGNOSIS — I248 Other forms of acute ischemic heart disease: Secondary | ICD-10-CM | POA: Diagnosis present

## 2018-12-01 DIAGNOSIS — I482 Chronic atrial fibrillation, unspecified: Secondary | ICD-10-CM

## 2018-12-01 DIAGNOSIS — E861 Hypovolemia: Secondary | ICD-10-CM | POA: Insufficient documentation

## 2018-12-01 DIAGNOSIS — A4189 Other specified sepsis: Secondary | ICD-10-CM

## 2018-12-01 DIAGNOSIS — I4891 Unspecified atrial fibrillation: Secondary | ICD-10-CM | POA: Diagnosis not present

## 2018-12-01 DIAGNOSIS — M7981 Nontraumatic hematoma of soft tissue: Secondary | ICD-10-CM | POA: Diagnosis not present

## 2018-12-01 DIAGNOSIS — I10 Essential (primary) hypertension: Secondary | ICD-10-CM | POA: Diagnosis present

## 2018-12-01 DIAGNOSIS — I5021 Acute systolic (congestive) heart failure: Secondary | ICD-10-CM

## 2018-12-01 DIAGNOSIS — Z66 Do not resuscitate: Secondary | ICD-10-CM | POA: Diagnosis not present

## 2018-12-01 DIAGNOSIS — N183 Chronic kidney disease, stage 3 (moderate): Secondary | ICD-10-CM | POA: Diagnosis present

## 2018-12-01 DIAGNOSIS — I5043 Acute on chronic combined systolic (congestive) and diastolic (congestive) heart failure: Secondary | ICD-10-CM

## 2018-12-01 DIAGNOSIS — E162 Hypoglycemia, unspecified: Secondary | ICD-10-CM | POA: Diagnosis not present

## 2018-12-01 DIAGNOSIS — R7989 Other specified abnormal findings of blood chemistry: Secondary | ICD-10-CM | POA: Diagnosis not present

## 2018-12-01 DIAGNOSIS — I272 Pulmonary hypertension, unspecified: Secondary | ICD-10-CM | POA: Diagnosis present

## 2018-12-01 DIAGNOSIS — I428 Other cardiomyopathies: Secondary | ICD-10-CM | POA: Diagnosis not present

## 2018-12-01 DIAGNOSIS — R609 Edema, unspecified: Secondary | ICD-10-CM | POA: Diagnosis not present

## 2018-12-01 DIAGNOSIS — D7589 Other specified diseases of blood and blood-forming organs: Secondary | ICD-10-CM | POA: Diagnosis present

## 2018-12-01 DIAGNOSIS — Z888 Allergy status to other drugs, medicaments and biological substances status: Secondary | ICD-10-CM

## 2018-12-01 DIAGNOSIS — Z7952 Long term (current) use of systemic steroids: Secondary | ICD-10-CM

## 2018-12-01 DIAGNOSIS — D62 Acute posthemorrhagic anemia: Secondary | ICD-10-CM | POA: Diagnosis present

## 2018-12-01 DIAGNOSIS — Z85828 Personal history of other malignant neoplasm of skin: Secondary | ICD-10-CM

## 2018-12-01 DIAGNOSIS — R251 Tremor, unspecified: Secondary | ICD-10-CM | POA: Diagnosis present

## 2018-12-01 DIAGNOSIS — R0902 Hypoxemia: Secondary | ICD-10-CM | POA: Diagnosis not present

## 2018-12-01 DIAGNOSIS — R296 Repeated falls: Secondary | ICD-10-CM | POA: Diagnosis present

## 2018-12-01 DIAGNOSIS — I2693 Single subsegmental pulmonary embolism without acute cor pulmonale: Secondary | ICD-10-CM | POA: Diagnosis present

## 2018-12-01 DIAGNOSIS — R778 Other specified abnormalities of plasma proteins: Secondary | ICD-10-CM

## 2018-12-01 DIAGNOSIS — I071 Rheumatic tricuspid insufficiency: Secondary | ICD-10-CM | POA: Diagnosis present

## 2018-12-01 DIAGNOSIS — Z9882 Breast implant status: Secondary | ICD-10-CM

## 2018-12-01 DIAGNOSIS — I959 Hypotension, unspecified: Secondary | ICD-10-CM | POA: Diagnosis not present

## 2018-12-01 DIAGNOSIS — N179 Acute kidney failure, unspecified: Secondary | ICD-10-CM | POA: Diagnosis present

## 2018-12-01 DIAGNOSIS — Z96643 Presence of artificial hip joint, bilateral: Secondary | ICD-10-CM | POA: Diagnosis present

## 2018-12-01 DIAGNOSIS — J3800 Paralysis of vocal cords and larynx, unspecified: Secondary | ICD-10-CM | POA: Diagnosis present

## 2018-12-01 DIAGNOSIS — R6521 Severe sepsis with septic shock: Secondary | ICD-10-CM | POA: Diagnosis not present

## 2018-12-01 DIAGNOSIS — I4819 Other persistent atrial fibrillation: Secondary | ICD-10-CM | POA: Diagnosis present

## 2018-12-01 DIAGNOSIS — I9589 Other hypotension: Secondary | ICD-10-CM

## 2018-12-01 DIAGNOSIS — R570 Cardiogenic shock: Secondary | ICD-10-CM | POA: Diagnosis present

## 2018-12-01 DIAGNOSIS — T45516A Underdosing of anticoagulants, initial encounter: Secondary | ICD-10-CM | POA: Diagnosis present

## 2018-12-01 DIAGNOSIS — I130 Hypertensive heart and chronic kidney disease with heart failure and stage 1 through stage 4 chronic kidney disease, or unspecified chronic kidney disease: Secondary | ICD-10-CM | POA: Diagnosis not present

## 2018-12-01 DIAGNOSIS — Z87891 Personal history of nicotine dependence: Secondary | ICD-10-CM

## 2018-12-01 DIAGNOSIS — S8011XA Contusion of right lower leg, initial encounter: Secondary | ICD-10-CM | POA: Diagnosis not present

## 2018-12-01 DIAGNOSIS — M4800 Spinal stenosis, site unspecified: Secondary | ICD-10-CM | POA: Diagnosis present

## 2018-12-01 DIAGNOSIS — Z79899 Other long term (current) drug therapy: Secondary | ICD-10-CM

## 2018-12-01 DIAGNOSIS — L97211 Non-pressure chronic ulcer of right calf limited to breakdown of skin: Secondary | ICD-10-CM | POA: Diagnosis present

## 2018-12-01 DIAGNOSIS — I5189 Other ill-defined heart diseases: Secondary | ICD-10-CM

## 2018-12-01 DIAGNOSIS — Z20828 Contact with and (suspected) exposure to other viral communicable diseases: Secondary | ICD-10-CM | POA: Diagnosis present

## 2018-12-01 DIAGNOSIS — H409 Unspecified glaucoma: Secondary | ICD-10-CM | POA: Diagnosis present

## 2018-12-01 DIAGNOSIS — J309 Allergic rhinitis, unspecified: Secondary | ICD-10-CM | POA: Diagnosis present

## 2018-12-01 DIAGNOSIS — J9601 Acute respiratory failure with hypoxia: Secondary | ICD-10-CM | POA: Diagnosis not present

## 2018-12-01 DIAGNOSIS — K439 Ventral hernia without obstruction or gangrene: Secondary | ICD-10-CM | POA: Diagnosis not present

## 2018-12-01 DIAGNOSIS — Z452 Encounter for adjustment and management of vascular access device: Secondary | ICD-10-CM

## 2018-12-01 DIAGNOSIS — R6520 Severe sepsis without septic shock: Secondary | ICD-10-CM | POA: Diagnosis present

## 2018-12-01 DIAGNOSIS — R000 Tachycardia, unspecified: Secondary | ICD-10-CM | POA: Diagnosis not present

## 2018-12-01 DIAGNOSIS — I743 Embolism and thrombosis of arteries of the lower extremities: Secondary | ICD-10-CM | POA: Diagnosis not present

## 2018-12-01 DIAGNOSIS — Z91128 Patient's intentional underdosing of medication regimen for other reason: Secondary | ICD-10-CM

## 2018-12-01 LAB — POCT I-STAT 7, (LYTES, BLD GAS, ICA,H+H)
Acid-base deficit: 1 mmol/L (ref 0.0–2.0)
Bicarbonate: 22.3 mmol/L (ref 20.0–28.0)
Calcium, Ion: 1.08 mmol/L — ABNORMAL LOW (ref 1.15–1.40)
HCT: 35 % — ABNORMAL LOW (ref 36.0–46.0)
Hemoglobin: 11.9 g/dL — ABNORMAL LOW (ref 12.0–15.0)
O2 Saturation: 98 %
Patient temperature: 100.9
Potassium: 4.7 mmol/L (ref 3.5–5.1)
Sodium: 137 mmol/L (ref 135–145)
TCO2: 23 mmol/L (ref 22–32)
pCO2 arterial: 35.2 mmHg (ref 32.0–48.0)
pH, Arterial: 7.415 (ref 7.350–7.450)
pO2, Arterial: 118 mmHg — ABNORMAL HIGH (ref 83.0–108.0)

## 2018-12-01 LAB — RESPIRATORY PANEL BY PCR

## 2018-12-01 LAB — URINALYSIS, ROUTINE W REFLEX MICROSCOPIC
Bacteria, UA: NONE SEEN
Bilirubin Urine: NEGATIVE
Glucose, UA: NEGATIVE mg/dL
Hgb urine dipstick: NEGATIVE
Ketones, ur: 5 mg/dL — AB
Leukocytes,Ua: NEGATIVE
Nitrite: NEGATIVE
Protein, ur: 30 mg/dL — AB
Specific Gravity, Urine: 1.024 (ref 1.005–1.030)
pH: 5 (ref 5.0–8.0)

## 2018-12-01 LAB — CBC WITH DIFFERENTIAL/PLATELET
Abs Immature Granulocytes: 0.37 10*3/uL — ABNORMAL HIGH (ref 0.00–0.07)
Basophils Absolute: 0.1 10*3/uL (ref 0.0–0.1)
Basophils Relative: 0 %
Eosinophils Absolute: 0 10*3/uL (ref 0.0–0.5)
Eosinophils Relative: 0 %
HCT: 44.5 % (ref 36.0–46.0)
Hemoglobin: 14.9 g/dL (ref 12.0–15.0)
Immature Granulocytes: 2 %
Lymphocytes Relative: 10 %
Lymphs Abs: 1.6 10*3/uL (ref 0.7–4.0)
MCH: 34.8 pg — ABNORMAL HIGH (ref 26.0–34.0)
MCHC: 33.5 g/dL (ref 30.0–36.0)
MCV: 104 fL — ABNORMAL HIGH (ref 80.0–100.0)
Monocytes Absolute: 0.6 10*3/uL (ref 0.1–1.0)
Monocytes Relative: 4 %
Neutro Abs: 13 10*3/uL — ABNORMAL HIGH (ref 1.7–7.7)
Neutrophils Relative %: 84 %
Platelets: 206 10*3/uL (ref 150–400)
RBC: 4.28 MIL/uL (ref 3.87–5.11)
RDW: 14.2 % (ref 11.5–15.5)
WBC: 15.7 10*3/uL — ABNORMAL HIGH (ref 4.0–10.5)
nRBC: 0.6 % — ABNORMAL HIGH (ref 0.0–0.2)

## 2018-12-01 LAB — BASIC METABOLIC PANEL
Anion gap: 14 (ref 5–15)
BUN: 33 mg/dL — ABNORMAL HIGH (ref 8–23)
CO2: 22 mmol/L (ref 22–32)
Calcium: 8.5 mg/dL — ABNORMAL LOW (ref 8.9–10.3)
Chloride: 102 mmol/L (ref 98–111)
Creatinine, Ser: 1.46 mg/dL — ABNORMAL HIGH (ref 0.44–1.00)
GFR calc Af Amer: 38 mL/min — ABNORMAL LOW (ref >60)
GFR calc non Af Amer: 33 mL/min — ABNORMAL LOW (ref >60)
Glucose, Bld: 130 mg/dL — ABNORMAL HIGH (ref 70–99)
Potassium: 5.3 mmol/L — ABNORMAL HIGH (ref 3.5–5.1)
Sodium: 138 mmol/L (ref 135–145)

## 2018-12-01 LAB — INFLUENZA PANEL BY PCR (TYPE A & B)
Influenza A By PCR: NEGATIVE
Influenza B By PCR: NEGATIVE

## 2018-12-01 LAB — LACTIC ACID, PLASMA
Lactic Acid, Venous: 4.6 mmol/L (ref 0.5–1.9)
Lactic Acid, Venous: 2.6 mmol/L (ref 0.5–1.9)
Lactic Acid, Venous: 2.4 mmol/L (ref 0.5–1.9)
Lactic Acid, Venous: 2.9 mmol/L (ref 0.5–1.9)

## 2018-12-01 LAB — SARS CORONAVIRUS 2 BY RT PCR (HOSPITAL ORDER, PERFORMED IN ~~LOC~~ HOSPITAL LAB): SARS Coronavirus 2: NEGATIVE

## 2018-12-01 LAB — CORTISOL: Cortisol, Plasma: 20.6 ug/dL

## 2018-12-01 LAB — PROCALCITONIN
Procalcitonin: 6.46 ng/mL
Procalcitonin: 7.32 ng/mL

## 2018-12-01 LAB — TROPONIN I
Troponin I: 0.54 ng/mL (ref <0.03)
Troponin I: 0.43 ng/mL (ref <0.03)
Troponin I: 0.32 ng/mL (ref <0.03)
Troponin I: 0.18 ng/mL (ref <0.03)

## 2018-12-01 LAB — BRAIN NATRIURETIC PEPTIDE: B Natriuretic Peptide: 1554.3 pg/mL — ABNORMAL HIGH (ref 0.0–100.0)

## 2018-12-01 LAB — MRSA PCR SCREENING: MRSA by PCR: NEGATIVE

## 2018-12-01 LAB — HEPARIN LEVEL (UNFRACTIONATED): Heparin Unfractionated: 0.96 IU/mL — ABNORMAL HIGH (ref 0.30–0.70)

## 2018-12-01 MED ORDER — HYDROCODONE-ACETAMINOPHEN 5-325 MG PO TABS
1.0000 | ORAL_TABLET | Freq: Four times a day (QID) | ORAL | Status: DC | PRN
  Administered 2018-12-07 – 2018-12-10 (×4): 1 via ORAL
  Filled 2018-12-01 (×4): qty 1

## 2018-12-01 MED ORDER — VANCOMYCIN HCL IN DEXTROSE 1-5 GM/200ML-% IV SOLN
1000.0000 mg | Freq: Once | INTRAVENOUS | Status: AC
  Administered 2018-12-01: 1000 mg via INTRAVENOUS
  Filled 2018-12-01: qty 200

## 2018-12-01 MED ORDER — DOCUSATE SODIUM 100 MG PO CAPS
100.0000 mg | ORAL_CAPSULE | Freq: Two times a day (BID) | ORAL | Status: DC
  Administered 2018-12-01 – 2018-12-09 (×6): 100 mg via ORAL
  Filled 2018-12-01 (×16): qty 1

## 2018-12-01 MED ORDER — POLYVINYL ALCOHOL 1.4 % OP SOLN
1.0000 [drp] | Freq: Four times a day (QID) | OPHTHALMIC | Status: DC | PRN
  Filled 2018-12-01: qty 15

## 2018-12-01 MED ORDER — ACETAMINOPHEN 500 MG PO TABS
1000.0000 mg | ORAL_TABLET | Freq: Once | ORAL | Status: AC
  Administered 2018-12-01: 1000 mg via ORAL
  Filled 2018-12-01: qty 2

## 2018-12-01 MED ORDER — HYDROCORTISONE NA SUCCINATE PF 100 MG IJ SOLR
50.0000 mg | Freq: Four times a day (QID) | INTRAMUSCULAR | Status: DC
  Administered 2018-12-01 – 2018-12-05 (×15): 50 mg via INTRAVENOUS
  Filled 2018-12-01 (×15): qty 2

## 2018-12-01 MED ORDER — AMIODARONE HCL IN DEXTROSE 360-4.14 MG/200ML-% IV SOLN
60.0000 mg/h | INTRAVENOUS | Status: AC
  Administered 2018-12-01: 60 mg/h via INTRAVENOUS
  Filled 2018-12-01: qty 200

## 2018-12-01 MED ORDER — HEPARIN (PORCINE) 25000 UT/250ML-% IV SOLN
750.0000 [IU]/h | INTRAVENOUS | Status: DC
  Administered 2018-12-01: 1000 [IU]/h via INTRAVENOUS
  Filled 2018-12-01: qty 250

## 2018-12-01 MED ORDER — SODIUM CHLORIDE 0.9 % IV SOLN
2.0000 g | INTRAVENOUS | Status: DC
  Filled 2018-12-01: qty 2

## 2018-12-01 MED ORDER — CALCIUM GLUCONATE-NACL 1-0.675 GM/50ML-% IV SOLN
1.0000 g | Freq: Once | INTRAVENOUS | Status: AC
  Administered 2018-12-01: 08:00:00 1000 mg via INTRAVENOUS
  Filled 2018-12-01: qty 50

## 2018-12-01 MED ORDER — METOPROLOL TARTRATE 5 MG/5ML IV SOLN
2.5000 mg | INTRAVENOUS | Status: DC | PRN
  Filled 2018-12-01: qty 5

## 2018-12-01 MED ORDER — METRONIDAZOLE IN NACL 5-0.79 MG/ML-% IV SOLN
500.0000 mg | Freq: Once | INTRAVENOUS | Status: AC
  Administered 2018-12-01: 500 mg via INTRAVENOUS
  Filled 2018-12-01: qty 100

## 2018-12-01 MED ORDER — ACETAMINOPHEN 325 MG PO TABS
650.0000 mg | ORAL_TABLET | Freq: Four times a day (QID) | ORAL | Status: DC | PRN
  Administered 2018-12-05 – 2018-12-09 (×5): 650 mg via ORAL
  Filled 2018-12-01 (×5): qty 2

## 2018-12-01 MED ORDER — AMIODARONE LOAD VIA INFUSION
150.0000 mg | Freq: Once | INTRAVENOUS | Status: AC
  Administered 2018-12-01: 150 mg via INTRAVENOUS
  Filled 2018-12-01: qty 83.34

## 2018-12-01 MED ORDER — AMIODARONE HCL IN DEXTROSE 360-4.14 MG/200ML-% IV SOLN
30.0000 mg/h | INTRAVENOUS | Status: DC
  Administered 2018-12-01 – 2018-12-03 (×5): 30 mg/h via INTRAVENOUS
  Filled 2018-12-01 (×8): qty 200

## 2018-12-01 MED ORDER — POLYETHYLENE GLYCOL 3350 17 G PO PACK: 17.0000 g | PACK | Freq: Every day | ORAL | Status: DC | PRN

## 2018-12-01 MED ORDER — MUPIROCIN 2 % EX OINT
TOPICAL_OINTMENT | Freq: Two times a day (BID) | CUTANEOUS | Status: DC
  Administered 2018-12-02: 21:00:00 via TOPICAL
  Administered 2018-12-02: 1 via TOPICAL
  Administered 2018-12-03 (×2): via TOPICAL
  Administered 2018-12-04: 1 via TOPICAL
  Administered 2018-12-04: 22:00:00 via TOPICAL
  Administered 2018-12-05: 1 via TOPICAL
  Administered 2018-12-05 – 2018-12-10 (×9): via TOPICAL
  Filled 2018-12-01: qty 44
  Filled 2018-12-01 (×5): qty 22

## 2018-12-01 MED ORDER — LATANOPROST 0.005 % OP SOLN
1.0000 [drp] | Freq: Every day | OPHTHALMIC | Status: DC
  Administered 2018-12-03 – 2018-12-09 (×7): 1 [drp] via OPHTHALMIC
  Filled 2018-12-01 (×3): qty 2.5

## 2018-12-01 MED ORDER — SODIUM CHLORIDE 0.9 % IV BOLUS
1000.0000 mL | Freq: Once | INTRAVENOUS | Status: AC
  Administered 2018-12-01: 1000 mL via INTRAVENOUS

## 2018-12-01 MED ORDER — ACETAMINOPHEN 650 MG RE SUPP: 650.0000 mg | Freq: Four times a day (QID) | RECTAL | Status: DC | PRN

## 2018-12-01 MED ORDER — SODIUM CHLORIDE 0.9 % IV SOLN
2.0000 g | Freq: Once | INTRAVENOUS | Status: AC
  Administered 2018-12-01: 2 g via INTRAVENOUS
  Filled 2018-12-01: qty 2

## 2018-12-01 MED ORDER — SODIUM CHLORIDE 0.9% FLUSH
3.0000 mL | Freq: Two times a day (BID) | INTRAVENOUS | Status: DC
  Administered 2018-12-01 – 2018-12-10 (×18): 3 mL via INTRAVENOUS

## 2018-12-01 MED ORDER — VANCOMYCIN HCL IN DEXTROSE 1-5 GM/200ML-% IV SOLN: 1000.0000 mg | INTRAVENOUS | Status: DC

## 2018-12-01 MED ORDER — HYPROMELLOSE (GONIOSCOPIC) 2.5 % OP SOLN
1.0000 [drp] | Freq: Four times a day (QID) | OPHTHALMIC | Status: DC | PRN
  Filled 2018-12-01: qty 15

## 2018-12-01 MED ORDER — ONDANSETRON HCL 4 MG PO TABS: 4.0000 mg | ORAL_TABLET | Freq: Four times a day (QID) | ORAL | Status: DC | PRN

## 2018-12-01 MED ORDER — LACTATED RINGERS IV SOLN
INTRAVENOUS | Status: DC
  Administered 2018-12-01: 08:00:00 via INTRAVENOUS

## 2018-12-01 MED ORDER — HEPARIN BOLUS VIA INFUSION
4000.0000 [IU] | Freq: Once | INTRAVENOUS | Status: AC
  Administered 2018-12-01: 4000 [IU] via INTRAVENOUS
  Filled 2018-12-01: qty 4000

## 2018-12-01 MED ORDER — ONDANSETRON HCL 4 MG/2ML IJ SOLN: 4.0000 mg | Freq: Four times a day (QID) | INTRAMUSCULAR | Status: DC | PRN

## 2018-12-01 NOTE — Consult Note (Signed)
[place] Nurse wound consult note Reason for Consult: LLE chronic nonhealing venous stasis ulcer to right lower medial leg.  Present on admission.  No evidence of infection.  Wound type: chronic nonhealing.  Pressure Injury POA: NA Measurement:2 cm x 2 cm x 0.2 cm  Wound NOM:VEHMC red with minimal serosanguinous drainage no odor Drainage (amount, consistency, odor) see above Periwound:chronic skin changes.  Dressing procedure/placement/frequency: Cleanse right lower leg with NS and pat dry.  Apply mupirocin ointment twice daily.  Cover with dry dressing and kerlix/tape.  Change twice daily.  Will not follow at this time.  Please re-consult if needed.  Domenic Moras MSN, RN, FNP-BC CWON Wound, Ostomy, Continence Nurse Pager [phone]

## 2018-12-01 NOTE — Progress Notes (Signed)
[employer] social research officer called this nurse to say they will not be ordering a send out for Covid 19 at this time, but requested pt still remain on airborne/contact. Charge nurse notified.

## 2018-12-01 NOTE — Consult Note (Signed)
NAME:  Kelsey [name], MRN:  [id number], DOB:  [birth year], LOS: 0 ADMISSION DATE:  12/01/2018, CONSULTATION DATE:  12/01/18 REFERRING MD:  Randal Buba  CHIEF COMPLAINT:  SOB, AF RVRV   Brief History   Kelsey [name] is a 85 y.o. female who was admitted 5/13 with A.fib RVR and concern for sepsis.  History of present illness   Kelsey [name] is a 85 y.o. female who has a PMH as outlined below (see "past medical history").  She presented to [facility] ED via EMS 5/13 with SOB and A.fib with RVR with HR 180 - 200s.  She received 6mg  adenosine and 10mg  metoprolol and HR lowered to around 115. Pt unsure of when A.fib started.  Denied any chest pain, dizziness, lightheadedness, fevers/chills/sweats.  Had some dyspnea on day of presentation.  Had apparently seen cardiology at [facility] and failed DCV and amiodarone in the past (had tremor with amio and due to no response in HR, this was stopped). She has an allergy to cardizem per our system (however, [facility] notes indicate cardizem would be considered if palpitations continued).  Was previously on anticoagulation but not longer is due to hx of falls.  She is on lopressor 100mg  BID as an outpatient.  Past Medical History  PAH, HTN, HLD, dCHF, chronic a.fib failed DCCV, BCC, arthritis.  Significant Hospital Events   5/13 > admit.  Consults:  Cardiology.  Procedures:  None.  Significant Diagnostic Tests:  CXR 5/13 > no acute process.  Micro Data:  Blood 5/13 >  Urine 5/13 >  SARS CoV2 5/13 >   Antimicrobials:  Vanc 5/13 >  Zosyn 5/13 >    Interim history/subjective:  Comfortable.  Does not feel palpitations, state feels like she normally does.  Objective:  Blood pressure 92/73, pulse 94, temperature (!) 100.9 F (38.3 C), temperature source Rectal, resp. rate 20, height 5\' 8"  (1.727 m), weight 72.6 kg, SpO2 (!) 86 %.       No intake or output data in the 24 hours ending 12/01/18 0620 Filed Weights   12/01/18 0308   Weight: 72.6 kg    Examination: General: Elderly female, in NAD. Neuro: A&O x 3, no deficits. HEENT: [place]/AT. Sclerae anicteric.  EOMI. Cardiovascular: Tachy, Kelsey [name], no M/R/G.  Lungs: Respirations even and unlabored.  CTA bilaterally, No W/R/R.  Abdomen: BS x 4, soft, NT/ND.  Musculoskeletal: tenderness to both lower extremities (chronic). Skin: Intact, warm, no rashes.  Assessment & Plan:   SIRS with concern for sepsis - unclear etiology and no hx to support. - Empiric vanc / cefepime for now. - Follow cultures. - Assess PCT, if low consider d/c abx. - Trend lactate.  A.fib with RVR - failed amio and DCCV in the past.  Cardizem allergy per our system. - Cardiology consult placed by EDP, defer rate / rhythm control strategy to them. - Does not require ICU admission at this time, Dr. Gilford Raid has discussed with EDP. - Lopressor PRN. - Trend troponin.  Hx HTN, HLD, dCHF. - Hold preadmission lopressor for now, await cardiology input on dosing / frequency.  Hypocalcemia. - 1g Ca gluconate.  ? Relative AI - on chronic prednisone for unclear reasons. - Assess cortisol, if < 20 start stress steroids.  Will follow and ensure lactate is clearing.  If so, will sign off.  A.fib with RVR per cardiology.   Labs   CBC: Recent Labs  Lab 12/01/18 0317 12/01/18 0509  WBC 15.7*  --   NEUTROABS 13.0*  --  HGB 14.9 11.9*  HCT 44.5 35.0*  MCV 104.0*  --   PLT 206  --    Basic Metabolic Panel: Recent Labs  Lab 12/01/18 0317 12/01/18 0509  NA 138 137  K 5.3* 4.7  CL 102  --   CO2 22  --   GLUCOSE 130*  --   BUN 33*  --   CREATININE 1.46*  --   CALCIUM 8.5*  --    GFR: Estimated Creatinine Clearance: 28.9 mL/min (A) (by C-G formula based on SCr of 1.46 mg/dL (H)). Recent Labs  Lab 12/01/18 0317 12/01/18 0404  WBC 15.7*  --   LATICACIDVEN  --  4.6*   Liver Function Tests: No results for input(s): AST, ALT, ALKPHOS, BILITOT, PROT, ALBUMIN in the last 168 hours. No  results for input(s): LIPASE, AMYLASE in the last 168 hours. No results for input(s): AMMONIA in the last 168 hours. ABG    Component Value Date/Time   PHART 7.415 12/01/2018 0509   PCO2ART 35.2 12/01/2018 0509   PO2ART 118.0 (H) 12/01/2018 0509   HCO3 22.3 12/01/2018 0509   TCO2 23 12/01/2018 0509   ACIDBASEDEF 1.0 12/01/2018 0509   O2SAT 98.0 12/01/2018 0509    Coagulation Profile: No results for input(s): INR, PROTIME in the last 168 hours. Cardiac Enzymes: Recent Labs  Lab 12/01/18 0317  TROPONINI 0.54*   HbA1C: No results found for: HGBA1C CBG: No results for input(s): GLUCAP in the last 168 hours.  Review of Systems:   All negative; except for those that are bolded, which indicate positives.  Constitutional: weight loss, weight gain, night sweats, fevers, chills, fatigue, weakness.  HEENT: headaches, sore throat, sneezing, nasal congestion, post nasal drip, difficulty swallowing, tooth/dental problems, visual complaints, visual changes, ear aches. Neuro: difficulty with speech, weakness, numbness, ataxia. CV:  chest pain, orthopnea, PND, swelling in lower extremities, dizziness, palpitations, syncope.  Resp: cough, hemoptysis, dyspnea, wheezing. GI: heartburn, indigestion, abdominal pain, nausea, vomiting, diarrhea, constipation, change in bowel habits, loss of appetite, hematemesis, melena, hematochezia.  GU: dysuria, change in color of urine, urgency or frequency, flank pain, hematuria. MSK: joint pain or swelling, decreased range of motion. Psych: change in mood or affect, depression, anxiety, suicidal ideations, homicidal ideations. Skin: rash, itching, bruising.   Past medical history  She,  has a past medical history of Arthritis, Cancer ([place]), Chronic anticoagulation, Chronic atrial fibrillation, Diastolic dysfunction, Dyslipidemia, Glaucoma, Hyperlipidemia, Hypertension, Pulmonary HTN ([place]), and Vocal cord paralysis.   Surgical History    Past Surgical  History:  Procedure Laterality Date  . BACK SURGERY     C6-C7 discectomy  . basal cell CA removal    . BREAST SURGERY     breast implants  . CARDIOVERSION N/A 09/06/2012   Procedure: CARDIOVERSION;  Surgeon: [name], MD;  Location: [facility];  Service: Cardiovascular;  Laterality: N/A;  . EYE SURGERY  2011   bilateral cataract surgery  . FACIAL COSMETIC SURGERY    . JOINT REPLACEMENT  2003   bilateral hip replacements  . MUSCLE BIOPSY N/A 08/13/2017   Procedure: LEFT QUADRICEPS MUSCLE BIOPSY;  Surgeon: [name], MD;  Location: [place];  Service: General;  Laterality: N/A;     Social History   reports that she has quit smoking. She has never used smokeless tobacco. She reports current alcohol use. She reports that she does not use drugs.   Family history   Her family history includes Alzheimer's disease in her mother.   Allergies Allergies  Allergen Reactions  . Cardizem [Diltiazem Hcl] Swelling  . Clonidine Derivatives Swelling  . Doxazosin Swelling     Home meds  Prior to Admission medications   Medication Sig Start Date End Date Taking? Authorizing Provider  furosemide (LASIX) 40 MG tablet Take 40 mg by mouth daily as needed for fluid.    Yes [provider]  HYDROcodone-acetaminophen (NORCO/VICODIN) 5-325 MG tablet Take 1 tablet by mouth every 6 (six) hours as needed for moderate pain.   Yes [provider]  hydroxypropyl methylcellulose / hypromellose (ISOPTO TEARS / GONIOVISC) 2.5 % ophthalmic solution Place 1 drop into both eyes 4 (four) times daily as needed for dry eyes.   Yes [provider]  latanoprost (XALATAN) 0.005 % ophthalmic solution Place 1 drop into both eyes at bedtime.   Yes [provider]  metoprolol tartrate (LOPRESSOR) 50 MG tablet Take 100 mg by mouth 2 (two) times daily.    Yes [provider]  neomycin-bacitracin-polymyxin (NEOSPORIN) ointment Apply 1 application topically as needed for  wound care.   Yes [provider]  predniSONE (DELTASONE) 10 MG tablet Take 10 mg by mouth daily with breakfast.   Yes [provider]    Montey Hora, [place] Pager: [phone].  If no answer, [phone] 12/01/2018, 6:20 AM

## 2018-12-01 NOTE — ED Notes (Signed)
Date and time results received: 12/01/18 0434 (use smartphrase ".now" to insert current time)  Test: Troponin  Critical Value: 0.54  Name of Provider Notified: [place]

## 2018-12-01 NOTE — Progress Notes (Signed)
Pharmacy Antibiotic Note  Kelsey [name] is a 85 y.o. female admitted on 12/01/2018 with SOB/possibel sepsis.  Pharmacy has been consulted for Vancomycin and Cefepime dosing.  Vancomycin 1 g IV given in ED at 0500  Plan: Vancomycin 1 g IV q48h Cefepime 2 g IV q24h  Height: 5\' 8"  (172.7 cm) Weight: 160 lb (72.6 kg) IBW/kg (Calculated) : 63.9  Temp (24hrs), Avg:100.4 F (38 C), Min:97.6 F (36.4 C), Max:102.8 F (39.3 C)  Recent Labs  Lab 12/01/18 0317 12/01/18 0404  WBC 15.7*  --   CREATININE 1.46*  --   LATICACIDVEN  --  4.6*    Estimated Creatinine Clearance: 28.9 mL/min (A) (by C-G formula based on SCr of 1.46 mg/dL (H)).    Allergies  Allergen Reactions  . Cardizem [Diltiazem Hcl] Swelling  . Clonidine Derivatives Swelling  . Doxazosin Swelling    Kelsey [name] 12/01/2018 6:48 AM

## 2018-12-01 NOTE — Progress Notes (Signed)
ANTICOAGULATION CONSULT NOTE Pharmacy Consult:  Heparin Indication: atrial fibrillation  Allergies  Allergen Reactions  . Cardizem [Diltiazem Hcl] Swelling  . Clonidine Derivatives Swelling  . Doxazosin Swelling    Patient Measurements: Height: 5\' 8"  (172.7 cm) Weight: 160 lb (72.6 kg) IBW/kg (Calculated) : 63.9 Heparin Dosing Weight: 72 kg  Vital Signs: Temp: 97.4 F (36.3 C) (05/13 1500) BP: 128/78 (05/13 1800) Pulse Rate: 105 (05/13 1800)  Labs: Recent Labs    12/01/18 0317 12/01/18 0509 12/01/18 0711 12/01/18 1245 12/01/18 1837  HGB 14.9 11.9*  --   --   --   HCT 44.5 35.0*  --   --   --   PLT 206  --   --   --   --   HEPARINUNFRC  --   --   --   --  0.96*  CREATININE 1.46*  --   --   --   --   TROPONINI 0.54*  --  0.43* 0.32*  --     Estimated Creatinine Clearance: 28.9 mL/min (A) (by C-G formula based on SCr of 1.46 mg/dL (H)).   Medical History: Past Medical History:  Diagnosis Date  . Arthritis    DJD  . Cancer ([place])    basal cell CA  . Chronic anticoagulation    on Xarelto  . Chronic atrial fibrillation    s/p failed DCCV now on rate control  . Diastolic dysfunction    by echo 07/2012  . Glaucoma   . Hyperlipidemia   . Hypertension   . Pulmonary HTN ([facility])    mild - RVSP 40-27mmHg  . Vocal cord paralysis       Assessment: 74 YOF presented with SOB, found to be in Afib with RVR.  Pharmacy consulted to idose IV heparin.  Per documentation, patient was taken off of anticoagulation in the past due to fall risk.   -heparin level= 0.96 on 1000 units/hr  Goal of Therapy:  Heparin level 0.3-0.7 units/ml Monitor platelets by anticoagulation protocol: Yes   Plan:   Decrease heparin to 750 units/hr Daily heparin level and CBC  Hildred Laser, PharmD Clinical Pharmacist **Pharmacist phone directory can now be found on [email or web address] ([email or web address]).  Listed under [place].

## 2018-12-01 NOTE — ED Notes (Signed)
Attempted to wean pt off of nrb with 3L [place].  RR increased to 28 and breathing became very labored.  NRB replaced.

## 2018-12-01 NOTE — ED Notes (Signed)
Admitting MD at bedside.

## 2018-12-01 NOTE — ED Provider Notes (Signed)
[place] [facility] EMERGENCY DEPARTMENT Provider Note   CSN: [id number] Arrival date & time: 12/01/18  0257    History   Chief Complaint Chief Complaint  Patient presents with  . Atrial Fibrillation    HPI Kelsey [name] is a 85 y.o. female.     The history is provided by the patient and medical records.    85 year old female with history of A. Fib, dyslipidemia, HLP, HTN, pulmonary HTN, presenting to the ED for AFIB.  Patient reports history of PAF (however chart notes chronic since September 2019).  States for the past few days she has been feeling a little fatigued.  She denies ever feeling her episodes of AFIB so not exactly sure when this started.  She denies any chest pain, dizziness, or weakness.  She has felt a little SOB.  She denies cough or sick contacts.  No known COVID exposures.  She follows with [facility]ology at [facility], Dr. [name].  Per last notes, she has failed ECV and amiodarone in the past, has allergy to cardizem (swelling).  She is currently on rate control therapy only.  She is not on anticoagulation due to frequent falls.  On arrival to ED, patient with rectal temp of 102.90F.  Past Medical History:  Diagnosis Date  . Arthritis    DJD  . Cancer ([place])    basal cell CA  . Chronic anticoagulation    on Xarelto  . Chronic atrial fibrillation    s/p failed DCCV now on rate control  . Diastolic dysfunction    by echo 07/2012  . Dyslipidemia   . Glaucoma   . Hyperlipidemia   . Hypertension   . Pulmonary HTN (HCC)    mild - RVSP 40-63mmHg  . Vocal cord paralysis     Patient Active Problem List   Diagnosis Date Noted  . Chronic cough 03/07/2014  . Atrial fibrillation, chronic 03/07/2014  . Chronic anticoagulation 05/09/2013  . Diastolic dysfunction   . Vocal cord polyp 05/08/2013  . PSVT (paroxysmal supraventricular tachycardia) ([place]) 05/08/2013  . Allergic rhinitis 05/08/2013  . Hypertension   . Dyslipidemia   . Cancer ([place])   .  Arthritis   . Pulmonary HTN ([place])     Past Surgical History:  Procedure Laterality Date  . BACK SURGERY     C6-C7 discectomy  . basal cell CA removal    . BREAST SURGERY     breast implants  . CARDIOVERSION N/A 09/06/2012   Procedure: CARDIOVERSION;  Surgeon: [name], MD;  Location: [facility];  Service: Cardiovascular;  Laterality: N/A;  . EYE SURGERY  2011   bilateral cataract surgery  . FACIAL COSMETIC SURGERY    . JOINT REPLACEMENT  2003   bilateral hip replacements  . MUSCLE BIOPSY N/A 08/13/2017   Procedure: LEFT QUADRICEPS MUSCLE BIOPSY;  Surgeon: [name], MD;  Location: [facility];  Service: General;  Laterality: N/A;     OB History   No obstetric history on file.      Home Medications    Prior to Admission medications   Medication Sig Start Date End Date Taking? Authorizing Provider  amiodarone (PACERONE) 200 MG tablet Take 200 mg by mouth every evening.     [provider]  furosemide (LASIX) 40 MG tablet Take 40 mg by mouth daily. May take a second 40 mg dose as needed for swelling    [provider]  HYDROcodone-acetaminophen (NORCO/VICODIN) 5-325 MG tablet Take 1 tablet by mouth every 6 (  six) hours as needed for moderate pain.    [provider]  latanoprost (XALATAN) 0.005 % ophthalmic solution Place 1 drop into both eyes at bedtime.    [provider]  losartan (COZAAR) 100 MG tablet TAKE ONE TABLET BY MOUTH ONCE DAILY **PATIENT IS OVERDUE FOR AN APPOINTMENT PLEASE CALL AND SCHEDULE FOR FURTHER REFILLS** 03/13/16   [name], MD  metoprolol tartrate (LOPRESSOR) 50 MG tablet Take 25 mg by mouth 2 (two) times daily.    [provider]  neomycin-bacitracin-polymyxin (NEOSPORIN) ointment Apply 1 application topically as needed for wound care.    [provider]  Omega-3 Fatty Acids (OMEGA 3 PO) Take 600 mg by mouth every evening.    [provider]  XARELTO 20 MG TABS tablet TAKE ONE  TABLET BY MOUTH ONCE DAILY WITH SUPPER 04/04/15   [name], MD    Family History Family History  Problem Relation Age of Onset  . Alzheimer's disease Mother     Social History Social History   Tobacco Use  . Smoking status: Former Research scientist (life sciences)  . Smokeless tobacco: Never Used  Substance Use Topics  . Alcohol use: Yes    Comment: occasional wine  . Drug use: No     Allergies   Cardizem [diltiazem hcl]; Clonidine derivatives; and Doxazosin   Review of Systems Review of Systems  Cardiovascular:       AFIB  All other systems reviewed and are negative.    Physical Exam Updated Vital Signs Temp 97.6 F (36.4 C) (Oral)   Ht 5\' 8"  (1.727 m)   Wt 72.6 kg   BMI 24.33 kg/m   Physical Exam Vitals signs and nursing note reviewed.  Constitutional:      Appearance: She is well-developed.     Comments: Awake and alert when spoken to, otherwise seems to drift off and become a little somnolent  HENT:     Head: Normocephalic and atraumatic.  Eyes:     Conjunctiva/sclera: Conjunctivae normal.     Pupils: Pupils are equal, round, and reactive to light.  Neck:     Musculoskeletal: Normal range of motion.  Cardiovascular:     Rate and Rhythm: Tachycardia present. Rhythm irregularly irregular.     Heart sounds: Normal heart sounds.     Comments: AFIB rate around 112 during exam Pulmonary:     Effort: Pulmonary effort is normal.     Breath sounds: Normal breath sounds.     Comments: Some increased WOB noted but able to speak and answer questions, lungs overall clear Abdominal:     General: Bowel sounds are normal.     Palpations: Abdomen is soft.     Tenderness: There is no abdominal tenderness. There is no right CVA tenderness, left CVA tenderness or guarding.  Musculoskeletal: Normal range of motion.     Comments: 1+ pitting edema at the ankles  Skin:    General: Skin is warm and dry.  Neurological:     Mental Status: She is alert and oriented to person, place, and  time.     Comments: No rash or overlying skin changes      ED Treatments / Results  Labs (all labs ordered are listed, but only abnormal results are displayed) Labs Reviewed  CBC WITH DIFFERENTIAL/PLATELET - Abnormal; Notable for the following components:      Result Value   WBC 15.7 (*)    MCV 104.0 (*)    MCH 34.8 (*)    nRBC  0.6 (*)    Neutro Abs 13.0 (*)    Abs Immature Granulocytes 0.37 (*)    All other components within normal limits  BASIC METABOLIC PANEL - Abnormal; Notable for the following components:   Potassium 5.3 (*)    Glucose, Bld 130 (*)    BUN 33 (*)    Creatinine, Ser 1.46 (*)    Calcium 8.5 (*)    GFR calc non Af Amer 33 (*)    GFR calc Af Amer 38 (*)    All other components within normal limits  TROPONIN I - Abnormal; Notable for the following components:   Troponin I 0.54 (*)    All other components within normal limits  BRAIN NATRIURETIC PEPTIDE - Abnormal; Notable for the following components:   B Natriuretic Peptide 1,554.3 (*)    All other components within normal limits  LACTIC ACID, PLASMA - Abnormal; Notable for the following components:   Lactic Acid, Venous 4.6 (*)    All other components within normal limits  URINALYSIS, ROUTINE W REFLEX MICROSCOPIC - Abnormal; Notable for the following components:   Color, Urine AMBER (*)    APPearance HAZY (*)    Ketones, ur 5 (*)    Protein, ur 30 (*)    All other components within normal limits  POCT I-STAT 7, (LYTES, BLD GAS, ICA,H+H) - Abnormal; Notable for the following components:   pO2, Arterial 118.0 (*)    Calcium, Ion 1.08 (*)    HCT 35.0 (*)    Hemoglobin 11.9 (*)    All other components within normal limits  SARS CORONAVIRUS 2 (HOSPITAL ORDER, [place] [facility])  CULTURE, BLOOD (ROUTINE X 2)  CULTURE, BLOOD (ROUTINE X 2)  URINE CULTURE  LACTIC ACID, PLASMA  LACTIC ACID, PLASMA  PROCALCITONIN  TROPONIN I  TROPONIN I  TROPONIN I  CORTISOL    EKG EKG  Interpretation  Date/Time:  Wednesday Dec 01 2018 03:04:35 EDT Ventricular Rate:  119 PR Interval:    QRS Duration: 70 QT Interval:  404 QTC Calculation: 564 R Axis:   17 Text Interpretation:  Atrial fibrillation Low voltage, precordial leads Prolonged QT interval Confirmed by Dory Horn) on 12/01/2018 3:16:38 AM   Radiology Dg Chest Port 1 View  Result Date: 12/01/2018 CLINICAL DATA:  85 year old female with shortness of breath. EXAM: PORTABLE CHEST 1 VIEW COMPARISON:  Chest radiograph dated 05/27/2018 FINDINGS: There is diffuse interstitial coarsening and chronic bronchitic changes. No focal consolidation, pleural effusion, or pneumothorax. Stable moderate cardiomegaly. Atherosclerotic calcification of the aortic arch. No acute osseous pathology. Bilateral breast implants. IMPRESSION: 1. No acute cardiopulmonary process. 2. Stable cardiomegaly. Electronically Signed   By: Anner Crete M.D.   On: 12/01/2018 03:38    Procedures Procedures (including critical care time)  CRITICAL CARE Performed by: Larene Pickett   Total critical care time: 60 minutes  Critical care time was exclusive of separately billable procedures and treating other patients.  Critical care was necessary to treat or prevent imminent or life-threatening deterioration.  Critical care was time spent personally by me on the following activities: development of treatment plan with patient and/or surrogate as well as nursing, discussions with consultants, evaluation of patient's response to treatment, examination of patient, obtaining history from patient or surrogate, ordering and performing treatments and interventions, ordering and review of laboratory studies, ordering and review of radiographic studies, pulse oximetry and re-evaluation of patient's condition.   Medications Ordered in ED Medications  metoprolol tartrate (LOPRESSOR)  injection 2.5-5 mg (has no administration in time range)  calcium  gluconate 1 g/ 50 mL sodium chloride IVPB (has no administration in time range)  lactated ringers infusion (has no administration in time range)  vancomycin (VANCOCIN) IVPB 1000 mg/200 mL premix (has no administration in time range)  ceFEPIme (MAXIPIME) 2 g in sodium chloride 0.9 % 100 mL IVPB (has no administration in time range)  acetaminophen (TYLENOL) tablet 1,000 mg (1,000 mg Oral Given 12/01/18 0340)  sodium chloride 0.9 % bolus 1,000 mL (1,000 mLs Intravenous New Bag/Given 12/01/18 0345)  sodium chloride 0.9 % bolus 1,000 mL (1,000 mLs Intravenous New Bag/Given 12/01/18 0531)  metroNIDAZOLE (FLAGYL) IVPB 500 mg (500 mg Intravenous New Bag/Given 12/01/18 0503)  ceFEPIme (MAXIPIME) 2 g in sodium chloride 0.9 % 100 mL IVPB (2 g Intravenous New Bag/Given 12/01/18 0505)  vancomycin (VANCOCIN) IVPB 1000 mg/200 mL premix (1,000 mg Intravenous New Bag/Given 12/01/18 0508)     Initial Impression / Assessment and Plan / ED Course  I have reviewed the triage vital signs and the nursing notes.  Pertinent labs & imaging results that were available during my care of the patient were reviewed by me and considered in my medical decision making (see chart for details).  85 y.o. F here with AFIB.  This is a chronic issue, however rate uncontrolled tonight.  EMS reported rate up to 180-200-- was given adenosine then 10mg  metoprolol and now rate around 120 on arrival.   Patient is no longer anticoagulated due to fall risk.  She denies chest pain.  Has had some SOB and weakness.  Found to be febrile here to 102.32F rectally.  HR remains around 112 during my exam.  Rate may be uncontrolled due to fever.  She has not responded well to electric cardioversion or antiarrhythmics in the past, her treatment at this time is opted to rate control per last cardiology note.  She is not currently on anticoagulation. chadsvasc 5.  Will proceed with infectious work-up as well as cardiac.  COVID screen added.    Labs with  leukocytosis of 15.7K.  Bump in SrCr noted.  Troponin also elevated at 0.54 however patient continues to deny chest pain.  EKG without acute ischemic changes, but does reveal prolonged QT at 564.  IVF infusing, remainder of work-up pending.  4:52 AM Patient lactate is 4.6.  She will be declared code sepsis, given 30cc/kg bolus, start abx but source remains unknown at this time as CXR and UA clear.  RR increased, remains alert and oriented.  Will continue to monitor closely.  5:18 AM Patients BP dropped a bit into the 25'D systolic, additional IVF given and improved to low 664'Q systolic, last pressure 034/74.  HR now increased to around 130's, remains in AFIB.  COVID test is negative.  ABG obtained.  Will discuss with critical care for admission.  I did discuss with patient about her code status, she would want intubation, compressions, pressors, etc.  She is to be FULL CODE.  6:32 AM Dr. Gilford Raid with critical care has evaluated and feels she is stable for hospitalist admission.  They will provide consult note.  Requested repeat lactate which is being drawn now.  hospitalist paged along with cardiology.  6:41 AM Spoke with cardiology, Dr. Radford Pax-- they will evaluate and provide recommendations for her AFIB as this has been very difficulty to management in the past with limited response to ECV and antiarrhythmics.  Anticoagulation risk will also need to be considered as this  was stopped by her cardiologist.  6:52 AM Discussed with Dr. Blaine Hamper-- morning team to admit, but is concerned she will need ICU admission.  Morning team will evaluate, touch base with ICU.  Oncoming ED team made aware of patient and current status.  Final Clinical Impressions(s) / ED Diagnoses   Final diagnoses:  Sepsis due to other etiology [facility])  Atrial fibrillation with RVR ([place])  Elevated troponin    ED Discharge Orders    None       Larene Pickett, PA-C 12/01/18 0703    Palumbo, April, MD 12/01/18 [phone]

## 2018-12-01 NOTE — ED Triage Notes (Addendum)
Pt arrived via [facility]; pt from [facility] with c/o SOB; EMS reports pt in AFIB RVR 180-200; pt rec'd 6mg  Adenosine; 10mg  of Metoprolol; 98/64, 94% on NRB; no CBG; 99.8 T; pt not on blood thinners, stopped taking Xarelto in Nov

## 2018-12-01 NOTE — Progress Notes (Signed)
Daily Progress Note  Received report from [place], [place]. Patient arrived on floor at 1530 in NAD. Noted to be on 15L NRB FM --> weaned patient to 2LPM humidified [place]. CHG bath completed. Noted to have small LLE abrasion with epidermal layer sloughing. Placed on telemetry and continuous SPO2 monitoring. MRSA swab sent which was (-). Given IV steroids, new amiodarone bag hung at new rate of 16.59m/hr. In early evening during shift change heparin rate changed to 7.511mhr. Awaiting orders for novel coronavirus from primary team. Per my bedside assessment on feet with FWW appear unsteady would benefit from a PT/OT evaluation. All patient care needs met.

## 2018-12-01 NOTE — ED Notes (Signed)
Requested amio from pharmacy

## 2018-12-01 NOTE — ED Notes (Addendum)
Pt does not want tray. Orange juice given.

## 2018-12-01 NOTE — ED Notes (Signed)
[place] pts husband would like an update

## 2018-12-01 NOTE — Progress Notes (Signed)
Pt with complaints about blood pressure cuff being to tight. Appropriate size BP cuff is on pt's arm. Other arm tried, different BP cuff tried, and different dinamap tried with pt having complaints. Pt instructed that Bps must be taken frequently since pt is on amiodarone drip. Pt also with complaints of the dinamap alarming. Pt instructed that alarms are on for pt's safety. Pt verbalizes understanding.

## 2018-12-01 NOTE — ED Notes (Signed)
Nurse spoke with patient and called husband for her who spoke with each other on speaker phone.

## 2018-12-01 NOTE — ED Provider Notes (Signed)
Signout from Dr. Nicholes Stairs.  85 year old female here with rapid A. fib and fever.  Lactate elevated.  Hypotensive and tachycardic.  ICU has evaluated the patient do not feel she needs ICU level care.  Medicine has been consulted for admission.  Antibiotics have been started.  Cardiology has also been consulted and this consult is pending.  History of A. fib that is been refractory to cardioversion in the past. Physical Exam  BP 120/84   Pulse 94   Temp (!) 100.9 F (38.3 C) (Rectal)   Resp (!) 25   Ht 5\' 8"  (1.727 m)   Wt 72.6 kg   SpO2 (!) 86%   BMI 24.33 kg/m   Physical Exam  ED Course/Procedures     Procedures  MDM  Plan is to continue rate control and await disposition between stepdown versus ICU.       Hayden Rasmussen, MD 12/02/18 [phone]

## 2018-12-01 NOTE — Progress Notes (Signed)
ANTICOAGULATION CONSULT NOTE - Initial Consult  Pharmacy Consult:  Heparin Indication: atrial fibrillation  Allergies  Allergen Reactions  . Cardizem [Diltiazem Hcl] Swelling  . Clonidine Derivatives Swelling  . Doxazosin Swelling    Patient Measurements: Height: 5\' 8"  (172.7 cm) Weight: 160 lb (72.6 kg) IBW/kg (Calculated) : 63.9 Heparin Dosing Weight: 72 kg  Vital Signs: Temp: 100.9 F (38.3 C) (05/13 0457) Temp Source: Rectal (05/13 0457) BP: 88/75 (05/13 0730) Pulse Rate: 124 (05/13 0730)  Labs: Recent Labs    12/01/18 0317 12/01/18 0509 12/01/18 0711  HGB 14.9 11.9*  --   HCT 44.5 35.0*  --   PLT 206  --   --   CREATININE 1.46*  --   --   TROPONINI 0.54*  --  0.43*    Estimated Creatinine Clearance: 28.9 mL/min (A) (by C-G formula based on SCr of 1.46 mg/dL (H)).   Medical History: Past Medical History:  Diagnosis Date  . Arthritis    DJD  . Cancer ([place])    basal cell CA  . Chronic anticoagulation    on Xarelto  . Chronic atrial fibrillation    s/p failed DCCV now on rate control  . Diastolic dysfunction    by echo 07/2012  . Glaucoma   . Hyperlipidemia   . Hypertension   . Pulmonary HTN ([facility])    mild - RVSP 40-47mmHg  . Vocal cord paralysis       Assessment: 37 YOF presented with SOB, found to be in Afib with RVR.  Pharmacy consulted to initiate IV heparin.  Per documentation, patient was taken off of anticoagulation in the past due to fall risk.  Baseline labs reviewed.  Goal of Therapy:  Heparin level 0.3-0.7 units/ml Monitor platelets by anticoagulation protocol: Yes   Plan:  Heparin 4000 units IV x 1, then Heparin gtt at 1000 units/hr Check 8 hr heparin level Daily heparin level and CBC F/U [facility] plans  Sheralyn Pinegar D. Mina Marble, PharmD, BCPS, [place] 12/01/2018, 9:11 AM

## 2018-12-01 NOTE — ED Notes (Signed)
Called bed placement.  They stated pt is 2nd in line for step-down bed.

## 2018-12-01 NOTE — ED Notes (Signed)
Spoke with husband Monica Martinez and gave an updated of plan of care and currently waiting for a bed to be assigned. Nurse will call when patient is transferred to room.

## 2018-12-01 NOTE — ED Notes (Signed)
Nurse updated husband Suezanne Jacquet [phone] with plan of care. Husband thanked Marine scientist for update.

## 2018-12-01 NOTE — Consult Note (Signed)
Cardiology Consultation:   Patient ID: Kelsey [name]; [id number]; 1932/01/11   Admit date: 12/01/2018 Date of Consult: 12/01/2018  Primary Care Provider: [name], MD Primary Cardiologist: No primary care provider on file. Primary Electrophysiologist:  None   Patient Profile:   Kelsey [name] is a 85 y.o. female with a PMH of HTN, HLD, pulmonary HTN, atrial fibrillation not on anticoagulation, vocal cord paralysis, and chronic prednisone use for unknown reason, who is being seen today for the evaluation of atrial fibrillation with RVR at the request of Dr. [name].  History of Present Illness:   [name] presented with myalgias which started 11/30/2018 after she woke up from a nap. Her symptoms progressed throughout the day and she felt some SOB and was unable to move prompting her to activate EMS. On EMS arrival, her EKG revealed tachycardia with rates 180-200 for which she was given adenosine, then 10mg  IV metoprolol with improvement in rate to 110 and underlying rhythm of atrial fibrillation with RVR. On arrival to the ED she was febrile to 102.8 with leukocytosis and elevated lactate, therefore code sepsis was activated. Of note, she had a similar presentation 05/2018 where she was found to have strep bacteremia and an Ecoli UTI.   Patient follows with Dr. [name] at [facility] for cardiology care. She has a history of atrial fibrillation with past failed cardioversion and amiodarone was discontinued due to tremor and lack of HR response. Her rates have been managed with BBlockers. At some point over the past couple months she stopped her xarelto due to "blotches on her hands". Diltiazem is listed as an allergy in Epic. Last echo 05/2018 showed EF 55-60%, normal LV diastolic function, moderate TR, and mild pulm HTN.   Hospital course: febrile to 102., tachycardic to 110s-140s, tachypneic, hypotensive, and intermittently hypoxic.  Labs notable for K 5.3>4.7, Cr 1.46, WBC 15.7, Hgb 14.9>11.9 (dilutional), PLT 206, BNP 1554, Trop 0.54>0.43, Lactate 4.6>2.6, procal 6.46, cortisol wnl. UA overall bland with ketones and protein but no bacteria. BCx/UCx pending. COVID-19 negative. CXR without acute findings. EKG with atrial fibrillation with RVR, rate 119, QTC 564, no STE/D, non-specific T wave abnormalities. PCCM revaluated patient and recommended close monitoring, cardiology consult, and admission to hospitalist. Patient was started on IV antibiotics for sepsis. Cardiology asked to evaluate for Afib and cardiogenic shock.   Past Medical History:  Diagnosis Date   Arthritis    DJD   Cancer ([place])    basal cell CA   Chronic anticoagulation    on Xarelto   Chronic atrial fibrillation    s/p failed DCCV now on rate control   Diastolic dysfunction    by echo 07/2012   Glaucoma    Hyperlipidemia    Hypertension    Pulmonary HTN ([facility])    mild - RVSP 40-20mmHg   Vocal cord paralysis     Past Surgical History:  Procedure Laterality Date   BACK SURGERY     C6-C7 discectomy   basal cell CA removal     BREAST SURGERY     breast implants   CARDIOVERSION N/A 09/06/2012   Procedure: CARDIOVERSION;  Surgeon: [name], MD;  Location: [facility];  Service: Cardiovascular;  Laterality: N/A;   EYE SURGERY  2011   bilateral cataract surgery   FACIAL COSMETIC SURGERY     JOINT REPLACEMENT  2003   bilateral hip replacements   MUSCLE BIOPSY N/A 08/13/2017   Procedure: LEFT QUADRICEPS MUSCLE BIOPSY;  Surgeon: [name], MD;  Location: [facility];  Service: General;  Laterality: N/A;     Home Medications:  Prior to Admission medications   Medication Sig Start Date End Date Taking? Authorizing Provider  furosemide (LASIX) 40 MG tablet Take 40 mg by mouth daily as needed for fluid.    Yes [provider]  HYDROcodone-acetaminophen (NORCO/VICODIN) 5-325 MG tablet Take 1 tablet by mouth every 6 (six)  hours as needed for moderate pain.   Yes [provider]  hydroxypropyl methylcellulose / hypromellose (ISOPTO TEARS / GONIOVISC) 2.5 % ophthalmic solution Place 1 drop into both eyes 4 (four) times daily as needed for dry eyes.   Yes [provider]  latanoprost (XALATAN) 0.005 % ophthalmic solution Place 1 drop into both eyes at bedtime.   Yes [provider]  metoprolol tartrate (LOPRESSOR) 50 MG tablet Take 100 mg by mouth 2 (two) times daily.    Yes [provider]  neomycin-bacitracin-polymyxin (NEOSPORIN) ointment Apply 1 application topically as needed for wound care.   Yes [provider]  predniSONE (DELTASONE) 10 MG tablet Take 10 mg by mouth daily with breakfast.   Yes [provider]    Inpatient Medications: Scheduled Meds:  docusate sodium  100 mg Oral BID   hydrocortisone sod succinate (SOLU-CORTEF) inj  50 mg Intravenous Q6H   latanoprost  1 drop Both Eyes QHS   sodium chloride flush  3 mL Intravenous Q12H   Continuous Infusions:  [START ON 12/02/2018] ceFEPime (MAXIPIME) IV     [START ON 12/03/2018] vancomycin     PRN Meds: acetaminophen **OR** acetaminophen, HYDROcodone-acetaminophen, hydroxypropyl methylcellulose / hypromellose, ondansetron **OR** ondansetron (ZOFRAN) IV, polyethylene glycol  Allergies:    Allergies  Allergen Reactions   Cardizem [Diltiazem Hcl] Swelling   Clonidine Derivatives Swelling   Doxazosin Swelling    Social History:   Social History   Socioeconomic History   Marital status: Married    Spouse name: Not on file   Number of children: Not on file   Years of education: Not on file   Highest education level: Not on file  Occupational History   Not on file  Social Needs   Financial resource strain: Not on file   Food insecurity:    Worry: Not on file    Inability: Not on file   Transportation needs:    Medical: Not on file    Non-medical: Not on file  Tobacco  Use   Smoking status: Former Smoker   Smokeless tobacco: Never Used  Substance and Sexual Activity   Alcohol use: Yes    Comment: occasional wine   Drug use: No   Sexual activity: Not on file  Lifestyle   Physical activity:    Days per week: Not on file    Minutes per session: Not on file   Stress: Not on file  Relationships   Social connections:    Talks on phone: Not on file    Gets together: Not on file    Attends religious service: Not on file    Active member of club or organization: Not on file    Attends meetings of clubs or organizations: Not on file    Relationship status: Not on file   Intimate partner violence:    Fear of current or ex partner: Not on file    Emotionally abused: Not on file    Physically abused: Not on file    Forced sexual activity: Not on file  Other  Topics Concern   Not on file  Social History Narrative   Not on file    Family History:    Family History  Problem Relation Age of Onset   Alzheimer's disease Mother      ROS:  Please see the history of present illness.   All other ROS reviewed and negative.     Physical Exam/Data:   Vitals:   12/01/18 0645 12/01/18 0700 12/01/18 0715 12/01/18 0730  BP: 99/71 92/67 90/78  (!) 88/75  Pulse: (!) 116 (!) 124 (!) 115 (!) 124  Resp: (!) 24 (!) 23 (!) 21 (!) 21  Temp:      TempSrc:      SpO2: 100% 100% 99% 100%  Weight:      Height:        Intake/Output Summary (Last 24 hours) at 12/01/2018 0904 Last data filed at 12/01/2018 0745 Gross per 24 hour  Intake 2387.97 ml  Output --  Net 2387.97 ml   Filed Weights   12/01/18 0308  Weight: 72.6 kg   Body mass index is 24.33 kg/m.   Physical exam per MD:  General:  Well nourished, well developed, ill appearing HEENT: sclera anicteric  Lymph: no adenopathy Neck: no JVD Endocrine:  No thryomegaly Vascular: No carotid bruits; distal pulses 2+ bilaterally Cardiac:  normal S1, S2; irregular, tachy,; no murmur  Lungs:   Coarse breath sounds to auscultation bilaterally, mild wheezing,   Abd: NABS, soft, nontender, no hepatomegaly Ext: bilateral pitting edema Musculoskeletal:  No deformities, BUE and BLE strength normal and equal Skin: warm and dry  Neuro:  CNs 2-12 intact, no focal abnormalities noted Psych:  Normal affect   EKG:  The EKG was personally reviewed and demonstrates:  atrial fibrillation with RVR, rate 119, QTC 564, no STE/D, non-specific T wave abnormalities. Telemetry:  Telemetry was personally reviewed and demonstrates:  AFib, RVR  Relevant CV Studies: Echocardiogram 05/2018: Summary Mild concentric left ventricular hypertrophy Normal left ventricular size and systolic function with no appreciable segmental abnormality. Ejection fraction is estimated at 97-67% Diastolic function appears normal There is trace aortic sclerosis noted, with no evidence of stenosis. Trace aortic regurgitation. Non-specific mitral leaflet thickening with preserved mobility. Mild mitral regurgitation. Moderate tricuspid regurgitation. Mild pulmonary hypertension.  Laboratory Data:  Chemistry Recent Labs  Lab 12/01/18 0317 12/01/18 0509  NA 138 137  K 5.3* 4.7  CL 102  --   CO2 22  --   GLUCOSE 130*  --   BUN 33*  --   CREATININE 1.46*  --   CALCIUM 8.5*  --   GFRNONAA 33*  --   GFRAA 38*  --   ANIONGAP 14  --     No results for input(s): PROT, ALBUMIN, AST, ALT, ALKPHOS, BILITOT in the last 168 hours. Hematology Recent Labs  Lab 12/01/18 0317 12/01/18 0509  WBC 15.7*  --   RBC 4.28  --   HGB 14.9 11.9*  HCT 44.5 35.0*  MCV 104.0*  --   MCH 34.8*  --   MCHC 33.5  --   RDW 14.2  --   PLT 206  --    Cardiac Enzymes Recent Labs  Lab 12/01/18 0317 12/01/18 0711  TROPONINI 0.54* 0.43*   No results for input(s): TROPIPOC in the last 168 hours.  BNP Recent Labs  Lab 12/01/18 0317  BNP 1,554.3*    DDimer No results for input(s): DDIMER in the last 168  hours.  Radiology/Studies:  Dg Chest Port 1  View  Result Date: 12/01/2018 CLINICAL DATA:  85 year old female with shortness of breath. EXAM: PORTABLE CHEST 1 VIEW COMPARISON:  Chest radiograph dated 05/27/2018 FINDINGS: There is diffuse interstitial coarsening and chronic bronchitic changes. No focal consolidation, pleural effusion, or pneumothorax. Stable moderate cardiomegaly. Atherosclerotic calcification of the aortic arch. No acute osseous pathology. Bilateral breast implants. IMPRESSION: 1. No acute cardiopulmonary process. 2. Stable cardiomegaly. Electronically Signed   By: Anner Crete M.D.   On: 12/01/2018 03:38    Assessment and Plan:   1. Shock (sepsis vs cardiogenic): patient presented with SOB and malaise. She was tachycardic, tachypneic, febriles, and hypotensive on arrival. Labs with leukocytosis, elevated lactate, elevated procal, elevated troponin (peaked 0.54), BNP 1500s, elevated Cr 1.45 (baseline 1.2) with a normal cortisol. UA without bacteria/leuks/nitrates. BCx/UCx pending. She was started on IV antibiotics. EKG without ischemic findings to suggest ACS. PCCM following. S/p 3L NS for fluid resuscitation.  - Will follow-up echo results to assess for LV dysfunction or wall motion abnormalities to suggest ACS - Continue antibiotic per primary team - Will continue to monitor BP closely - Monitor volume status closely with fluid resuscitation   2. Persistent atrial fibrillation: failed prior DCCV. Inteolerant to amiodarone with little response in HR and patient developed a tremor. Also with reported swelling with diltiazem. Patient self d/c'd xarelto several months ago due to "blotches on hands". Limited in ability to use BBlocker for rate control as patient is hypotensive. She received 10mg  IV metoprolol prior to arrival with improvement in HR from 200s to 110s. She was started on a heparin gtt given CHA2DS2-VASc Score of at least 4 (HTN, Female, and Age >71). - Will start  amiodarone gtt to use short term in the setting of acute illness - If BP improves, can use prn IV metoprolol in small doses for rate control - Continue heparin gtt for stroke ppx - will have to discuss risks/benefits of AC once acute illness resolves. Would consider age/renally adjusted eliquis at discharge.   3. Elevated troponin: trop trend 0.54>0.43. EKG without ischemic changes. She presented with Afib RVR (rates initially up to 200s on EMS arrival, improved to 120s) and sepsis (hypotensive, febrile, and tachypneic). Likely demand ischemia in the setting of sepsis and afib RVR.  - Will follow-up echo results to assess for LV dysfunction or wall motion abnormalities to suggest ACS  4. HTN: persistent hypotension despite fluid resuscitation. - Continue holding home metoprolol and prn lasix   For questions or updates, please contact [facility] Please consult [email or web address] for contact info under Cardiology/STEMI.   Signed, Abigail Butts, PA-C  12/01/2018 9:04 AM [phone]  I have examined the patient and reviewed assessment and plan and discussed with patient.  Agree with above as stated.  AFib with RVR.    Low BP limits choices for rate control.  She would prefer metoprolol, but I explained to her that IV Amio would be a better choice.  She had a tremor with PO Amio in the past, but is willing to try IV Amio.   I personally reviewed her chest xray.  Will need to watch for evidence of pulmonary edema.  May need to treat with Lasix if BP stabilizes.  CXR showed vascular prominence but this has been noted on prior ECG.   I doubt this is cardiogenic shock.  I think more likely related to an infection.  AFib is likely a response to illness. MIldly elevated troponin from demand ischemia.   Larae Grooms

## 2018-12-01 NOTE — H&P (Signed)
History and Physical    Kelsey [name] JIR:[id number] DOB: February 15, 1932 DOA: 12/01/2018  PCP: [name], MD Consultants:  [name] - cardiology Patient coming from:  Home - lives with husband; NOK: Rikki Spearing, [place]  Chief Complaint: fever  HPI: Kelsey [name] is a 85 y.o. female with medical history significant of pulmonary HTN; HTN; HLD; vocal cord paralysis;  Diastolic dysfunction; and afib previously on Xarelto presenting with myalgias.  I spoke with her husband who reports that she woke up from a nap, she just didn't feel good and she kept getting worse. She wasn't able to move.  She was somewhat SOB.  No cough or respiratory.  No urinary complaints.  No n/v/d.  She was a little subjectively warm.  She has been off Xarelto "for a pretty good while"; she had "blood splotches on her hands" that they attributed to the Xarelto and so she stopped taking it - he thinks this was months ago.  The patient describes generalized myalgias preventing her from getting out of bed.  She has spinal stenosis and is normally sedentary but is able to move with a walker.  She was admitted in 11/19 in [facility] with a similar presentation.  She was unable to get out of bed and was found to have sepsis with WBC 18,000, temp 102, and lactate >3.  She was found to have Strep bacteremia and an E coli UTI.  She was treated with home Rocephin for 4 weeks.  She has a h/o refractory PAF which was controlled on metoprolol.  Her Xarelto was resumed at the time of d/c.    ED Course:  Carryover per Dr. Blaine Hamper:  85 year old lady with a past medical history of atrial fibrillation not on anticoagulants, hypertension, hyperlipidemia, CHF with EF 55-60%, pulmonary hypertension, vocal cord paralysis, on chronic prednisone 10 mg daily for unknown reason, who presents with shortness of breath, found to have atrial fibrillation with RVR with heart rate up to 80-200. She received 6mg  adenosine and 10mg   metoprolol and HR lowered to around 110s. No CP. Had apparently seen cardiology at [facility] and failed DCV and amiodarone in the past (had tremor with amio and due to no response in HR, this was stopped). She has an allergy to cardizem per our system (however, [facility] notes indicate cardizem would be considered if palpitations continued).   Pt was found to have troponin 0.54, leukocytosis with WBC 15.7, BNP 1554, lactic acid of 4.6, negative urinalysis, negative COVID-19 test, temperature 100.9, initial blood pressure 77/54 which improved to 94/77 after 3 L of NS.  Chest x-ray negative.  ABG with pH 7.415, PCO2 35, PO2 118 (non-breather).  Patient has sepsis with unclear source of infection.  Patient was given 1 dose of vancomycin, cefepime and Flagyl. Card, Dr. Radford Pax was consulted by EDP, will see pt as consult. ED physician also consulted PCCM, they did not think patient need to go to ICU, but pt seems to be very sick. I will hold admission order. Admitting MD may need to evaluation pt first to decide if pt should come to our service.   Review of Systems: As per HPI; otherwise review of systems reviewed and negative.   Ambulatory Status:  Ambulates with a walker, but generally sedentary  Past Medical History:  Diagnosis Date   Arthritis    DJD   Cancer ([place])    basal cell CA   Chronic anticoagulation    on Xarelto   Chronic atrial fibrillation  s/p failed DCCV now on rate control   Diastolic dysfunction    by echo 07/2012   Glaucoma    Hyperlipidemia    Hypertension    Pulmonary HTN (HCC)    mild - RVSP 40-46mmHg   Vocal cord paralysis     Past Surgical History:  Procedure Laterality Date   BACK SURGERY     C6-C7 discectomy   basal cell CA removal     BREAST SURGERY     breast implants   CARDIOVERSION N/A 09/06/2012   Procedure: CARDIOVERSION;  Surgeon: [name], MD;  Location: [facility];  Service: Cardiovascular;  Laterality: N/A;   EYE SURGERY  2011    bilateral cataract surgery   FACIAL COSMETIC SURGERY     JOINT REPLACEMENT  2003   bilateral hip replacements   MUSCLE BIOPSY N/A 08/13/2017   Procedure: LEFT QUADRICEPS MUSCLE BIOPSY;  Surgeon: [name], MD;  Location: [facility];  Service: General;  Laterality: N/A;    Social History   Socioeconomic History   Marital status: Married    Spouse name: Not on file   Number of children: Not on file   Years of education: Not on file   Highest education level: Not on file  Occupational History   Not on file  Social Needs   Financial resource strain: Not on file   Food insecurity:    Worry: Not on file    Inability: Not on file   Transportation needs:    Medical: Not on file    Non-medical: Not on file  Tobacco Use   Smoking status: Former Smoker   Smokeless tobacco: Never Used  Substance and Sexual Activity   Alcohol use: Yes    Comment: occasional wine   Drug use: No   Sexual activity: Not on file  Lifestyle   Physical activity:    Days per week: Not on file    Minutes per session: Not on file   Stress: Not on file  Relationships   Social connections:    Talks on phone: Not on file    Gets together: Not on file    Attends religious service: Not on file    Active member of club or organization: Not on file    Attends meetings of clubs or organizations: Not on file    Relationship status: Not on file   Intimate partner violence:    Fear of current or ex partner: Not on file    Emotionally abused: Not on file    Physically abused: Not on file    Forced sexual activity: Not on file  Other Topics Concern   Not on file  Social History Narrative   Not on file    Allergies  Allergen Reactions   Cardizem [Diltiazem Hcl] Swelling   Clonidine Derivatives Swelling   Doxazosin Swelling    Family History  Problem Relation Age of Onset   Alzheimer's disease Mother     Prior to Admission medications   Medication Sig Start Date End Date  Taking? Authorizing Provider  furosemide (LASIX) 40 MG tablet Take 40 mg by mouth daily as needed for fluid.    Yes [provider]  HYDROcodone-acetaminophen (NORCO/VICODIN) 5-325 MG tablet Take 1 tablet by mouth every 6 (six) hours as needed for moderate pain.   Yes [provider]  hydroxypropyl methylcellulose / hypromellose (ISOPTO TEARS / GONIOVISC) 2.5 % ophthalmic solution Place 1 drop into both eyes 4 (four) times daily as needed for dry  eyes.   Yes [provider]  latanoprost (XALATAN) 0.005 % ophthalmic solution Place 1 drop into both eyes at bedtime.   Yes [provider]  metoprolol tartrate (LOPRESSOR) 50 MG tablet Take 100 mg by mouth 2 (two) times daily.    Yes [provider]  neomycin-bacitracin-polymyxin (NEOSPORIN) ointment Apply 1 application topically as needed for wound care.   Yes [provider]  predniSONE (DELTASONE) 10 MG tablet Take 10 mg by mouth daily with breakfast.   Yes [provider]    Physical Exam: Vitals:   12/01/18 0645 12/01/18 0700 12/01/18 0715 12/01/18 0730  BP: 99/71 92/67 90/78  (!) 88/75  Pulse: (!) 116 (!) 124 (!) 115 (!) 124  Resp: (!) 24 (!) 23 (!) 21 (!) 21  Temp:      TempSrc:      SpO2: 100% 100% 99% 100%  Weight:      Height:          General:  Appears ill, with tachypnea, on NRB O2  Eyes:  PERRL, EOMI, normal lids, iris  ENT:  grossly normal hearing, lips & tongue, mmm  Neck:  no LAD, masses or thyromegaly  Cardiovascular:  Irregularly irregular with persistent tachycardia to 120, no m/r/g.  1+ LE edema  Respiratory:   Scattered rhonchi with increased WOB, persistent tachypnea  Abdomen:  soft, NT, ND, NABS  Skin:  Chronic stasis dermatitis with LLE medial ulcer with oozing base of purulent drainage but it is fairly superficial and without surrounding erythema  Musculoskeletal:  Somewhat artophic BUE < BLE, good ROM, no bony abnormality  Psychiatric:   blunted mood and affect, speech fluent and appropriate, AOx3  Neurologic:  CN 2-12 grossly intact, moves all extremities in coordinated fashion, sensation intact    Radiological Exams on Admission: Dg Chest Port 1 View  Result Date: 12/01/2018 CLINICAL DATA:  85 year old female with shortness of breath. EXAM: PORTABLE CHEST 1 VIEW COMPARISON:  Chest radiograph dated 05/27/2018 FINDINGS: There is diffuse interstitial coarsening and chronic bronchitic changes. No focal consolidation, pleural effusion, or pneumothorax. Stable moderate cardiomegaly. Atherosclerotic calcification of the aortic arch. No acute osseous pathology. Bilateral breast implants. IMPRESSION: 1. No acute cardiopulmonary process. 2. Stable cardiomegaly. Electronically Signed   By: Anner Crete M.D.   On: 12/01/2018 03:38    EKG: Independently reviewed.  Afib with rate 119; prolonged QT 564; nonspecific ST changes with no evidence of acute ischemia   Labs on Admission: I have personally reviewed the available labs and imaging studies at the time of the admission.  Pertinent labs:   K+ 5.3 Glucose 130 BUN 33/Creatinine 1.46/GFR 38 BNP 1554.3 Troponin 0.54 WBC 15.7 UA: 5 ketones, 30 protein Blood cultures x2 pending Urine culture pending COVID negative Lactate 4.6 ABG: 7.415/35.2/118  Assessment/Plan Principal Problem:   Severe sepsis (HCC) Active Problems:   Hypertension   Diastolic dysfunction   Chronic atrial fibrillation with RVR   Cardiogenic shock (HCC)   Shock, likely mixed septic and cardiogenic -Patient presenting from home with sepsis physiology as well as apparent CHF with refractory afib -This is a very medically complex patient -Cardiology has been asked to consult and will see the patient soon -PCCM consulted this AM and will see the patient again this AM to assist with management -For now, will admit to SDU for close ongoing management  Sepsis -SIRS criteria in this patient includes:  Leukocytosis, fever, tachycardia, tachypnea, hypoxia  -Patient has evidence of acute organ failure with elevated lactate and  hypotension -While awaiting blood cultures, this appears to be a preseptic condition. -Sepsis protocol initiated -Patient had initial lactate >4 or SBP <90/MAP <65 and so has received the 30 cc/kg IVF bolus. -Suspected source is unknown.  Her CXR was read as normal, although there are diffuse opacities that could be related to fluid overload or underlying infiltrates, or could be chronic changes.  Her UA is unremarkable.  She has a h/o Strep bacteremia in 11/19 from an unknown source and so recurrent bacteremia is a clear consideration. -She was negative for COVID and yet has an ongoing NRB requirement and so will continue on contact/airborne precautions until a clear source has been identified. -Blood and urine cultures pending -Will add flu and RVP; consider repeating COVID testing if no other source is identified. -Will admit due to: hemodynamic instability; recent h/o bacteremia; hypoxia -Treat with IV Cefepime/Vanc for undifferentiated sepsis -Will trend lactate to ensure improvement -Will order lower respiratory tract procalcitonin level.  Antibiotics would not be indicated for PCT <0.1 and probably should not be used for < 0.25.  >0.5 indicates infection and >>0.5 indicates more serious disease.  As the procalcitonin level normalizes, it will be reasonable to consider de-escalation of antibiotic coverage.  Afib with RVR with cardiogenic shock -Patient with refractory afib, continued RVR in 120 range -This is complicated by her persistent hypotension despite 3L IVF -Her CXR was mildly concerning for volume overload and her BNP is markedly elevated -For now, will hold additional IVF and also not give Lasix -Cardiology consult is pending -She has a h/o tremors from Amiodarone, and this medication was also not helpful in managing her HR -She has a h/o "swelling" from  Diltiazem -Lopressor has been most helpful in managing her RVR in the past, but this is problematic at this time due to her tenuous blood pressures -She has also failed DCCV in the past -The best option may be to judiciously give Lopressor as BP will tolerate and allow mild ongoing RVR for now - but will defer to cardiology -Meanwhile, she stopped taking her Xarelto due to "blotches" on her hands; she is also a fall risk and she chronic AC may not be her best option -For now, will start on Heparin drip and closely monitor and the long-term AC question can be answered once she is more stable -Her troponin is elevated, which is likely from demand ischemia and cardiogenic shock; as noted above, she is on Heparin for now and will continue to trend troponin  Chronic diastolic dysfunction -Echo in 11/19 with preserved EF, normal diastolic function, and mild pulmonary HTN -I do not see evidence of a BNP during that hospitalization -I also do not see evidence of a TEE during that hospitalization -Prior echo in 9/16 was also normal; TEE was performed at that time, presumably for DCCV  HTN -Hold Lasix -Hold Lopressor    DVT prophylaxis:  Heparin drip for now Code Status:  DNR - confirmed with patient Family Communication: None present; I spoke with the patient's husband by telephone Disposition Plan:  Home once clinically improved Consults called: PCCM; Cardiology  Admission status: Admit - It is my clinical opinion that admission to INPATIENT is reasonable and necessary because of the expectation that this patient will require hospital care that crosses at least 2 midnights to treat this condition based on the medical complexity of the problems presented.  Given the aforementioned information, the predictability of an adverse outcome is felt to be significant.   Total critical  care time: 75 minutes Critical care time was exclusive of separately billable procedures and treating other  patients. Critical care was necessary to treat or prevent imminent or life-threatening deterioration. Critical care was time spent personally by me on the following activities: development of treatment plan with patient and/or surrogate as well as nursing, discussions with consultants, evaluation of patient's response to treatment, examination of patient, obtaining history from patient or surrogate, ordering and performing treatments and interventions, ordering and review of laboratory studies, ordering and review of radiographic studies, pulse oximetry and re-evaluation of patient's condition.   Karmen Bongo MD [facility]   How to contact the [facility] Attending or Consulting provider [place] or covering provider during after hours [place], for this patient?  1. Check the care team in [facility] and look for a) attending/consulting [facility] provider listed and b) the [facility] team listed 2. Log into [email or web address] and use [place]'s universal password to access. If you do not have the password, please contact the hospital operator. 3. Locate the [facility] provider you are looking for under [facility] and page to a number that you can be directly reached. 4. If you still have difficulty reaching the provider, please page the [facility] (Director on Call) for the Hospitalists listed on amion for assistance.   12/01/2018, 9:00 AM

## 2018-12-01 NOTE — ED Notes (Signed)
Spoke with lab and able to add flu and respiratory panel with existing swab in lab. Sent both requisition forms via tube station.

## 2018-12-02 ENCOUNTER — Inpatient Hospital Stay (HOSPITAL_COMMUNITY): Payer: Medicare HMO

## 2018-12-02 ENCOUNTER — Inpatient Hospital Stay: Payer: Self-pay

## 2018-12-02 DIAGNOSIS — R6520 Severe sepsis without septic shock: Secondary | ICD-10-CM

## 2018-12-02 DIAGNOSIS — I4891 Unspecified atrial fibrillation: Secondary | ICD-10-CM

## 2018-12-02 DIAGNOSIS — A419 Sepsis, unspecified organism: Principal | ICD-10-CM

## 2018-12-02 DIAGNOSIS — I5189 Other ill-defined heart diseases: Secondary | ICD-10-CM

## 2018-12-02 LAB — CBC
HCT: 38.9 % (ref 36.0–46.0)
Hemoglobin: 12.8 g/dL (ref 12.0–15.0)
MCH: 34.1 pg — ABNORMAL HIGH (ref 26.0–34.0)
MCHC: 32.9 g/dL (ref 30.0–36.0)
MCV: 103.7 fL — ABNORMAL HIGH (ref 80.0–100.0)
Platelets: 158 10*3/uL (ref 150–400)
RBC: 3.75 MIL/uL — ABNORMAL LOW (ref 3.87–5.11)
RDW: 14.2 % (ref 11.5–15.5)
WBC: 12.6 10*3/uL — ABNORMAL HIGH (ref 4.0–10.5)
nRBC: 0 % (ref 0.0–0.2)

## 2018-12-02 LAB — LACTIC ACID, PLASMA
Lactic Acid, Venous: 3.9 mmol/L (ref 0.5–1.9)
Lactic Acid, Venous: 2.9 mmol/L (ref 0.5–1.9)

## 2018-12-02 LAB — BASIC METABOLIC PANEL
Anion gap: 9 (ref 5–15)
BUN: 28 mg/dL — ABNORMAL HIGH (ref 8–23)
CO2: 22 mmol/L (ref 22–32)
Calcium: 8.2 mg/dL — ABNORMAL LOW (ref 8.9–10.3)
Chloride: 108 mmol/L (ref 98–111)
Creatinine, Ser: 0.91 mg/dL (ref 0.44–1.00)
GFR calc Af Amer: >60 mL/min (ref >60)
GFR calc non Af Amer: 58 mL/min — ABNORMAL LOW (ref >60)
Glucose, Bld: 134 mg/dL — ABNORMAL HIGH (ref 70–99)
Potassium: 4 mmol/L (ref 3.5–5.1)
Sodium: 139 mmol/L (ref 135–145)

## 2018-12-02 LAB — URINE CULTURE: Culture: NO GROWTH

## 2018-12-02 LAB — HEPARIN LEVEL (UNFRACTIONATED)
Heparin Unfractionated: 0.49 IU/mL (ref 0.30–0.70)
Heparin Unfractionated: <0.1 IU/mL — ABNORMAL LOW (ref 0.30–0.70)

## 2018-12-02 LAB — PROCALCITONIN: Procalcitonin: 5.78 ng/mL

## 2018-12-02 MED ORDER — SODIUM CHLORIDE 0.9 % IV SOLN
2.0000 g | Freq: Two times a day (BID) | INTRAVENOUS | Status: DC
  Administered 2018-12-02 – 2018-12-04 (×5): 2 g via INTRAVENOUS
  Filled 2018-12-02 (×7): qty 2

## 2018-12-02 MED ORDER — ENOXAPARIN SODIUM 80 MG/0.8ML ~~LOC~~ SOLN
70.0000 mg | Freq: Two times a day (BID) | SUBCUTANEOUS | Status: DC
  Administered 2018-12-02 – 2018-12-07 (×9): 70 mg via SUBCUTANEOUS
  Filled 2018-12-02 (×10): qty 0.8

## 2018-12-02 MED ORDER — METOPROLOL TARTRATE 5 MG/5ML IV SOLN
5.0000 mg | Freq: Once | INTRAVENOUS | Status: AC
  Administered 2018-12-02: 5 mg via INTRAVENOUS
  Filled 2018-12-02: qty 5

## 2018-12-02 MED ORDER — LORAZEPAM 2 MG/ML IJ SOLN
1.0000 mg | Freq: Once | INTRAMUSCULAR | Status: AC
  Administered 2018-12-02: 1 mg via INTRAVENOUS
  Filled 2018-12-02: qty 1

## 2018-12-02 MED ORDER — VANCOMYCIN HCL IN DEXTROSE 1-5 GM/200ML-% IV SOLN
1000.0000 mg | INTRAVENOUS | Status: DC
  Administered 2018-12-02 – 2018-12-03 (×2): 1000 mg via INTRAVENOUS
  Filled 2018-12-02 (×3): qty 200

## 2018-12-02 MED ORDER — SODIUM CHLORIDE 0.9 % IV BOLUS
500.0000 mL | Freq: Once | INTRAVENOUS | Status: AC
  Administered 2018-12-02: 500 mL via INTRAVENOUS

## 2018-12-02 MED ORDER — FUROSEMIDE 10 MG/ML IJ SOLN
40.0000 mg | Freq: Once | INTRAMUSCULAR | Status: AC
  Administered 2018-12-02: 40 mg via INTRAVENOUS
  Filled 2018-12-02: qty 4

## 2018-12-02 MED ORDER — SODIUM CHLORIDE 0.9 % IV BOLUS
500.0000 mL | Freq: Once | INTRAVENOUS | Status: AC
  Administered 2018-12-03: 500 mL via INTRAVENOUS

## 2018-12-02 MED ORDER — ZOLPIDEM TARTRATE 5 MG PO TABS
5.0000 mg | ORAL_TABLET | Freq: Once | ORAL | Status: AC
  Administered 2018-12-02: 5 mg via ORAL
  Filled 2018-12-02: qty 1

## 2018-12-02 NOTE — Progress Notes (Signed)
Attempt to place PICC line using left basilic. Vein accessed but unable to thread guidewire due to patient becoming restless and increased heart rate (160's).  Patient stated " Im beginning to feel uneasy" PICC placement was stopped at that point. Patient has had 2 previous attempts  today by IV/VAST for peripheral IV and was unsuccessful.  MD notified.  Recommendation was made for CVC placement or PICC placement via IR.  Patient RN made aware.

## 2018-12-02 NOTE — Progress Notes (Signed)
ANTICOAGULATION CONSULT NOTE Pharmacy Consult:  Heparin Indication: atrial fibrillation  Allergies  Allergen Reactions  . Cardizem [Diltiazem Hcl] Swelling  . Clonidine Derivatives Swelling  . Doxazosin Swelling   Patient Measurements: Height: 5\' 8"  (172.7 cm) Weight: 160 lb (72.6 kg) IBW/kg (Calculated) : 63.9 Heparin Dosing Weight: 72 kg  Vital Signs: Temp: 98.1 F (36.7 C) (05/13 2348) Temp Source: Oral (05/13 2348) BP: 137/104 (05/14 0800) Pulse Rate: 126 (05/14 0800)  Labs: Recent Labs    12/01/18 0317 12/01/18 0509 12/01/18 0711 12/01/18 1245 12/01/18 1837 12/02/18 0407  HGB 14.9 11.9*  --   --   --  12.8  HCT 44.5 35.0*  --   --   --  38.9  PLT 206  --   --   --   --  158  HEPARINUNFRC  --   --   --   --  0.96* 0.49  CREATININE 1.46*  --   --   --   --  0.91  TROPONINI 0.54*  --  0.43* 0.32* 0.18*  --    Estimated Creatinine Clearance: 46.4 mL/min (by C-G formula based on SCr of 0.91 mg/dL).  Medical History: Past Medical History:  Diagnosis Date  . Arthritis    DJD  . Cancer ([place])    basal cell CA  . Chronic anticoagulation    on Xarelto  . Chronic atrial fibrillation    s/p failed DCCV now on rate control  . Diastolic dysfunction    by echo 07/2012  . Glaucoma   . Hyperlipidemia   . Hypertension   . Pulmonary HTN (HCC)    mild - RVSP 40-85mmHg  . Vocal cord paralysis    Assessment: 27 YOF presented with SOB, found to be in Afib with RVR.  Pharmacy consulted to idose IV heparin.  Per documentation, patient was taken off of anticoagulation in the past due to fall risk.   -heparin level= 0.96 on 1000 units/hr > reduced to 750 units/hr, Hep level resulted 0.49 units/ml  Goal of Therapy:  Heparin level 0.3-0.7 units/ml Monitor platelets by anticoagulation protocol: Yes   Today, 12/02/2018 1300 Hep level invalid, Heparin had been off since 11:30 am One attempt by IV team unsuccessful, currently attempting again at 1600   Plan:   Resume  Heparin when IV access attained Daily heparin level and CBC  Nyoka Cowden, Rona Ravens PharmD Clinical Pharmacist **Pharmacist phone directory can now be found on [email or web address] ([email or web address]).  Listed under [place].

## 2018-12-02 NOTE — Progress Notes (Signed)
Progress Note  Patient Name: Kelsey [name] Date of Encounter: 12/02/2018  Primary Cardiologist: Mahala Menghini, MD   Subjective   HR has been around 100-110 overnight on IV Amio  Inpatient Medications    Scheduled Meds: . docusate sodium  100 mg Oral BID  . hydrocortisone sod succinate (SOLU-CORTEF) inj  50 mg Intravenous Q6H  . latanoprost  1 drop Both Eyes QHS  . mupirocin ointment   Topical BID  . sodium chloride flush  3 mL Intravenous Q12H   Continuous Infusions: . amiodarone 30 mg/hr (12/02/18 0139)  . ceFEPime (MAXIPIME) IV 2 g (12/02/18 0924)  . heparin 750 Units/hr (12/01/18 1926)  . vancomycin     PRN Meds: acetaminophen **OR** acetaminophen, HYDROcodone-acetaminophen, ondansetron **OR** ondansetron (ZOFRAN) IV, polyethylene glycol, polyvinyl alcohol   Vital Signs    Vitals:   12/02/18 0400 12/02/18 0500 12/02/18 0600 12/02/18 0800  BP: 116/87 121/87 129/80 (!) 137/104  Pulse: (!) 114 (!) 109 (!) 110 (!) 126  Resp:    15  Temp:    97.6 F (36.4 C)  TempSrc:    Oral  SpO2: 93% 96% 96% 97%  Weight:      Height:        Intake/Output Summary (Last 24 hours) at 12/02/2018 1050 Last data filed at 12/02/2018 0924 Gross per 24 hour  Intake 878.22 ml  Output 400 ml  Net 478.22 ml   Last 3 Weights 12/01/2018 08/13/2017 08/07/2017  Weight (lbs) 160 lb 166 lb 2 oz 166 lb 2 oz  Weight (kg) 72.576 kg 75.354 kg 75.354 kg      Telemetry    AFib - Personally Reviewed  ECG    Physical Exam    Cardiac: tachycardia, irregular Respiratory:  still on oxygen   Labs    Chemistry Recent Labs  Lab 12/01/18 0317 12/01/18 0509 12/02/18 0407  NA 138 137 139  K 5.3* 4.7 4.0  CL 102  --  108  CO2 22  --  22  GLUCOSE 130*  --  134*  BUN 33*  --  28*  CREATININE 1.46*  --  0.91  CALCIUM 8.5*  --  8.2*  GFRNONAA 33*  --  58*  GFRAA 38*  --  >60  ANIONGAP 14  --  9     Hematology Recent Labs  Lab 12/01/18 0317 12/01/18 0509 12/02/18 0407   WBC 15.7*  --  12.6*  RBC 4.28  --  3.75*  HGB 14.9 11.9* 12.8  HCT 44.5 35.0* 38.9  MCV 104.0*  --  103.7*  MCH 34.8*  --  34.1*  MCHC 33.5  --  32.9  RDW 14.2  --  14.2  PLT 206  --  158    Cardiac Enzymes Recent Labs  Lab 12/01/18 0317 12/01/18 0711 12/01/18 1245 12/01/18 1837  TROPONINI 0.54* 0.43* 0.32* 0.18*   No results for input(s): TROPIPOC in the last 168 hours.   BNP Recent Labs  Lab 12/01/18 0317  BNP 1,554.3*     DDimer No results for input(s): DDIMER in the last 168 hours.   Radiology    Dg Chest Port 1 View  Result Date: 12/01/2018 CLINICAL DATA:  85 year old female with shortness of breath. EXAM: PORTABLE CHEST 1 VIEW COMPARISON:  Chest radiograph dated 05/27/2018 FINDINGS: There is diffuse interstitial coarsening and chronic bronchitic changes. No focal consolidation, pleural effusion, or pneumothorax. Stable moderate cardiomegaly. Atherosclerotic calcification of the aortic arch. No acute osseous pathology. Bilateral breast implants. IMPRESSION:  1. No acute cardiopulmonary process. 2. Stable cardiomegaly. Electronically Signed   By: Anner Crete M.D.   On: 12/01/2018 03:38    Cardiac Studies   Prior echo showed normal LV function.  Patient Profile     85 y.o. female with a PMH of HTN, HLD, pulmonary HTN, atrial fibrillation not on anticoagulation, vocal cord paralysis, and chronic prednisone use for unknown reason, who was seen for the evaluation of atrial fibrillation with RVR at the request of Dr. [name].  Assessment & Plan    1. Shock (sepsis vs cardiogenic): overall picture seems more consistent with sepsis. She has been IVF resuscitated and blood pressures have improved.   - Will follow-up echo results to assess for LV dysfunction or wall motion abnormalities to suggest ACS - Continue antibiotic per primary team  2. Persistent atrial fibrillation: failed prior DCCV. Intolerant to amiodarone with little response in HR and patient developed  a tremor. Also with reported swelling with diltiazem. Patient self d/c'd xarelto several months ago due to "blotches on hands".  -- remains in IV amiodarone gtt and heparin. Blood pressures have improved. Can use IV BB PRN for elevated rate. Suspect HR will improve as infection clears.   3. Elevated troponin: trop trend 0.54>0.43>>0.32>>0.18. EKG without ischemic changes. She presented with Afib RVR (rates initially up to 200s on EMS arrival, improved to 120s) and sepsis (hypotensive, febrile, and tachypneic). Felt to be demand ischemia in the setting of sepsis and afib RVR.  - Will follow-up echo results  4. HTN: blood pressures stable today.  5. Chronic diastolic HF: need to follow volume status closely. Net+ 2.8 from admission. Diuresis PRN   For questions or updates, please contact [place] Please consult [email or web address] for contact info under        Signed, Larae Grooms, MD  12/02/2018, 10:50 AM    I have examined the patient and reviewed assessment and plan and discussed with patient.  Agree with above as stated.    Based on prior echo, I doubt this is cardiogenic shock.  She has some diastolic heart failure from the atrial fibrillation.    Rate control options were limited due to borderline BP.  IV AMio and IV digoxin would be two options that would not affect BP.  Troponin trend is flat and more consistent with demand ischemia.    Since BP is better this AM , would favor giving a dose of IV Lasix today, 40 mg.    D/w Dr. Marylyn Ishihara this AM.  Larae Grooms

## 2018-12-02 NOTE — Progress Notes (Signed)
ANTICOAGULATION CONSULT NOTE - Follow Up Consult  Pharmacy Consult for heparin Indication: atrial fibrillation  Labs: Recent Labs    12/01/18 0317 12/01/18 0509 12/01/18 0711 12/01/18 1245 12/01/18 1837 12/02/18 0407  HGB 14.9 11.9*  --   --   --  12.8  HCT 44.5 35.0*  --   --   --  38.9  PLT 206  --   --   --   --  158  HEPARINUNFRC  --   --   --   --  0.96* 0.49  CREATININE 1.46*  --   --   --   --  0.91  TROPONINI 0.54*  --  0.43* 0.32* 0.18*  --     Assessment/Plan:  85yo female therapeutic on heparin after rate change. Will continue gtt at current rate and confirm stable with additional level.   Wynona Neat, PharmD, BCPS  12/02/2018,5:16 AM

## 2018-12-02 NOTE — Progress Notes (Signed)
ANTICOAGULATION CONSULT NOTE - Initial Consult  Pharmacy Consult for Lovenox Indication: atrial fibrillation  Allergies  Allergen Reactions  . Cardizem [Diltiazem Hcl] Swelling  . Clonidine Derivatives Swelling  . Doxazosin Swelling    Patient Measurements: Height: 5\' 8"  (172.7 cm) Weight: 160 lb (72.6 kg) IBW/kg (Calculated) : 63.9  Vital Signs: Temp: 97.6 F (36.4 C) (05/14 1631) Temp Source: Oral (05/14 1631) BP: 127/88 (05/14 1631) Pulse Rate: 59 (05/14 1631)  Labs: Recent Labs    12/01/18 0317 12/01/18 0509 12/01/18 0711 12/01/18 1245 12/01/18 1837 12/02/18 0407 12/02/18 1228  HGB 14.9 11.9*  --   --   --  12.8  --   HCT 44.5 35.0*  --   --   --  38.9  --   PLT 206  --   --   --   --  158  --   HEPARINUNFRC  --   --   --   --  0.96* 0.49 <0.10*  CREATININE 1.46*  --   --   --   --  0.91  --   TROPONINI 0.54*  --  0.43* 0.32* 0.18*  --   --     Estimated Creatinine Clearance: 46.4 mL/min (by C-G formula based on SCr of 0.91 mg/dL).   Medical History: Past Medical History:  Diagnosis Date  . Arthritis    DJD  . Cancer ([place])    basal cell CA  . Chronic anticoagulation    on Xarelto  . Chronic atrial fibrillation    s/p failed DCCV now on rate control  . Diastolic dysfunction    by echo 07/2012  . Glaucoma   . Hyperlipidemia   . Hypertension   . Pulmonary HTN (HCC)    mild - RVSP 40-56mmHg  . Vocal cord paralysis     Medications:  Scheduled:  . docusate sodium  100 mg Oral BID  . hydrocortisone sod succinate (SOLU-CORTEF) inj  50 mg Intravenous Q6H  . latanoprost  1 drop Both Eyes QHS  . mupirocin ointment   Topical BID  . sodium chloride flush  3 mL Intravenous Q12H   Infusions:  . amiodarone 30 mg/hr (12/02/18 1600)  . ceFEPime (MAXIPIME) IV 2 g (12/02/18 0924)  . heparin Stopped (12/02/18 1129)  . vancomycin Stopped (12/02/18 1129)    Assessment: 85 yo F on heparin for afib with RVR.  Pt lost IV access ~1130 and was unable to obtain  additional IV access or PICC.  Pharmacy asked to start Lovenox for treatment dose anticoagulation.  Goal of Therapy:  Anti-Xa level 0.6-1 units/ml 4hrs after LMWH dose given Monitor platelets by anticoagulation protocol: Yes   Plan:  Lovenox 70mg  SQ q12h Monitor CBC, sx of bleeding D/C heparin labs Follow-up plans for oral anticoagulation  Almeda Ezra, Pharm.D., BCPS Clinical Pharmacist  **Pharmacist phone directory can now be found on [email or web address] ([email or web address]).  Listed under [place].  12/02/2018 7:17 PM

## 2018-12-02 NOTE — Progress Notes (Signed)
Consult received (2nd assess) for PIV for IV medications. Upon assessing patient's vasculature bilateral arms with [place]. Spoke with Cristal Deer and recommended that he follow up with MD requesting order for PICC line assessment/placement. Patient is currently on Vancomycin, Amiodarone, and Heparin. VU. Fran Lowes, RN VAST

## 2018-12-02 NOTE — Progress Notes (Signed)
[facility]  PROGRESS NOTE    Cherrise Occhipinti Robinette-Hagie  EGB:[id number] DOB: 1932/07/16 DOA: 12/01/2018 PCP: [name], MD   Brief Narrative:   Kelsey [name] is a 85 y.o. female with medical history significant of pulmonary HTN; HTN; HLD; vocal cord paralysis;  Diastolic dysfunction; and afib previously on Xarelto presenting with myalgias.  I spoke with her husband who reports that she woke up from a nap, she just didn't feel good and she kept getting worse. She wasn't able to move.  She was somewhat SOB.  No cough or respiratory.  No urinary complaints.  No n/v/d.  She was a little subjectively warm.  She has been off Xarelto "for a pretty good while"; she had "blood splotches on her hands" that they attributed to the Xarelto and so she stopped taking it - he thinks this was months ago.  The patient describes generalized myalgias preventing her from getting out of bed.  She has spinal stenosis and is normally sedentary but is able to move with a walker.   Assessment & Plan:   Principal Problem:   Severe sepsis ([place]) Active Problems:   Hypertension   Diastolic dysfunction   Chronic atrial fibrillation with RVR   Cardiogenic shock (HCC)   Shock, septic     - Patient presenting from home with sepsis physiology as well as apparent CHF with refractory afib     - Cardiology, PCCM appreciate assistance     Sepsis     - SIRS criteria in this patient includes: Leukocytosis, fever, tachycardia, tachypnea, hypoxia      - Patient has evidence of acute organ failure with elevated lactate and hypotension     - COVID negative     - influenza A/B, resp viral panel negative     - MRSA nasal swab negative     - Bld Cx pending     - Sepsis protocol initiated     - Patient had initial lactate >4 or SBP <90/MAP <65 and so has received the 30 cc/kg IVF bolus.     - CXR was read as normal, although there are diffuse opacities that could be related to fluid overload or underlying infiltrates, or  could be chronic changes.       - Her UA is unremarkable.       - continue IV Cefepime/Vanc for undifferentiated sepsis  Afib with RVR with cardiogenic shock     - Patient with refractory afib, continued RVR in 120 range     - initially thought to be exacerbated by hypovolemia; she has been fluid recussitated     - Her CXR was mildly concerning for volume overload and her BNP is markedly elevated     - Cardiology recs continuing amiodarone and giving OT lasix 40IV     - echo pending     -she stopped taking her Xarelto due to "blotches" on her hands; she is also a fall risk and she chronic AC may not be her best option; For now, will start on Heparin drip and closely monitor and the long-term AC question can be answered once she is more stable  Chronic diastolic dysfunction     - Echo in 11/19 with preserved EF, normal diastolic function, and mild pulmonary HTN     - echo pending  HTN     - spoke with cards; lasix 40 IV x 1     - BPs ok  Sepsis with source unknown at this point. She says  she had similar presentation at [facility] last November. Will request records. She's down to 2L [place] and feeling fine. No repeat COVID testing needed right now. Continue as above  DVT prophylaxis: heparin Code Status: DNR Family Communication: None at bedside   Disposition Plan: TBD   Consultants:   Cardiology  Antimicrobials:   Vanc. cefepime    Subjective: "I'm ok today. I feel better"  Objective: Vitals:   12/02/18 0500 12/02/18 0600 12/02/18 0800 12/02/18 1631  BP: 121/87 129/80 (!) 137/104 127/88  Pulse: (!) 109 (!) 110 (!) 126 (!) 59  Resp:   15 16  Temp:   97.6 F (36.4 C) 97.6 F (36.4 C)  TempSrc:   Oral Oral  SpO2: 96% 96% 97%   Weight:      Height:        Intake/Output Summary (Last 24 hours) at 12/02/2018 1643 Last data filed at 12/02/2018 5397 Gross per 24 hour  Intake 699.11 ml  Output 400 ml  Net 299.11 ml   Filed Weights   12/01/18 0308  Weight: 72.6 kg     Examination:  General: 85 y.o. female resting in bed in NAD Cardiovascular: irregular irregular tach, +S1, S2, equal pulses throughout Respiratory: b/l rhonchi, normal WOB GI: BS+, NDNT, no masses noted, no organomegaly noted MSK: No e/c/c Neuro: A&O x 3, no focal deficits   Data Reviewed: I have personally reviewed following labs and imaging studies.  CBC: Recent Labs  Lab 12/01/18 0317 12/01/18 0509 12/02/18 0407  WBC 15.7*  --  12.6*  NEUTROABS 13.0*  --   --   HGB 14.9 11.9* 12.8  HCT 44.5 35.0* 38.9  MCV 104.0*  --  103.7*  PLT 206  --  673   Basic Metabolic Panel: Recent Labs  Lab 12/01/18 0317 12/01/18 0509 12/02/18 0407  NA 138 137 139  K 5.3* 4.7 4.0  CL 102  --  108  CO2 22  --  22  GLUCOSE 130*  --  134*  BUN 33*  --  28*  CREATININE 1.46*  --  0.91  CALCIUM 8.5*  --  8.2*   GFR: Estimated Creatinine Clearance: 46.4 mL/min (by C-G formula based on SCr of 0.91 mg/dL). Liver Function Tests: No results for input(s): AST, ALT, ALKPHOS, BILITOT, PROT, ALBUMIN in the last 168 hours. No results for input(s): LIPASE, AMYLASE in the last 168 hours. No results for input(s): AMMONIA in the last 168 hours. Coagulation Profile: No results for input(s): INR, PROTIME in the last 168 hours. Cardiac Enzymes: Recent Labs  Lab 12/01/18 0317 12/01/18 0711 12/01/18 1245 12/01/18 1837  TROPONINI 0.54* 0.43* 0.32* 0.18*   BNP (last 3 results) No results for input(s): PROBNP in the last 8760 hours. HbA1C: No results for input(s): HGBA1C in the last 72 hours. CBG: No results for input(s): GLUCAP in the last 168 hours. Lipid Profile: No results for input(s): CHOL, HDL, LDLCALC, TRIG, CHOLHDL, LDLDIRECT in the last 72 hours. Thyroid Function Tests: No results for input(s): TSH, T4TOTAL, FREET4, T3FREE, THYROIDAB in the last 72 hours. Anemia Panel: No results for input(s): VITAMINB12, FOLATE, FERRITIN, TIBC, IRON, RETICCTPCT in the last 72 hours. Sepsis  Labs: Recent Labs  Lab 12/01/18 0404 12/01/18 0711 12/01/18 1245 12/01/18 1837 12/02/18 0407  PROCALCITON  --  6.46 7.32  --  5.78  LATICACIDVEN 4.6* 2.6* 2.4* 2.9*  --     Recent Results (from the past 240 hour(s))  Urine culture     Status: None  Collection Time: 12/01/18  3:31 AM  Result Value Ref Range Status   Specimen Description URINE, RANDOM  Final   Special Requests NONE  Final   Culture   Final    NO GROWTH Performed at [facility], [address] [place], [place] [address]    Report Status 12/02/2018 FINAL  Final  Blood culture (routine x 2)     Status: None (Preliminary result)   Collection Time: 12/01/18  4:00 AM  Result Value Ref Range Status   Specimen Description BLOOD RIGHT WRIST  Final   Special Requests   Final    BOTTLES DRAWN AEROBIC ONLY Blood Culture adequate volume   Culture   Final    NO GROWTH 1 DAY Performed at [facility], [place] [address], [place], [place] [address]    Report Status PENDING  Incomplete  SARS Coronavirus 2 (CEPHEID- Performed in [place] hospital lab), Hosp Order     Status: None   Collection Time: 12/01/18  4:02 AM  Result Value Ref Range Status   SARS Coronavirus 2 NEGATIVE NEGATIVE Final    Comment: (NOTE) If result is NEGATIVE SARS-CoV-2 target nucleic acids are NOT DETECTED. The SARS-CoV-2 RNA is generally detectable in upper and lower  respiratory specimens during the acute phase of infection. The lowest  concentration of SARS-CoV-2 viral copies this assay can detect is 250  copies / mL. A negative result does not preclude SARS-CoV-2 infection  and should not be used as the sole basis for treatment or other  patient management decisions.  A negative result may occur with  improper specimen collection / handling, submission of specimen other  than nasopharyngeal swab, presence of viral mutation(s) within the  areas targeted by this assay, and inadequate number of viral copies  (<250 copies / mL). A  negative result must be combined with clinical  observations, patient history, and epidemiological information. If result is POSITIVE SARS-CoV-2 target nucleic acids are DETECTED. The SARS-CoV-2 RNA is generally detectable in upper and lower  respiratory specimens dur ing the acute phase of infection.  Positive  results are indicative of active infection with SARS-CoV-2.  Clinical  correlation with patient history and other diagnostic information is  necessary to determine patient infection status.  Positive results do  not rule out bacterial infection or co-infection with other viruses. If result is PRESUMPTIVE POSTIVE SARS-CoV-2 nucleic acids MAY BE PRESENT.   A presumptive positive result was obtained on the submitted specimen  and confirmed on repeat testing.  While 2019 novel coronavirus  (SARS-CoV-2) nucleic acids may be present in the submitted sample  additional confirmatory testing may be necessary for epidemiological  and / or clinical management purposes  to differentiate between  SARS-CoV-2 and other Sarbecovirus currently known to infect humans.  If clinically indicated additional testing with an alternate test  methodology [phone]) is advised. The SARS-CoV-2 RNA is generally  detectable in upper and lower respiratory sp ecimens during the acute  phase of infection. The expected result is Negative. Fact Sheet for Patients:  StrictlyIdeas.no Fact Sheet for Healthcare Providers: BankingDealers.co.za This test is not yet approved or cleared by the [place] FDA and has been authorized for detection and/or diagnosis of SARS-CoV-2 by FDA under an Emergency Use Authorization (EUA).  This EUA will remain in effect (meaning this test can be used) for the duration of the COVID-19 declaration under Section 564(b)(1) of the Act, 21 U.S.C. section 360bbb-3(b)(1), unless the authorization is terminated or revoked  sooner. Performed  at [facility], [place] [address] [place], [place] [address]   Respiratory Panel by PCR     Status: None   Collection Time: 12/01/18  4:02 AM  Result Value Ref Range Status   Adenovirus NOT DETECTED NOT DETECTED Final   Coronavirus 229E NOT DETECTED NOT DETECTED Final    Comment: (NOTE) The Coronavirus on the Respiratory Panel, DOES NOT test for the novel  Coronavirus (2019 nCoV)    Coronavirus HKU1 NOT DETECTED NOT DETECTED Final   Coronavirus NL63 NOT DETECTED NOT DETECTED Final   Coronavirus OC43 NOT DETECTED NOT DETECTED Final   Metapneumovirus NOT DETECTED NOT DETECTED Final   Rhinovirus / Enterovirus NOT DETECTED NOT DETECTED Final   Influenza A NOT DETECTED NOT DETECTED Final   Influenza B NOT DETECTED NOT DETECTED Final   Parainfluenza Virus 1 NOT DETECTED NOT DETECTED Final   Parainfluenza Virus 2 NOT DETECTED NOT DETECTED Final   Parainfluenza Virus 3 NOT DETECTED NOT DETECTED Final   Parainfluenza Virus 4 NOT DETECTED NOT DETECTED Final   Respiratory Syncytial Virus NOT DETECTED NOT DETECTED Final   Bordetella pertussis NOT DETECTED NOT DETECTED Final   Chlamydophila pneumoniae NOT DETECTED NOT DETECTED Final   Mycoplasma pneumoniae NOT DETECTED NOT DETECTED Final    Comment: Performed at [facility], [place]. [address], [place], [place] [address]  Blood culture (routine x 2)     Status: None (Preliminary result)   Collection Time: 12/01/18  4:22 AM  Result Value Ref Range Status   Specimen Description BLOOD LEFT WRIST  Final   Special Requests   Final    BOTTLES DRAWN AEROBIC AND ANAEROBIC Blood Culture adequate volume   Culture   Final    NO GROWTH 1 DAY Performed at [facility], [place] [address] [place], [place] [address]    Report Status PENDING  Incomplete  MRSA PCR Screening     Status: None   Collection Time: 12/01/18  4:43 PM  Result Value Ref Range Status   MRSA by PCR NEGATIVE NEGATIVE Final    Comment:        The GeneXpert MRSA Assay  (FDA approved for NASAL specimens only), is one component of a comprehensive MRSA colonization surveillance program. It is not intended to diagnose MRSA infection nor to guide or monitor treatment for MRSA infections. Performed at [facility], [place] [address], [place], [place] [address]          Radiology Studies: Dg Chest [place] 1 View  Result Date: 12/01/2018 CLINICAL DATA:  85 year old female with shortness of breath. EXAM: PORTABLE CHEST 1 VIEW COMPARISON:  Chest radiograph dated 05/27/2018 FINDINGS: There is diffuse interstitial coarsening and chronic bronchitic changes. No focal consolidation, pleural effusion, or pneumothorax. Stable moderate cardiomegaly. Atherosclerotic calcification of the aortic arch. No acute osseous pathology. Bilateral breast implants. IMPRESSION: 1. No acute cardiopulmonary process. 2. Stable cardiomegaly. Electronically Signed   By: Anner Crete M.D.   On: 12/01/2018 03:38        Scheduled Meds:  docusate sodium  100 mg Oral BID   hydrocortisone sod succinate (SOLU-CORTEF) inj  50 mg Intravenous Q6H   latanoprost  1 drop Both Eyes QHS   mupirocin ointment   Topical BID   sodium chloride flush  3 mL Intravenous Q12H   Continuous Infusions:  amiodarone 30 mg/hr (12/02/18 1600)   ceFEPime (MAXIPIME) IV 2 g (12/02/18 0924)   heparin Stopped (12/02/18 1129)  vancomycin Stopped (12/02/18 1129)     LOS: 1 day    Time spent: 30 minutes spent in the coordination of care today.    Jonnie Finner, DO [facility] Pager [phone]  If 7PM-7AM, please contact night-coverage [email or web address] Password [facility] 12/02/2018, 4:43 PM

## 2018-12-02 NOTE — Progress Notes (Signed)
Pharmacy Antibiotic Note  Kelsey [name] is a 85 y.o. female admitted on 12/01/2018 with SOB/possibel sepsis.  Pharmacy has been consulted for Vancomycin and Cefepime dosing.  Vancomycin 1 g IV given in ED at 0500  Plan: Vancomycin 1 g IV q24h Cefepime 2 g IV q12h  Height: 5\' 8"  (172.7 cm) Weight: 160 lb (72.6 kg) IBW/kg (Calculated) : 63.9  Temp (24hrs), Avg:97.8 F (36.6 C), Min:97.4 F (36.3 C), Max:98.1 F (36.7 C)  Recent Labs  Lab 12/01/18 0317 12/01/18 0404 12/01/18 0711 12/01/18 1245 12/01/18 1837 12/02/18 0407  WBC 15.7*  --   --   --   --  12.6*  CREATININE 1.46*  --   --   --   --  0.91  LATICACIDVEN  --  4.6* 2.6* 2.4* 2.9*  --     Estimated Creatinine Clearance: 46.4 mL/min (by C-G formula based on SCr of 0.91 mg/dL).    Allergies  Allergen Reactions  . Cardizem [Diltiazem Hcl] Swelling  . Clonidine Derivatives Swelling  . Doxazosin Swelling   Antimicrobials this admission: Vanc 5/13 >> Cefepime 5/13 >>  Dose adjustments this admission: 5/14 Vanc 1gm q48 > q24, AUC 484, SCr 0.91 used 1.0 5/14 Cefepime 2gm q24 > q12  Microbiology results: 5/13 covid - negative 5/13 BCx: sent 5/13 UCx: sent 5/13 Resp PCR: neg   Minda Ditto 12/02/2018 8:33 AM

## 2018-12-02 NOTE — Progress Notes (Signed)
  Echocardiogram 2D Echocardiogram has been performed.  Technically difficult study due to patient in atrial fibrillation and breast implants.  Kelsey [name] 12/02/2018, 4:09 PM

## 2018-12-03 ENCOUNTER — Inpatient Hospital Stay (HOSPITAL_COMMUNITY): Payer: Medicare HMO

## 2018-12-03 LAB — RENAL FUNCTION PANEL
Albumin: 2.5 g/dL — ABNORMAL LOW (ref 3.5–5.0)
Anion gap: 7 (ref 5–15)
BUN: 29 mg/dL — ABNORMAL HIGH (ref 8–23)
CO2: 25 mmol/L (ref 22–32)
Calcium: 8.3 mg/dL — ABNORMAL LOW (ref 8.9–10.3)
Chloride: 108 mmol/L (ref 98–111)
Creatinine, Ser: 0.92 mg/dL (ref 0.44–1.00)
GFR calc Af Amer: >60 mL/min (ref >60)
GFR calc non Af Amer: 57 mL/min — ABNORMAL LOW (ref >60)
Glucose, Bld: 82 mg/dL (ref 70–99)
Phosphorus: 3.4 mg/dL (ref 2.5–4.6)
Potassium: 3.4 mmol/L — ABNORMAL LOW (ref 3.5–5.1)
Sodium: 140 mmol/L (ref 135–145)

## 2018-12-03 LAB — CBC WITH DIFFERENTIAL/PLATELET
Abs Immature Granulocytes: 0.16 10*3/uL — ABNORMAL HIGH (ref 0.00–0.07)
Basophils Absolute: 0 10*3/uL (ref 0.0–0.1)
Basophils Relative: 0 %
Eosinophils Absolute: 0.1 10*3/uL (ref 0.0–0.5)
Eosinophils Relative: 1 %
HCT: 35.8 % — ABNORMAL LOW (ref 36.0–46.0)
Hemoglobin: 11.8 g/dL — ABNORMAL LOW (ref 12.0–15.0)
Immature Granulocytes: 2 %
Lymphocytes Relative: 10 %
Lymphs Abs: 1 10*3/uL (ref 0.7–4.0)
MCH: 33.9 pg (ref 26.0–34.0)
MCHC: 33 g/dL (ref 30.0–36.0)
MCV: 102.9 fL — ABNORMAL HIGH (ref 80.0–100.0)
Monocytes Absolute: 0.4 10*3/uL (ref 0.1–1.0)
Monocytes Relative: 4 %
Neutro Abs: 8.3 10*3/uL — ABNORMAL HIGH (ref 1.7–7.7)
Neutrophils Relative %: 83 %
Platelets: 169 10*3/uL (ref 150–400)
RBC: 3.48 MIL/uL — ABNORMAL LOW (ref 3.87–5.11)
RDW: 14.3 % (ref 11.5–15.5)
WBC: 9.9 10*3/uL (ref 4.0–10.5)
nRBC: 0 % (ref 0.0–0.2)

## 2018-12-03 LAB — GLUCOSE, CAPILLARY
Glucose-Capillary: 54 mg/dL — ABNORMAL LOW (ref 70–99)
Glucose-Capillary: 86 mg/dL (ref 70–99)
Glucose-Capillary: 106 mg/dL — ABNORMAL HIGH (ref 70–99)
Glucose-Capillary: 110 mg/dL — ABNORMAL HIGH (ref 70–99)
Glucose-Capillary: 151 mg/dL — ABNORMAL HIGH (ref 70–99)

## 2018-12-03 LAB — LACTIC ACID, PLASMA: Lactic Acid, Venous: 1.2 mmol/L (ref 0.5–1.9)

## 2018-12-03 LAB — MAGNESIUM: Magnesium: 1.9 mg/dL (ref 1.7–2.4)

## 2018-12-03 MED ORDER — FUROSEMIDE 10 MG/ML IJ SOLN
20.0000 mg | Freq: Once | INTRAMUSCULAR | Status: AC
  Administered 2018-12-03: 20 mg via INTRAVENOUS
  Filled 2018-12-03: qty 2

## 2018-12-03 MED ORDER — FENTANYL CITRATE (PF) 100 MCG/2ML IJ SOLN
50.0000 ug | Freq: Once | INTRAMUSCULAR | Status: AC
  Administered 2018-12-03: 12:00:00 50 ug via INTRAVENOUS

## 2018-12-03 MED ORDER — POTASSIUM CHLORIDE CRYS ER 20 MEQ PO TBCR
40.0000 meq | EXTENDED_RELEASE_TABLET | Freq: Once | ORAL | Status: AC
  Administered 2018-12-03: 40 meq via ORAL
  Filled 2018-12-03: qty 2

## 2018-12-03 MED ORDER — DIPHENHYDRAMINE HCL 25 MG PO CAPS
25.0000 mg | ORAL_CAPSULE | Freq: Every evening | ORAL | Status: DC | PRN
  Administered 2018-12-04 – 2018-12-05 (×3): 25 mg via ORAL
  Filled 2018-12-03 (×3): qty 1

## 2018-12-03 MED ORDER — LIDOCAINE HCL 1 % IJ SOLN
INTRAMUSCULAR | Status: DC | PRN
  Administered 2018-12-03: 10 mL

## 2018-12-03 MED ORDER — LIDOCAINE HCL 1 % IJ SOLN
INTRAMUSCULAR | Status: AC
  Filled 2018-12-03: qty 20

## 2018-12-03 MED ORDER — FENTANYL CITRATE (PF) 100 MCG/2ML IJ SOLN
INTRAMUSCULAR | Status: AC
  Administered 2018-12-03: 50 ug via INTRAVENOUS
  Filled 2018-12-03: qty 2

## 2018-12-03 MED ORDER — METOPROLOL TARTRATE 25 MG PO TABS
25.0000 mg | ORAL_TABLET | Freq: Two times a day (BID) | ORAL | Status: DC
  Administered 2018-12-03 – 2018-12-04 (×3): 25 mg via ORAL
  Filled 2018-12-03 (×3): qty 1

## 2018-12-03 NOTE — Progress Notes (Signed)
Patient alert with intermittent confusion, noted with increasing anxiety at start of shift. Re-oriented frequently and educated on ordered PIV infusions.  Patient needs additional PIV access however, poor vascular access with multiple attempts.  Hospitalist notified of patients increasing confusion, orders for Ambien and Ativan received, patient slept for duration of shift.  With AM assess patient noted to accidentally dislodge Right AC PIV.  New PIV initiated to left forearm with one attempt. Patients anxiety improved with rest and she verbalizes that she would like to continue with sleep aid and anxiety medications at HS.  Stable condition at end of shift, will continue to monitor.

## 2018-12-03 NOTE — Procedures (Signed)
Pre procedural Diagnosis: Poor venous access Post Procedural Diagnosis: Same  Successful placement of right basilic vein approach 38 cm dual lumen PICC line with tip at the superior caval-atrial junction.    EBL: None  No immediate post procedural complication.  The PICC line is ready for immediate use.  Ronny Bacon, MD Pager #: [phone]

## 2018-12-03 NOTE — Progress Notes (Signed)
Progress Note  Patient Name: Kelsey [name] Date of Encounter: 12/03/2018  Primary Cardiologist: Mahala Menghini, MD   Subjective   Breathing better  Inpatient Medications    Scheduled Meds: . docusate sodium  100 mg Oral BID  . enoxaparin (LOVENOX) injection  70 mg Subcutaneous Q12H  . hydrocortisone sod succinate (SOLU-CORTEF) inj  50 mg Intravenous Q6H  . latanoprost  1 drop Both Eyes QHS  . mupirocin ointment   Topical BID  . sodium chloride flush  3 mL Intravenous Q12H   Continuous Infusions: . amiodarone 30 mg/hr (12/03/18 0105)  . ceFEPime (MAXIPIME) IV 2 g (12/02/18 2146)  . vancomycin 1,000 mg (12/02/18 2306)   PRN Meds: acetaminophen **OR** acetaminophen, HYDROcodone-acetaminophen, ondansetron **OR** ondansetron (ZOFRAN) IV, polyethylene glycol, polyvinyl alcohol   Vital Signs    Vitals:   12/02/18 1914 12/02/18 2346 12/03/18 0329 12/03/18 0755  BP: 128/84 116/78 117/89 109/67  Pulse: (!) 123 83 95 94  Resp: 20 13 17 17   Temp: 98.3 F (36.8 C) (!) 97.5 F (36.4 C)  98 F (36.7 C)  TempSrc: Oral Oral Oral Oral  SpO2:  97% 100% 92%  Weight:   82.3 kg   Height:        Intake/Output Summary (Last 24 hours) at 12/03/2018 0857 Last data filed at 12/03/2018 0556 Gross per 24 hour  Intake 1044.73 ml  Output 600 ml  Net 444.73 ml   Last 3 Weights 12/03/2018 12/01/2018 08/13/2017  Weight (lbs) 181 lb 7 oz 160 lb 166 lb 2 oz  Weight (kg) 82.3 kg 72.576 kg 75.354 kg      Telemetry    AFib, RVR- Personally Reviewed  ECG      Physical Exam  GEN: No acute distress.   Neck: No JVD Cardiac: irregular, no murmurs, rubs, or gallops.  Respiratory: Mild wheezing to auscultation bilaterally.  Neuro:  Nonfocal  Psych: Normal affect   Labs    Chemistry Recent Labs  Lab 12/01/18 0317 12/01/18 0509 12/02/18 0407 12/03/18 0633  NA 138 137 139 140  K 5.3* 4.7 4.0 3.4*  CL 102  --  108 108  CO2 22  --  22 25  GLUCOSE 130*  --  134* 82  BUN 33*   --  28* 29*  CREATININE 1.46*  --  0.91 0.92  CALCIUM 8.5*  --  8.2* 8.3*  ALBUMIN  --   --   --  2.5*  GFRNONAA 33*  --  58* 57*  GFRAA 38*  --  >60 >60  ANIONGAP 14  --  9 7     Hematology Recent Labs  Lab 12/01/18 0317 12/01/18 0509 12/02/18 0407 12/03/18 0638  WBC 15.7*  --  12.6* 9.9  RBC 4.28  --  3.75* 3.48*  HGB 14.9 11.9* 12.8 11.8*  HCT 44.5 35.0* 38.9 35.8*  MCV 104.0*  --  103.7* 102.9*  MCH 34.8*  --  34.1* 33.9  MCHC 33.5  --  32.9 33.0  RDW 14.2  --  14.2 14.3  PLT 206  --  158 169    Cardiac Enzymes Recent Labs  Lab 12/01/18 0317 12/01/18 0711 12/01/18 1245 12/01/18 1837  TROPONINI 0.54* 0.43* 0.32* 0.18*   No results for input(s): TROPIPOC in the last 168 hours.   BNP Recent Labs  Lab 12/01/18 0317  BNP 1,554.3*     DDimer No results for input(s): DDIMER in the last 168 hours.   Radiology    [place] Ekg  Site Rite  Result Date: 12/02/2018 If Site Rite image not attached, placement could not be confirmed due to current cardiac rhythm.   Cardiac Studies   Pending reading of echo  Patient Profile      85 yr female with a PMH of HTN, HLD, pulmonary HTN, atrial fibrillation not on anticoagulation, vocal cord paralysis, and chronic prednisone use for unknown reason,who was seen for the evaluation ofatrial fibrillation with RVR.  Assessment & Plan    1. Sepsis - Per primary  2. Persistent atrial fibrillation - Failed prior cardioversion. Patient self d/c'd xarelto several months ago due to "blotches on hands". Heparin changed to Lovenox. Remains in IV amiodarone gtt with improved rate.   3.Acute on chronic diastolic CHF - Likely rate related and hydration initially.  Net I & O remain positive. Given IV lasix 40mg  x 1 yesterday.  Dispo: Echo done, pending reading to further guide therapy.        For questions or updates, please contact [place] Please consult [email or web address] for contact info under        Signed, Leanor Kail, PA  12/03/2018, 8:57 AM    I have examined the patient and reviewed assessment and plan and discussed with patient.  Agree with above as stated.   Will add back some metoprolol, lower than home dose, since BP is better.  Stop IV Amio when her HR is better.  She had tremor on PO Amio in the past.  No tremor on the IV Amio.    If BP tolerates, could give another dose of Lasix as well.   Chronically abnormal chest xray.    Larae Grooms

## 2018-12-03 NOTE — Progress Notes (Signed)
MEDICATION-RELATED CONSULT NOTE   IR Procedure Consult - Anticoagulant/Antiplatelet PTA/Inpatient Med List Review by Pharmacist    Procedure: PICC line placed     Completed: 5/15 at 1400  Post-Procedural bleeding risk per IR MD assessment:  Low  Antithrombotic medications on inpatient or PTA profile prior to procedure:   Lovenox 70mg  SQ bid    Recommended restart time per IR Post-Procedure Guidelines:  Day 0, at least 4 hr after procedure   Other considerations:      Plan:    Continue Lovenox 70mg  bid,  Next dose 5/15 at 2000  Minda Ditto PharmD, 12/03/2018, 2:06 PM

## 2018-12-03 NOTE — Progress Notes (Signed)
Marland Kitchen  PROGRESS NOTE    Kelsey [name]  MWU:[id number] DOB: 1932/06/28 DOA: 12/01/2018 PCP: [name], MD   Brief Narrative:   Kelsey Shoemaker Robinette-Hagieis a 85 y.o.femalewith medical history significant ofpulmonary HTN; HTN; HLD; vocal cord paralysis; Diastolic dysfunction; and afib previously on Xarelto presenting with myalgias. I spoke with her husband who reports that she woke up from a nap, she just didn't feel good and she kept getting worse. She wasn't able to move. She was somewhat SOB. No cough or respiratory. No urinary complaints. No n/v/d. She was a little subjectively warm. She has been off Xarelto "for a pretty good while"; she had "blood splotches on her hands" that they attributed to the Xarelto and so she stopped taking it - he thinks this was months ago. The patient describes generalized myalgias preventing her from getting out of bed. She has spinal stenosis and is normally sedentary but is able to move with a walker.   Assessment & Plan:   Principal Problem:   Severe sepsis ([place]) Active Problems:   Hypertension   Diastolic dysfunction   Chronic atrial fibrillation with RVR   Cardiogenic shock (HCC)   Shock, septic     - Patient presenting from home with sepsis physiology as well as apparent CHF with refractory afib     - Cardiology, PCCM appreciate assistance     Sepsis     - SIRS criteria in this patient includes: Leukocytosis, fever, tachycardia, tachypnea, hypoxia      - Patient has evidence of acute organ failure with elevated lactate and hypotension     - COVID negative     - influenza A/B, resp viral panel negative     - MRSA nasal swab negative     - Bld Cx NTD     - Sepsis protocol initiated     - Patient had initial lactate >4 or SBP <90/MAP <65 and so has received the 30 cc/kg IVF bolus.     - CXR was read as normal, although there are diffuse opacities that could be related to fluid overload or underlying infiltrates, or  could be chronic changes.       - Her UA is unremarkable.       - continue IV Cefepime/Vanc for undifferentiated sepsis     - possible de-escalation of abx in morning  Afib with RVR with cardiogenic shock     - Patient with refractory afib, continued RVR in 120 range     - initially thought to be exacerbated by hypovolemia; she has been fluid recussitated     - Her CXR was mildly concerning for volume overload and her BNP is markedly elevated     - Cardiology recs continuing amiodarone and giving OT lasix 40IV     - echo pending     - she stopped taking her Xarelto due to "blotches" on her hands; she is also a fall risk and she chronic AC may not be her best option;     - on lovenox tx-dose     - she is questioning if she can have a watchman device; asked cards about this, not appropriate in her     - HR better today, appreciate cards assistance  Chronic diastolic dysfunction     - Echo in 11/19 with preserved EF, normal diastolic function, and mild pulmonary HTN     - echo pending  HTN     - BPs ok; cards starting metoprolol  CKD3     -  monitor SCr, UOP, watch nephrotoxins   Falls     - reports multiple falls, but says last was in November. Has not followed up on rehab program     - will ask PT to see as she does admit she feels unsteady at times   DVT prophylaxis: Lovenox Code Status: DNR Family Communication: None at bedside   Disposition Plan: TBD   Consultants:   Cardiology  Procedures:   None  Antimicrobials:  . Cefepime, Vanc    Subjective: "I am feeling better." Denies complaints this AM. Says that she stopped her AC d/t blotches. She has had multiple falls. Says she doesn't like physical therapy because it costs too much.  Objective: Vitals:   12/03/18 1149 12/03/18 1210 12/03/18 1240 12/03/18 1331  BP: 125/88 106/88 110/86   Pulse: (!) 112     Resp: 20 20    Temp:    (!) 97.3 F (36.3 C)  TempSrc:    Oral  SpO2:      Weight:      Height:         Intake/Output Summary (Last 24 hours) at 12/03/2018 1554 Last data filed at 12/03/2018 1500 Gross per 24 hour  Intake 1644.73 ml  Output 300 ml  Net 1344.73 ml   Filed Weights   12/01/18 0308 12/03/18 0329  Weight: 72.6 kg 82.3 kg    Examination:  General: 85 y.o. female resting in bed in NAD Cardiovascular: irregular irregular tach, +S1, S2, equal pulses throughout Respiratory: improved, some upper airway transmission, normal WOB GI: BS+, NDNT, no masses noted, no organomegaly noted MSK: No e/c/c Neuro: A&O x 3, no focal deficits    Data Reviewed: I have personally reviewed following labs and imaging studies.  CBC: Recent Labs  Lab 12/01/18 0317 12/01/18 0509 12/02/18 0407 12/03/18 0638  WBC 15.7*  --  12.6* 9.9  NEUTROABS 13.0*  --   --  8.3*  HGB 14.9 11.9* 12.8 11.8*  HCT 44.5 35.0* 38.9 35.8*  MCV 104.0*  --  103.7* 102.9*  PLT 206  --  158 109   Basic Metabolic Panel: Recent Labs  Lab 12/01/18 0317 12/01/18 0509 12/02/18 0407 12/03/18 0633 12/03/18 0638  NA 138 137 139 140  --   K 5.3* 4.7 4.0 3.4*  --   CL 102  --  108 108  --   CO2 22  --  22 25  --   GLUCOSE 130*  --  134* 82  --   BUN 33*  --  28* 29*  --   CREATININE 1.46*  --  0.91 0.92  --   CALCIUM 8.5*  --  8.2* 8.3*  --   MG  --   --   --   --  1.9  PHOS  --   --   --  3.4  --    GFR: Estimated Creatinine Clearance: 51.2 mL/min (by C-G formula based on SCr of 0.92 mg/dL). Liver Function Tests: Recent Labs  Lab 12/03/18 0633  ALBUMIN 2.5*   No results for input(s): LIPASE, AMYLASE in the last 168 hours. No results for input(s): AMMONIA in the last 168 hours. Coagulation Profile: No results for input(s): INR, PROTIME in the last 168 hours. Cardiac Enzymes: Recent Labs  Lab 12/01/18 0317 12/01/18 0711 12/01/18 1245 12/01/18 1837  TROPONINI 0.54* 0.43* 0.32* 0.18*   BNP (last 3 results) No results for input(s): PROBNP in the last 8760 hours. HbA1C: No results for  input(s): HGBA1C  in the last 72 hours. CBG: Recent Labs  Lab 12/03/18 0752 12/03/18 0858 12/03/18 1320  GLUCAP 54* 86 106*   Lipid Profile: No results for input(s): CHOL, HDL, LDLCALC, TRIG, CHOLHDL, LDLDIRECT in the last 72 hours. Thyroid Function Tests: No results for input(s): TSH, T4TOTAL, FREET4, T3FREE, THYROIDAB in the last 72 hours. Anemia Panel: No results for input(s): VITAMINB12, FOLATE, FERRITIN, TIBC, IRON, RETICCTPCT in the last 72 hours. Sepsis Labs: Recent Labs  Lab 12/01/18 0711 12/01/18 1245 12/01/18 1837 12/02/18 0407 12/02/18 1946 12/02/18 2221 12/03/18 0633  PROCALCITON 6.46 7.32  --  5.78  --   --   --   LATICACIDVEN 2.6* 2.4* 2.9*  --  3.9* 2.9* 1.2    Recent Results (from the past 240 hour(s))  Urine culture     Status: None   Collection Time: 12/01/18  3:31 AM  Result Value Ref Range Status   Specimen Description URINE, RANDOM  Final   Special Requests NONE  Final   Culture   Final    NO GROWTH Performed at [facility], [place] [address], [place], [place] [address]    Report Status 12/02/2018 FINAL  Final  Blood culture (routine x 2)     Status: None (Preliminary result)   Collection Time: 12/01/18  4:00 AM  Result Value Ref Range Status   Specimen Description BLOOD RIGHT WRIST  Final   Special Requests   Final    BOTTLES DRAWN AEROBIC ONLY Blood Culture adequate volume   Culture   Final    NO GROWTH 2 DAYS Performed at [facility], [place] [address] [place], [place] [address]    Report Status PENDING  Incomplete  SARS Coronavirus 2 (CEPHEID- Performed in [place] hospital lab), Hosp Order     Status: None   Collection Time: 12/01/18  4:02 AM  Result Value Ref Range Status   SARS Coronavirus 2 NEGATIVE NEGATIVE Final    Comment: (NOTE) If result is NEGATIVE SARS-CoV-2 target nucleic acids are NOT DETECTED. The SARS-CoV-2 RNA is generally detectable in upper and lower  respiratory specimens during the acute phase of  infection. The lowest  concentration of SARS-CoV-2 viral copies this assay can detect is 250  copies / mL. A negative result does not preclude SARS-CoV-2 infection  and should not be used as the sole basis for treatment or other  patient management decisions.  A negative result may occur with  improper specimen collection / handling, submission of specimen other  than nasopharyngeal swab, presence of viral mutation(s) within the  areas targeted by this assay, and inadequate number of viral copies  (<250 copies / mL). A negative result must be combined with clinical  observations, patient history, and epidemiological information. If result is POSITIVE SARS-CoV-2 target nucleic acids are DETECTED. The SARS-CoV-2 RNA is generally detectable in upper and lower  respiratory specimens dur ing the acute phase of infection.  Positive  results are indicative of active infection with SARS-CoV-2.  Clinical  correlation with patient history and other diagnostic information is  necessary to determine patient infection status.  Positive results do  not rule out bacterial infection or co-infection with other viruses. If result is PRESUMPTIVE POSTIVE SARS-CoV-2 nucleic acids MAY BE PRESENT.   A presumptive positive result was obtained on the submitted specimen  and confirmed on repeat testing.  While 2019 novel coronavirus  (SARS-CoV-2) nucleic acids may be present in the submitted sample  additional confirmatory testing may be necessary for  epidemiological  and / or clinical management purposes  to differentiate between  SARS-CoV-2 and other Sarbecovirus currently known to infect humans.  If clinically indicated additional testing with an alternate test  methodology [phone]) is advised. The SARS-CoV-2 RNA is generally  detectable in upper and lower respiratory sp ecimens during the acute  phase of infection. The expected result is Negative. Fact Sheet for Patients:   StrictlyIdeas.no Fact Sheet for Healthcare Providers: BankingDealers.co.za This test is not yet approved or cleared by the [place] FDA and has been authorized for detection and/or diagnosis of SARS-CoV-2 by FDA under an Emergency Use Authorization (EUA).  This EUA will remain in effect (meaning this test can be used) for the duration of the COVID-19 declaration under Section 564(b)(1) of the Act, 21 U.S.C. section 360bbb-3(b)(1), unless the authorization is terminated or revoked sooner. Performed at [facility], [place] [address] [place], [place] [address]   Respiratory Panel by PCR     Status: None   Collection Time: 12/01/18  4:02 AM  Result Value Ref Range Status   Adenovirus NOT DETECTED NOT DETECTED Final   Coronavirus 229E NOT DETECTED NOT DETECTED Final    Comment: (NOTE) The Coronavirus on the Respiratory Panel, DOES NOT test for the novel  Coronavirus (2019 nCoV)    Coronavirus HKU1 NOT DETECTED NOT DETECTED Final   Coronavirus NL63 NOT DETECTED NOT DETECTED Final   Coronavirus OC43 NOT DETECTED NOT DETECTED Final   Metapneumovirus NOT DETECTED NOT DETECTED Final   Rhinovirus / Enterovirus NOT DETECTED NOT DETECTED Final   Influenza A NOT DETECTED NOT DETECTED Final   Influenza B NOT DETECTED NOT DETECTED Final   Parainfluenza Virus 1 NOT DETECTED NOT DETECTED Final   Parainfluenza Virus 2 NOT DETECTED NOT DETECTED Final   Parainfluenza Virus 3 NOT DETECTED NOT DETECTED Final   Parainfluenza Virus 4 NOT DETECTED NOT DETECTED Final   Respiratory Syncytial Virus NOT DETECTED NOT DETECTED Final   Bordetella pertussis NOT DETECTED NOT DETECTED Final   Chlamydophila pneumoniae NOT DETECTED NOT DETECTED Final   Mycoplasma pneumoniae NOT DETECTED NOT DETECTED Final    Comment: Performed at [facility], [place]. [address], [place], [place] [address]  Blood culture (routine x 2)     Status: None (Preliminary result)    Collection Time: 12/01/18  4:22 AM  Result Value Ref Range Status   Specimen Description BLOOD LEFT WRIST  Final   Special Requests   Final    BOTTLES DRAWN AEROBIC AND ANAEROBIC Blood Culture adequate volume   Culture   Final    NO GROWTH 2 DAYS Performed at [place] [address] [place], [place] [address]    Report Status PENDING  Incomplete  MRSA PCR Screening     Status: None   Collection Time: 12/01/18  4:43 PM  Result Value Ref Range Status   MRSA by PCR NEGATIVE NEGATIVE Final    Comment:        The GeneXpert MRSA Assay (FDA approved for NASAL specimens only), is one component of a comprehensive MRSA colonization surveillance program. It is not intended to diagnose MRSA infection nor to guide or monitor treatment for MRSA infections. Performed at [facility], [place] [address] [place], [place] [address]          Radiology Studies: Irpicc Placement Left >5 Yrs Inc Img Guide  Result Date: 12/03/2018 INDICATION: Poor venous access. In need of durable intravenous access for medication administration  and blood draws while hospitalized. EXAM: ULTRASOUND AND FLUOROSCOPIC GUIDED PICC LINE INSERTION MEDICATIONS: None. CONTRAST:  None FLUOROSCOPY TIME:  18 seconds (0.7 mGy) COMPLICATIONS: None immediate. TECHNIQUE: The procedure, risks, benefits, and alternatives were explained to the patient and informed written consent was obtained. A timeout was performed prior to the initiation of the procedure. The right upper extremity was prepped with chlorhexidine in a sterile fashion, and a sterile drape was applied covering the operative field. Maximum barrier sterile technique with sterile gowns and gloves were used for the procedure. A timeout was performed prior to the initiation of the procedure. Local anesthesia was provided with 1% lidocaine. Under direct ultrasound guidance, the basilic vein was accessed with a micropuncture kit after the overlying soft tissues  were anesthetized with 1% lidocaine. After the overlying soft tissues were anesthetized, a small venotomy incision was created and a micropuncture kit was utilized to access the right basilic vein. Real-time ultrasound guidance was utilized for vascular access including the acquisition of a permanent ultrasound image documenting patency of the accessed vessel. A guidewire was advanced to the level of the superior caval-atrial junction for measurement purposes and the PICC line was cut to length. A peel-away sheath was placed and a 38 cm, 5 [place], dual lumen was inserted to level of the superior caval-atrial junction. A post procedure spot fluoroscopic was obtained. The catheter easily aspirated and flushed and was sutured in place. A dressing was placed. The patient tolerated the procedure well without immediate post procedural complication. FINDINGS: After catheter placement, the tip lies within the superior cavoatrial junction. The catheter aspirates and flushes normally and is ready for immediate use. IMPRESSION: Successful ultrasound and fluoroscopic guided placement of a right basilic vein approach, 38 cm, 5 [place], dual lumen PICC with tip at the superior caval-atrial junction. The PICC line is ready for immediate use. Electronically Signed   By: Sandi Mariscal M.D.   On: 12/03/2018 13:45   [place] Ekg Site Rite  Result Date: 12/02/2018 If Site Rite image not attached, placement could not be confirmed due to current cardiac rhythm.       Scheduled Meds: . docusate sodium  100 mg Oral BID  . enoxaparin (LOVENOX) injection  70 mg Subcutaneous Q12H  . hydrocortisone sod succinate (SOLU-CORTEF) inj  50 mg Intravenous Q6H  . latanoprost  1 drop Both Eyes QHS  . lidocaine      . metoprolol tartrate  25 mg Oral BID  . mupirocin ointment   Topical BID  . sodium chloride flush  3 mL Intravenous Q12H   Continuous Infusions: . amiodarone 30 mg/hr (12/03/18 1015)  . ceFEPime (MAXIPIME) IV 2 g (12/03/18  0914)  . vancomycin 1,000 mg (12/02/18 2306)     LOS: 2 days    Time spent: 35 minutes spent in the coordination of care today.    Jonnie Finner, DO [facility] Pager [phone]  If 7PM-7AM, please contact night-coverage [email or web address] Password [facility] 12/03/2018, 3:54 PM

## 2018-12-04 DIAGNOSIS — R778 Other specified abnormalities of plasma proteins: Secondary | ICD-10-CM

## 2018-12-04 DIAGNOSIS — I5043 Acute on chronic combined systolic (congestive) and diastolic (congestive) heart failure: Secondary | ICD-10-CM

## 2018-12-04 DIAGNOSIS — R7989 Other specified abnormal findings of blood chemistry: Secondary | ICD-10-CM

## 2018-12-04 DIAGNOSIS — I4891 Unspecified atrial fibrillation: Secondary | ICD-10-CM

## 2018-12-04 LAB — CBC
HCT: 36.2 % (ref 36.0–46.0)
Hemoglobin: 11.7 g/dL — ABNORMAL LOW (ref 12.0–15.0)
MCH: 33.9 pg (ref 26.0–34.0)
MCHC: 32.3 g/dL (ref 30.0–36.0)
MCV: 104.9 fL — ABNORMAL HIGH (ref 80.0–100.0)
Platelets: 163 10*3/uL (ref 150–400)
RBC: 3.45 MIL/uL — ABNORMAL LOW (ref 3.87–5.11)
RDW: 14.3 % (ref 11.5–15.5)
WBC: 8.8 10*3/uL (ref 4.0–10.5)
nRBC: 0 % (ref 0.0–0.2)

## 2018-12-04 LAB — RENAL FUNCTION PANEL
Albumin: 2.7 g/dL — ABNORMAL LOW (ref 3.5–5.0)
Anion gap: 12 (ref 5–15)
BUN: 27 mg/dL — ABNORMAL HIGH (ref 8–23)
CO2: 24 mmol/L (ref 22–32)
Calcium: 8.8 mg/dL — ABNORMAL LOW (ref 8.9–10.3)
Chloride: 103 mmol/L (ref 98–111)
Creatinine, Ser: 0.89 mg/dL (ref 0.44–1.00)
GFR calc Af Amer: >60 mL/min (ref >60)
GFR calc non Af Amer: 60 mL/min — ABNORMAL LOW (ref >60)
Glucose, Bld: 125 mg/dL — ABNORMAL HIGH (ref 70–99)
Phosphorus: 3.1 mg/dL (ref 2.5–4.6)
Potassium: 4 mmol/L (ref 3.5–5.1)
Sodium: 139 mmol/L (ref 135–145)

## 2018-12-04 LAB — GLUCOSE, CAPILLARY
Glucose-Capillary: 115 mg/dL — ABNORMAL HIGH (ref 70–99)
Glucose-Capillary: 150 mg/dL — ABNORMAL HIGH (ref 70–99)
Glucose-Capillary: 112 mg/dL — ABNORMAL HIGH (ref 70–99)
Glucose-Capillary: 156 mg/dL — ABNORMAL HIGH (ref 70–99)

## 2018-12-04 LAB — ECHOCARDIOGRAM LIMITED
Height: 68 in
Weight: 2560 oz

## 2018-12-04 LAB — MAGNESIUM: Magnesium: 2 mg/dL (ref 1.7–2.4)

## 2018-12-04 LAB — VITAMIN B12: Vitamin B-12: 320 pg/mL (ref 180–914)

## 2018-12-04 MED ORDER — AMIODARONE HCL 200 MG PO TABS
400.0000 mg | ORAL_TABLET | Freq: Every day | ORAL | Status: DC
  Administered 2018-12-05 – 2018-12-07 (×3): 400 mg via ORAL
  Filled 2018-12-04 (×3): qty 2

## 2018-12-04 MED ORDER — LEVOFLOXACIN 750 MG PO TABS
750.0000 mg | ORAL_TABLET | Freq: Every day | ORAL | Status: DC
  Administered 2018-12-04 – 2018-12-06 (×3): 750 mg via ORAL
  Filled 2018-12-04 (×3): qty 1

## 2018-12-04 MED ORDER — CARVEDILOL 6.25 MG PO TABS
6.2500 mg | ORAL_TABLET | Freq: Two times a day (BID) | ORAL | Status: DC
  Administered 2018-12-04 – 2018-12-06 (×4): 6.25 mg via ORAL
  Filled 2018-12-04 (×4): qty 1

## 2018-12-04 MED ORDER — FUROSEMIDE 10 MG/ML IJ SOLN
40.0000 mg | Freq: Two times a day (BID) | INTRAMUSCULAR | Status: AC
  Administered 2018-12-04 – 2018-12-05 (×3): 40 mg via INTRAVENOUS
  Filled 2018-12-04 (×3): qty 4

## 2018-12-04 NOTE — Progress Notes (Signed)
[facility]  PROGRESS NOTE    Kelsey [name]  JHE:[id number] DOB: 06-15-1932 DOA: 12/01/2018 PCP: [name], MD   Brief Narrative:   Kelsey Schlossberg Robinette-Hagieis a 85 y.o.femalewith medical history significant ofpulmonary HTN; HTN; HLD; vocal cord paralysis; Diastolic dysfunction; and afib previously on Xarelto presenting with myalgias. I spoke with her husband who reports that she woke up from a nap, she just didn't feel good and she kept getting worse. She wasn't able to move. She was somewhat SOB. No cough or respiratory. No urinary complaints. No n/v/d. She was a little subjectively warm. She has been off Xarelto "for a pretty good while"; she had "blood splotches on her hands" that they attributed to the Xarelto and so she stopped taking it - he thinks this was months ago. The patient describes generalized myalgias preventing her from getting out of bed. She has spinal stenosis and is normally sedentary but is able to move with a walker.   Assessment & Plan:   Principal Problem:   Severe sepsis ([place]) Active Problems:   Hypertension   Diastolic dysfunction   Chronic atrial fibrillation with RVR   Cardiogenic shock (HCC)   Shock, septic - Patient presenting from home with sepsis physiology as well as apparent CHF with refractory afib - Cardiology appreciate assistance  Sepsis - SIRS criteria in this patient includes: Leukocytosis, fever, tachycardia, tachypnea, hypoxia  - Patient has evidence of acute organ failure with elevated lactate and hypotension - COVID negative - influenza A/B, resp viral panel negative - MRSA nasal swab negative - Bld Cx NTD - Sepsis protocol initiated - Patient had initial lactate >4 or SBP <90/MAP <65 and so has received the 30 cc/kg IVF bolus. - CXR was read as normal, although there are diffuse opacities that could be related to fluid overload or underlying infiltrates, or could be  chronic changes.  - Her UA is unremarkable.  - continue IV Cefepime/Vanc for undifferentiated sepsis     - transition to lvq to complete 7-days Tx? Still with no source, but improvement on abx; at very least can stop vanc today.  Afib with RVR with cardiogenic shock - Patient with refractory afib at admission - initially thought to be exacerbated by hypovolemia; she has been fluid recussitated - Her CXR was mildly concerning for volume overload and her BNP is markedly elevated - currently on IV amio and Po metoprolol - echo pending - she stopped taking her Xarelto due to "blotches" on her hands; she is also a fall risk and she chronic AC may not be her best option; have talked to her about this and she is aware of the risks     - on lovenox tx-dose     - she is questioning if she can have a watchman device; asked cards about this, not appropriate in her     - Now on metoprolol 59m BID per cards; appreciate assistance  Chronic diastolic dysfunction - Echo in 11/19 with preserved EF, normal diastolic function, and mild pulmonary HTN - echo results pending  HTN - BPs ok     - metoprolol 25 BID  CKD3     - monitor SCr, UOP, watch nephrotoxins   Falls     - reports multiple falls, but says last was in November. Has not followed up on rehab program     - PT consulted as she does admit she feels unsteady at times   DVT prophylaxis: lovenox Code Status: DNR Family Communication: None at  bedside   Disposition Plan: TBD   Consultants:   Cardiology  Procedures:   None  Antimicrobials:  . Cefepime    Subjective: "I like what you are saying. I feel good."  Objective: Vitals:   12/03/18 2300 12/04/18 0300 12/04/18 0448 12/04/18 0500  BP:  129/88 (!) 135/94   Pulse: (!) 107 85    Resp: 20 18    Temp: 97.9 F (36.6 C) 97.9 F (36.6 C)    TempSrc: Oral Oral    SpO2: 97% 97%    Weight:    84.8 kg  Height:         Intake/Output Summary (Last 24 hours) at 12/04/2018 0708 Last data filed at 12/04/2018 0400 Gross per 24 hour  Intake 1421.17 ml  Output 101 ml  Net 1320.17 ml   Filed Weights   12/01/18 0308 12/03/18 0329 12/04/18 0500  Weight: 72.6 kg 82.3 kg 84.8 kg    Examination:  General: 85 y.o. female resting in bed in NAD Cardiovascular: irregular irregular, +S1, S2, equal pulses throughout Respiratory: CTABL, normal WOB GI: BS+, NDNT, no masses noted, no organomegaly noted MSK: No e/c/c Neuro: A&O x 3, no focal deficits   Data Reviewed: I have personally reviewed following labs and imaging studies.  CBC: Recent Labs  Lab 12/01/18 0317 12/01/18 0509 12/02/18 0407 12/03/18 0638 12/04/18 0500  WBC 15.7*  --  12.6* 9.9 8.8  NEUTROABS 13.0*  --   --  8.3*  --   HGB 14.9 11.9* 12.8 11.8* 11.7*  HCT 44.5 35.0* 38.9 35.8* 36.2  MCV 104.0*  --  103.7* 102.9* 104.9*  PLT 206  --  158 169 009   Basic Metabolic Panel: Recent Labs  Lab 12/01/18 0317 12/01/18 0509 12/02/18 0407 12/03/18 0633 12/03/18 0638  NA 138 137 139 140  --   K 5.3* 4.7 4.0 3.4*  --   CL 102  --  108 108  --   CO2 22  --  22 25  --   GLUCOSE 130*  --  134* 82  --   BUN 33*  --  28* 29*  --   CREATININE 1.46*  --  0.91 0.92  --   CALCIUM 8.5*  --  8.2* 8.3*  --   MG  --   --   --   --  1.9  PHOS  --   --   --  3.4  --    GFR: Estimated Creatinine Clearance: 52 mL/min (by C-G formula based on SCr of 0.92 mg/dL). Liver Function Tests: Recent Labs  Lab 12/03/18 0633  ALBUMIN 2.5*   No results for input(s): LIPASE, AMYLASE in the last 168 hours. No results for input(s): AMMONIA in the last 168 hours. Coagulation Profile: No results for input(s): INR, PROTIME in the last 168 hours. Cardiac Enzymes: Recent Labs  Lab 12/01/18 0317 12/01/18 0711 12/01/18 1245 12/01/18 1837  TROPONINI 0.54* 0.43* 0.32* 0.18*   BNP (last 3 results) No results for input(s): PROBNP in the last 8760 hours. HbA1C: No  results for input(s): HGBA1C in the last 72 hours. CBG: Recent Labs  Lab 12/03/18 0752 12/03/18 0858 12/03/18 1320 12/03/18 1703 12/03/18 2119  GLUCAP 54* 86 106* 110* 151*   Lipid Profile: No results for input(s): CHOL, HDL, LDLCALC, TRIG, CHOLHDL, LDLDIRECT in the last 72 hours. Thyroid Function Tests: No results for input(s): TSH, T4TOTAL, FREET4, T3FREE, THYROIDAB in the last 72 hours. Anemia Panel: No results for input(s): VITAMINB12, FOLATE, FERRITIN,  TIBC, IRON, RETICCTPCT in the last 72 hours. Sepsis Labs: Recent Labs  Lab 12/01/18 0711 12/01/18 1245 12/01/18 1837 12/02/18 0407 12/02/18 1946 12/02/18 2221 12/03/18 0633  PROCALCITON 6.46 7.32  --  5.78  --   --   --   LATICACIDVEN 2.6* 2.4* 2.9*  --  3.9* 2.9* 1.2    Recent Results (from the past 240 hour(s))  Urine culture     Status: None   Collection Time: 12/01/18  3:31 AM  Result Value Ref Range Status   Specimen Description URINE, RANDOM  Final   Special Requests NONE  Final   Culture   Final    NO GROWTH Performed at [facility], [place] [address] [place], [place] [address]    Report Status 12/02/2018 FINAL  Final  Blood culture (routine x 2)     Status: None (Preliminary result)   Collection Time: 12/01/18  4:00 AM  Result Value Ref Range Status   Specimen Description BLOOD RIGHT WRIST  Final   Special Requests   Final    BOTTLES DRAWN AEROBIC ONLY Blood Culture adequate volume   Culture   Final    NO GROWTH 2 DAYS Performed at [facility], [place] [address] [place], [place] [address]    Report Status PENDING  Incomplete  SARS Coronavirus 2 (CEPHEID- Performed in [place] hospital lab), Hosp Order     Status: None   Collection Time: 12/01/18  4:02 AM  Result Value Ref Range Status   SARS Coronavirus 2 NEGATIVE NEGATIVE Final    Comment: (NOTE) If result is NEGATIVE SARS-CoV-2 target nucleic acids are NOT DETECTED. The SARS-CoV-2 RNA is generally detectable in upper and lower   respiratory specimens during the acute phase of infection. The lowest  concentration of SARS-CoV-2 viral copies this assay can detect is 250  copies / mL. A negative result does not preclude SARS-CoV-2 infection  and should not be used as the sole basis for treatment or other  patient management decisions.  A negative result may occur with  improper specimen collection / handling, submission of specimen other  than nasopharyngeal swab, presence of viral mutation(s) within the  areas targeted by this assay, and inadequate number of viral copies  (<250 copies / mL). A negative result must be combined with clinical  observations, patient history, and epidemiological information. If result is POSITIVE SARS-CoV-2 target nucleic acids are DETECTED. The SARS-CoV-2 RNA is generally detectable in upper and lower  respiratory specimens dur ing the acute phase of infection.  Positive  results are indicative of active infection with SARS-CoV-2.  Clinical  correlation with patient history and other diagnostic information is  necessary to determine patient infection status.  Positive results do  not rule out bacterial infection or co-infection with other viruses. If result is PRESUMPTIVE POSTIVE SARS-CoV-2 nucleic acids MAY BE PRESENT.   A presumptive positive result was obtained on the submitted specimen  and confirmed on repeat testing.  While 2019 novel coronavirus  (SARS-CoV-2) nucleic acids may be present in the submitted sample  additional confirmatory testing may be necessary for epidemiological  and / or clinical management purposes  to differentiate between  SARS-CoV-2 and other Sarbecovirus currently known to infect humans.  If clinically indicated additional testing with an alternate test  methodology [id number]) is advised. The SARS-CoV-2 RNA is generally  detectable in upper and lower respiratory sp ecimens during the acute  phase of infection. The expected result is Negative. Fact  Sheet  for Patients:  StrictlyIdeas.no Fact Sheet for Healthcare Providers: BankingDealers.co.za This test is not yet approved or cleared by the [place] FDA and has been authorized for detection and/or diagnosis of SARS-CoV-2 by FDA under an Emergency Use Authorization (EUA).  This EUA will remain in effect (meaning this test can be used) for the duration of the COVID-19 declaration under Section 564(b)(1) of the Act, 21 U.S.C. section 360bbb-3(b)(1), unless the authorization is terminated or revoked sooner. Performed at [facility], [place] [address] [place], [place] [address]   Respiratory Panel by PCR     Status: None   Collection Time: 12/01/18  4:02 AM  Result Value Ref Range Status   Adenovirus NOT DETECTED NOT DETECTED Final   Coronavirus 229E NOT DETECTED NOT DETECTED Final    Comment: (NOTE) The Coronavirus on the Respiratory Panel, DOES NOT test for the novel  Coronavirus (2019 nCoV)    Coronavirus HKU1 NOT DETECTED NOT DETECTED Final   Coronavirus NL63 NOT DETECTED NOT DETECTED Final   Coronavirus OC43 NOT DETECTED NOT DETECTED Final   Metapneumovirus NOT DETECTED NOT DETECTED Final   Rhinovirus / Enterovirus NOT DETECTED NOT DETECTED Final   Influenza A NOT DETECTED NOT DETECTED Final   Influenza B NOT DETECTED NOT DETECTED Final   Parainfluenza Virus 1 NOT DETECTED NOT DETECTED Final   Parainfluenza Virus 2 NOT DETECTED NOT DETECTED Final   Parainfluenza Virus 3 NOT DETECTED NOT DETECTED Final   Parainfluenza Virus 4 NOT DETECTED NOT DETECTED Final   Respiratory Syncytial Virus NOT DETECTED NOT DETECTED Final   Bordetella pertussis NOT DETECTED NOT DETECTED Final   Chlamydophila pneumoniae NOT DETECTED NOT DETECTED Final   Mycoplasma pneumoniae NOT DETECTED NOT DETECTED Final    Comment: Performed at [facility], [place]. [address], [place], [place] [address]  Blood culture (routine x 2)     Status: None  (Preliminary result)   Collection Time: 12/01/18  4:22 AM  Result Value Ref Range Status   Specimen Description BLOOD LEFT WRIST  Final   Special Requests   Final    BOTTLES DRAWN AEROBIC AND ANAEROBIC Blood Culture adequate volume   Culture   Final    NO GROWTH 2 DAYS Performed at [place] [address], [place], [place] [address]    Report Status PENDING  Incomplete  MRSA PCR Screening     Status: None   Collection Time: 12/01/18  4:43 PM  Result Value Ref Range Status   MRSA by PCR NEGATIVE NEGATIVE Final    Comment:        The GeneXpert MRSA Assay (FDA approved for NASAL specimens only), is one component of a comprehensive MRSA colonization surveillance program. It is not intended to diagnose MRSA infection nor to guide or monitor treatment for MRSA infections. Performed at [facility], [place] [address], [place],  [address]          Radiology Studies: Irpicc Placement Left >5 Yrs Inc Img Guide  Result Date: 12/03/2018 INDICATION: Poor venous access. In need of durable intravenous access for medication administration and blood draws while hospitalized. EXAM: ULTRASOUND AND FLUOROSCOPIC GUIDED PICC LINE INSERTION MEDICATIONS: None. CONTRAST:  None FLUOROSCOPY TIME:  18 seconds (0.7 mGy) COMPLICATIONS: None immediate. TECHNIQUE: The procedure, risks, benefits, and alternatives were explained to the patient and informed written consent was obtained. A timeout was performed prior to the initiation of the procedure. The right upper extremity was prepped with chlorhexidine in  a sterile fashion, and a sterile drape was applied covering the operative field. Maximum barrier sterile technique with sterile gowns and gloves were used for the procedure. A timeout was performed prior to the initiation of the procedure. Local anesthesia was provided with 1% lidocaine. Under direct ultrasound guidance, the basilic vein was accessed with a micropuncture kit after the  overlying soft tissues were anesthetized with 1% lidocaine. After the overlying soft tissues were anesthetized, a small venotomy incision was created and a micropuncture kit was utilized to access the right basilic vein. Real-time ultrasound guidance was utilized for vascular access including the acquisition of a permanent ultrasound image documenting patency of the accessed vessel. A guidewire was advanced to the level of the superior caval-atrial junction for measurement purposes and the PICC line was cut to length. A peel-away sheath was placed and a 38 cm, 5 [place], dual lumen was inserted to level of the superior caval-atrial junction. A post procedure spot fluoroscopic was obtained. The catheter easily aspirated and flushed and was sutured in place. A dressing was placed. The patient tolerated the procedure well without immediate post procedural complication. FINDINGS: After catheter placement, the tip lies within the superior cavoatrial junction. The catheter aspirates and flushes normally and is ready for immediate use. IMPRESSION: Successful ultrasound and fluoroscopic guided placement of a right basilic vein approach, 38 cm, 5 [place], dual lumen PICC with tip at the superior caval-atrial junction. The PICC line is ready for immediate use. Electronically Signed   By: Sandi Mariscal M.D.   On: 12/03/2018 13:45   [place] Ekg Site Rite  Result Date: 12/02/2018 If Site Rite image not attached, placement could not be confirmed due to current cardiac rhythm.       Scheduled Meds: . docusate sodium  100 mg Oral BID  . enoxaparin (LOVENOX) injection  70 mg Subcutaneous Q12H  . hydrocortisone sod succinate (SOLU-CORTEF) inj  50 mg Intravenous Q6H  . latanoprost  1 drop Both Eyes QHS  . metoprolol tartrate  25 mg Oral BID  . mupirocin ointment   Topical BID  . sodium chloride flush  3 mL Intravenous Q12H   Continuous Infusions: . amiodarone 30 mg/hr (12/03/18 2100)  . ceFEPime (MAXIPIME) IV 2 g  (12/03/18 2129)  . vancomycin 1,000 mg (12/03/18 2231)     LOS: 3 days    Time spent: 25 minutes spent in the coordination of care today    Jonnie Finner, DO [facility] Pager [phone]  If 7PM-7AM, please contact night-coverage [email or web address] Password [email or web address] 12/04/2018, 7:08 AM

## 2018-12-04 NOTE — Progress Notes (Signed)
DAILY PROGRESS NOTE   Patient Name: Kelsey [name] Date of Encounter: 12/04/2018 Cardiologist: Mahala Menghini, MD  Chief Complaint   Mild shortness of breath  Patient Profile   85 yr female with a PMH of HTN, HLD, pulmonary HTN, atrial fibrillation not on anticoagulation, vocal cord paralysis, and chronic prednisone use for unknown reason,whowas seen forthe evaluation ofatrial fibrillation with RVR.  Subjective   Echo personally reviewed today - there is new cardiomyopathy with LVEF 25-30%, global hypokinesis and at least moderate RV systolic dysfunction as well as moderate to severe biatrial enlargement. Rates are better controlled on amiodarone and metoprolol. Although she had tremors on amiodarone in the past, she is willing to re-try it as it is likely the best medicine alternative for her.  Objective   Vitals:   12/04/18 0500 12/04/18 0800 12/04/18 0816 12/04/18 1151  BP:   117/89 116/76  Pulse:  98 80 92  Resp:  (!) _0 Temp:   (!) 97.5 F (36.4 C) 97.8 F (36.6 C)  TempSrc:   Oral Oral  SpO2:  98% 97% 97%  Weight: 84.8 kg     Height:        Intake/Output Summary (Last 24 hours) at 12/04/2018 1431 Last data filed at 12/04/2018 1328 Gross per 24 hour  Intake 1639.17 ml  Output 101 ml  Net 1538.17 ml   Filed Weights   12/01/18 0308 12/03/18 0329 12/04/18 0500  Weight: 72.6 kg 82.3 kg 84.8 kg    Physical Exam   General appearance: alert, no distress and moderately obese Neck: JVD - 3 cm above sternal notch, no carotid bruit and thyroid not enlarged, symmetric, no tenderness/mass/nodules Lungs: diminished breath sounds bibasilar Heart: irregularly irregular rhythm Abdomen: soft, non-tender; bowel sounds normal; no masses,  no organomegaly Extremities: edema 1+ ede,a Pulses: 2+ and symmetric Skin: pale, dry, warm Neurologic: Mental status: Alert, oriented, thought content appropriate Psych: Pleasant  Inpatient Medications    Scheduled  Meds:  docusate sodium  100 mg Oral BID   enoxaparin (LOVENOX) injection  70 mg Subcutaneous Q12H   hydrocortisone sod succinate (SOLU-CORTEF) inj  50 mg Intravenous Q6H   latanoprost  1 drop Both Eyes QHS   levofloxacin  750 mg Oral Daily   metoprolol tartrate  25 mg Oral BID   mupirocin ointment   Topical BID   sodium chloride flush  3 mL Intravenous Q12H    Continuous Infusions:  amiodarone 30 mg/hr (12/03/18 2100)    PRN Meds: acetaminophen **OR** acetaminophen, diphenhydrAMINE, HYDROcodone-acetaminophen, lidocaine, ondansetron **OR** ondansetron (ZOFRAN) IV, polyethylene glycol, polyvinyl alcohol   Labs   Results for orders placed or performed during the hospital encounter of 12/01/18 (from the past 48 hour(s))  Lactic acid, plasma     Status: Abnormal   Collection Time: 12/02/18  7:46 PM  Result Value Ref Range   Lactic Acid, Venous 3.9 (HH) 0.5 - 1.9 mmol/L    Comment: CRITICAL RESULT CALLED TO, READ BACK BY AND VERIFIED WITH: D DUNCAN,RN 2009 12/02/2018 D BRADLEY Performed at [facility], [address], [place], [place] [address]   Lactic acid, plasma     Status: Abnormal   Collection Time: 12/02/18 10:21 PM  Result Value Ref Range   Lactic Acid, Venous 2.9 (HH) 0.5 - 1.9 mmol/L    Comment: CRITICAL RESULT CALLED TO, READ BACK BY AND VERIFIED WITH: BAILEY,K RN 12/02/2018 2303 JORDANS Performed at [facility], [place] [address], [place], [place]  [address]   Renal function panel     Status: Abnormal   Collection Time: 12/03/18  6:33 AM  Result Value Ref Range   Sodium 140 135 - 145 mmol/L   Potassium 3.4 (L) 3.5 - 5.1 mmol/L   Chloride 108 98 - 111 mmol/L   CO2 25 22 - 32 mmol/L   Glucose, Bld 82 70 - 99 mg/dL   BUN 29 (H) 8 - 23 mg/dL   Creatinine, Ser 0.92 0.44 - 1.00 mg/dL   Calcium 8.3 (L) 8.9 - 10.3 mg/dL   Phosphorus 3.4 2.5 - 4.6 mg/dL   Albumin 2.5 (L) 3.5 - 5.0 g/dL   GFR calc non Af Amer 57 (L) >60 mL/min   GFR calc Af Amer >60  >60 mL/min   Anion gap 7 5 - 15    Comment: Performed at [facility] [address] [place], [place] [address]  Lactic acid, plasma     Status: None   Collection Time: 12/03/18  6:33 AM  Result Value Ref Range   Lactic Acid, Venous 1.2 0.5 - 1.9 mmol/L    Comment: Performed at [facility] [address] [place], [place] [address]  CBC with Differential/Platelet     Status: Abnormal   Collection Time: 12/03/18  6:38 AM  Result Value Ref Range   WBC 9.9 4.0 - 10.5 K/uL   RBC 3.48 (L) 3.87 - 5.11 MIL/uL   Hemoglobin 11.8 (L) 12.0 - 15.0 g/dL   HCT 35.8 (L) 36.0 - 46.0 %   MCV 102.9 (H) 80.0 - 100.0 fL   MCH 33.9 26.0 - 34.0 pg   MCHC 33.0 30.0 - 36.0 g/dL   RDW 14.3 11.5 - 15.5 %   Platelets 169 150 - 400 K/uL   nRBC 0.0 0.0 - 0.2 %   Neutrophils Relative % 83 %   Neutro Abs 8.3 (H) 1.7 - 7.7 K/uL   Lymphocytes Relative 10 %   Lymphs Abs 1.0 0.7 - 4.0 K/uL   Monocytes Relative 4 %   Monocytes Absolute 0.4 0.1 - 1.0 K/uL   Eosinophils Relative 1 %   Eosinophils Absolute 0.1 0.0 - 0.5 K/uL   Basophils Relative 0 %   Basophils Absolute 0.0 0.0 - 0.1 K/uL   Immature Granulocytes 2 %   Abs Immature Granulocytes 0.16 (H) 0.00 - 0.07 K/uL    Comment: Performed at [facility] [address], [place], [place] [address]  Magnesium     Status: None   Collection Time: 12/03/18  6:38 AM  Result Value Ref Range   Magnesium 1.9 1.7 - 2.4 mg/dL    Comment: Performed at [facility] [address], [place], [place] [address]  Glucose, capillary     Status: Abnormal   Collection Time: 12/03/18  7:52 AM  Result Value Ref Range   Glucose-Capillary 54 (L) 70 - 99 mg/dL  Glucose, capillary     Status: None   Collection Time: 12/03/18  8:58 AM  Result Value Ref Range   Glucose-Capillary 86 70 - 99 mg/dL  Glucose, capillary     Status: Abnormal   Collection Time: 12/03/18  1:20 PM  Result Value Ref Range   Glucose-Capillary 106 (H) 70 - 99 mg/dL  Glucose,  capillary     Status: Abnormal   Collection Time: 12/03/18  5:03 PM  Result Value Ref Range   Glucose-Capillary 110 (H) 70 - 99 mg/dL  Glucose, capillary  Status: Abnormal   Collection Time: 12/03/18  9:19 PM  Result Value Ref Range   Glucose-Capillary 151 (H) 70 - 99 mg/dL  CBC     Status: Abnormal   Collection Time: 12/04/18  5:00 AM  Result Value Ref Range   WBC 8.8 4.0 - 10.5 K/uL   RBC 3.45 (L) 3.87 - 5.11 MIL/uL   Hemoglobin 11.7 (L) 12.0 - 15.0 g/dL   HCT 36.2 36.0 - 46.0 %   MCV 104.9 (H) 80.0 - 100.0 fL   MCH 33.9 26.0 - 34.0 pg   MCHC 32.3 30.0 - 36.0 g/dL   RDW 14.3 11.5 - 15.5 %   Platelets 163 150 - 400 K/uL   nRBC 0.0 0.0 - 0.2 %    Comment: Performed at [facility], [place] [address], [place], [place] [address]  Glucose, capillary     Status: Abnormal   Collection Time: 12/04/18  8:17 AM  Result Value Ref Range   Glucose-Capillary 115 (H) 70 - 99 mg/dL  Renal function panel     Status: Abnormal   Collection Time: 12/04/18  8:44 AM  Result Value Ref Range   Sodium 139 135 - 145 mmol/L   Potassium 4.0 3.5 - 5.1 mmol/L   Chloride 103 98 - 111 mmol/L   CO2 24 22 - 32 mmol/L   Glucose, Bld 125 (H) 70 - 99 mg/dL   BUN 27 (H) 8 - 23 mg/dL   Creatinine, Ser 0.89 0.44 - 1.00 mg/dL   Calcium 8.8 (L) 8.9 - 10.3 mg/dL   Phosphorus 3.1 2.5 - 4.6 mg/dL   Albumin 2.7 (L) 3.5 - 5.0 g/dL   GFR calc non Af Amer 60 (L) >60 mL/min   GFR calc Af Amer >60 >60 mL/min   Anion gap 12 5 - 15    Comment: Performed at [facility] [address], [place], [place] [address]  Magnesium     Status: None   Collection Time: 12/04/18  8:44 AM  Result Value Ref Range   Magnesium 2.0 1.7 - 2.4 mg/dL    Comment: Performed at [facility] [address] [place], [place] [address]  Vitamin B12     Status: None   Collection Time: 12/04/18  8:44 AM  Result Value Ref Range   Vitamin B-12 320 180 - 914 pg/mL    Comment: (NOTE) This assay is not validated for testing  neonatal or myeloproliferative syndrome specimens for Vitamin B12 levels. Performed at [facility], [place] [address] [place], [place] [address]   Glucose, capillary     Status: Abnormal   Collection Time: 12/04/18 11:53 AM  Result Value Ref Range   Glucose-Capillary 150 (H) 70 - 99 mg/dL    ECG   N/A  Telemetry   Afib with CVR - Personally Reviewed  Radiology    Irpicc Placement Left >5 Yrs Inc Img Guide  Result Date: 12/03/2018 INDICATION: Poor venous access. In need of durable intravenous access for medication administration and blood draws while hospitalized. EXAM: ULTRASOUND AND FLUOROSCOPIC GUIDED PICC LINE INSERTION MEDICATIONS: None. CONTRAST:  None FLUOROSCOPY TIME:  18 seconds (0.7 mGy) COMPLICATIONS: None immediate. TECHNIQUE: The procedure, risks, benefits, and alternatives were explained to the patient and informed written consent was obtained. A timeout was performed prior to the initiation of the procedure. The right upper extremity was prepped with chlorhexidine in a sterile fashion, and a sterile drape was applied covering the operative field. Maximum  barrier sterile technique with sterile gowns and gloves were used for the procedure. A timeout was performed prior to the initiation of the procedure. Local anesthesia was provided with 1% lidocaine. Under direct ultrasound guidance, the basilic vein was accessed with a micropuncture kit after the overlying soft tissues were anesthetized with 1% lidocaine. After the overlying soft tissues were anesthetized, a small venotomy incision was created and a micropuncture kit was utilized to access the right basilic vein. Real-time ultrasound guidance was utilized for vascular access including the acquisition of a permanent ultrasound image documenting patency of the accessed vessel. A guidewire was advanced to the level of the superior caval-atrial junction for measurement purposes and the PICC line was cut to length. A peel-away  sheath was placed and a 38 cm, 5 [place], dual lumen was inserted to level of the superior caval-atrial junction. A post procedure spot fluoroscopic was obtained. The catheter easily aspirated and flushed and was sutured in place. A dressing was placed. The patient tolerated the procedure well without immediate post procedural complication. FINDINGS: After catheter placement, the tip lies within the superior cavoatrial junction. The catheter aspirates and flushes normally and is ready for immediate use. IMPRESSION: Successful ultrasound and fluoroscopic guided placement of a right basilic vein approach, 38 cm, 5 French, dual lumen PICC with tip at the superior caval-atrial junction. The PICC line is ready for immediate use. Electronically Signed   By: Sandi Mariscal M.D.   On: 12/03/2018 13:45   [place] Ekg Site Rite  Result Date: 12/02/2018 If Site Rite image not attached, placement could not be confirmed due to current cardiac rhythm.   Cardiac Studies   Procedure: Limited Echo  Indications:    Atrial fibrillation   History:        Patient has prior history of Echocardiogram examinations, most                 recent 03/13/2014. Risk Factors: Hypertension. Pulmonary HTN                 Cardiogenic shock                 Severe sepsis.   Sonographer:    Talmage Coin Referring Phys: [id number] RAHUL P DESAI  IMPRESSIONS    1. The left ventricle has severely reduced systolic function, with an ejection fraction of 25-30%. The cavity size was normal. There is mildly increased left ventricular wall thickness. Left ventricular diastolic function could not be evaluated  secondary to atrial fibrillation. Left ventricular diffuse hypokinesis.  2. The right ventricle has moderately reduced systolic function. The cavity was mildly enlarged. There is no increase in right ventricular wall thickness.  3. Left atrial size was severely dilated.  4. Right atrial size was moderately dilated.  5. The mitral valve  is abnormal. Mild thickening of the mitral valve leaflet. There is mild to moderate mitral annular calcification present.  6. The tricuspid valve was abnormal. Tricuspid valve regurgitation is mild-moderate.  7. The aortic valve is abnormal Mild calcification of the aortic valve. No stenosis of the aortic valve.  8. The aortic root and ascending aorta are normal in size and structure.  9. The interatrial septum was not well visualized.  SUMMARY   LVEF 25-30%, mild LVH, severe global hypokinesis, moderately reduced RV systolic function, severe LAE, moderate RAE, MAC with mild MR, mild to moderate TR, RVSP 37 mmHg + RAP  Assessment   Principal Problem:   Severe sepsis (HCC) Active Problems:  Hypertension   Diastolic dysfunction   Chronic atrial fibrillation with RVR   Cardiogenic shock ([place]   Plan   1. Mrs. Robinette has better afib control - she is willing to transition to po amiodarone. Switch to 400 mg po daily. Will need additional diuresis, she is noted to be 4.5L positive. Start lasix 40 mg IV q 12 hrs x 3 doses then reassess. Was on PRN lasix at home. Switch metoprolol tartrate to carvedilol 6.25 mg BID tonight for CHF. Consider adding low dose ARB if BP allows - renal function is normal.   Time Spent Directly with Patient:  I have spent a total of 25 minutes with the patient reviewing hospital notes, telemetry, EKGs, labs and examining the patient as well as establishing an assessment and plan that was discussed personally with the patient.  > 50% of time was spent in direct patient care.  Length of Stay:  LOS: 3 days   Pixie Casino, MD, [facility] [place], [place] Director of the [facility] Diplomate of the American Board of Clinical Lipidology Attending Cardiologist  Direct Dial: [phone]   Fax: [phone]  Website:  [email or web address] 12/04/2018, 2:31 PM

## 2018-12-04 NOTE — Evaluation (Signed)
Physical Therapy Evaluation Patient Details Name: Kelsey [name] MRN: [id number] DOB: 1932-07-06 Today's Date: 12/04/2018   History of Present Illness  Patient is an 85 y/o female presenting to the ED on 12/01/2018 with primary complaints of fever. Past medical history significant of pulmonary HTN; HTN; HLD; vocal cord paralysis;  Diastolic dysfunction; and afib previously on Xarelto. Admitted for Shock, likely mixed septic and cardiogenic work-up.    Clinical Impression  Patient admitted with the above listed diagnosis. Patient reports requiring some assist form husband at baseline with fairly sedentary lifestyle. Patient today requiring Min A for sit to stand at recliner with Min guard for mobility with RW. Encouraged patient to mobilize with nursing staff to restroom to progress activity tolerance and strength. Will recommend HHPT and 24 hr/supervision at discharge. PT to continue to follow acutely.     Follow Up Recommendations Home health PT;Supervision/Assistance - 24 hour    Equipment Recommendations  None recommended by PT    Recommendations for Other Services       Precautions / Restrictions Precautions Precautions: Fall Restrictions Weight Bearing Restrictions: No      Mobility  Bed Mobility               General bed mobility comments: up in recliner  Transfers Overall transfer level: Needs assistance Equipment used: Rolling walker (2 wheeled) Transfers: Sit to/from Stand Sit to Stand: Min assist         General transfer comment: MIn A to power up from recliner  Ambulation/Gait Ambulation/Gait assistance: Min guard Gait Distance (Feet): 75 Feet Assistive device: Rolling walker (2 wheeled) Gait Pattern/deviations: Step-through pattern;Decreased stride length Gait velocity: decreased   General Gait Details: slow ateady pace of gait; mild SOB with SpO2 desat to 85 on RA with mobility  Stairs            Wheelchair Mobility     Modified Rankin (Stroke Patients Only)       Balance Overall balance assessment: Needs assistance Sitting-balance support: No upper extremity supported;Feet supported Sitting balance-Leahy Scale: Fair     Standing balance support: Bilateral upper extremity supported;During functional activity Standing balance-Leahy Scale: Poor Standing balance comment: reliant on RW                             Pertinent Vitals/Pain Pain Assessment: No/denies pain    Home Living Family/patient expects to be discharged to:: Private residence Living Arrangements: Spouse/significant other Available Help at Discharge: Family;Available 24 hours/day Type of Home: House Home Access: Stairs to enter(in the process of getting a chair step) Entrance Stairs-Rails: Left Entrance Stairs-Number of Steps: 5 Home Layout: One level Home Equipment: Bedside commode;Walker - 2 wheels      Prior Function Level of Independence: Needs assistance   Gait / Transfers Assistance Needed: intermittent use of RW  ADL's / Homemaking Assistance Needed: husband assist with household tasks        Hand Dominance        Extremity/Trunk Assessment   Upper Extremity Assessment Upper Extremity Assessment: Defer to OT evaluation    Lower Extremity Assessment Lower Extremity Assessment: Generalized weakness    Cervical / Trunk Assessment Cervical / Trunk Assessment: Normal  Communication   Communication: No difficulties  Cognition Arousal/Alertness: Awake/alert Behavior During Therapy: WFL for tasks assessed/performed Overall Cognitive Status: Within Functional Limits for tasks assessed  General Comments      Exercises     Assessment/Plan    PT Assessment Patient needs continued PT services  PT Problem List Decreased strength;Decreased activity tolerance;Decreased balance;Decreased mobility;Decreased knowledge of use of DME;Decreased  safety awareness       PT Treatment Interventions DME instruction;Gait training;Stair training;Functional mobility training;Therapeutic activities;Therapeutic exercise;Balance training;Patient/family education    PT Goals (Current goals can be found in the Care Plan section)  Acute Rehab PT Goals Patient Stated Goal: return home soon PT Goal Formulation: With patient Time For Goal Achievement: 12/18/18 Potential to Achieve Goals: Good    Frequency Min 3X/week   Barriers to discharge        Co-evaluation               AM-PAC PT "6 Clicks" Mobility  Outcome Measure Help needed turning from your back to your side while in a flat bed without using bedrails?: A Little Help needed moving from lying on your back to sitting on the side of a flat bed without using bedrails?: A Little Help needed moving to and from a bed to a chair (including a wheelchair)?: A Little Help needed standing up from a chair using your arms (e.g., wheelchair or bedside chair)?: A Little Help needed to walk in hospital room?: A Little Help needed climbing 3-5 steps with a railing? : A Lot 6 Click Score: 17    End of Session Equipment Utilized During Treatment: Gait belt Activity Tolerance: Patient tolerated treatment well Patient left: in chair;with call bell/phone within reach Nurse Communication: Mobility status PT Visit Diagnosis: Unsteadiness on feet (R26.81);Other abnormalities of gait and mobility (R26.89);Muscle weakness (generalized) (M62.81)    Time: 4469-5072 PT Time Calculation (min) (ACUTE ONLY): 31 min   Charges:   PT Evaluation $PT Eval Moderate Complexity: 1 Mod PT Treatments $Gait Training: 8-22 mins         Lanney Gins, PT, DPT Supplemental Physical Therapist 12/04/18 3:45 PM Pager: [phone] Office: [phone]

## 2018-12-05 LAB — RENAL FUNCTION PANEL
Albumin: 2.3 g/dL — ABNORMAL LOW (ref 3.5–5.0)
Anion gap: 11 (ref 5–15)
BUN: 34 mg/dL — ABNORMAL HIGH (ref 8–23)
CO2: 26 mmol/L (ref 22–32)
Calcium: 8.7 mg/dL — ABNORMAL LOW (ref 8.9–10.3)
Chloride: 105 mmol/L (ref 98–111)
Creatinine, Ser: 1.03 mg/dL — ABNORMAL HIGH (ref 0.44–1.00)
GFR calc Af Amer: 58 mL/min — ABNORMAL LOW (ref >60)
GFR calc non Af Amer: 50 mL/min — ABNORMAL LOW (ref >60)
Glucose, Bld: 106 mg/dL — ABNORMAL HIGH (ref 70–99)
Phosphorus: 3.8 mg/dL (ref 2.5–4.6)
Potassium: 3.7 mmol/L (ref 3.5–5.1)
Sodium: 142 mmol/L (ref 135–145)

## 2018-12-05 LAB — CBC
HCT: 33.4 % — ABNORMAL LOW (ref 36.0–46.0)
Hemoglobin: 11 g/dL — ABNORMAL LOW (ref 12.0–15.0)
MCH: 34.2 pg — ABNORMAL HIGH (ref 26.0–34.0)
MCHC: 32.9 g/dL (ref 30.0–36.0)
MCV: 103.7 fL — ABNORMAL HIGH (ref 80.0–100.0)
Platelets: 156 10*3/uL (ref 150–400)
RBC: 3.22 MIL/uL — ABNORMAL LOW (ref 3.87–5.11)
RDW: 13.7 % (ref 11.5–15.5)
WBC: 8 10*3/uL (ref 4.0–10.5)
nRBC: 0 % (ref 0.0–0.2)

## 2018-12-05 LAB — GLUCOSE, CAPILLARY
Glucose-Capillary: 99 mg/dL (ref 70–99)
Glucose-Capillary: 100 mg/dL — ABNORMAL HIGH (ref 70–99)
Glucose-Capillary: 83 mg/dL (ref 70–99)
Glucose-Capillary: 135 mg/dL — ABNORMAL HIGH (ref 70–99)

## 2018-12-05 LAB — MAGNESIUM: Magnesium: 2 mg/dL (ref 1.7–2.4)

## 2018-12-05 MED ORDER — HYDROCORTISONE NA SUCCINATE PF 100 MG IJ SOLR
50.0000 mg | Freq: Two times a day (BID) | INTRAMUSCULAR | Status: DC
  Administered 2018-12-05 – 2018-12-06 (×2): 50 mg via INTRAVENOUS
  Filled 2018-12-05 (×2): qty 2

## 2018-12-05 NOTE — Progress Notes (Signed)
Marland Kitchen  PROGRESS NOTE    Kelsey [name]  JAS:[id number] DOB: 02-23-1932 DOA: 12/01/2018 PCP: [name], MD   Brief Narrative:   Kelsey Dowen Robinette-Hagieis a 85 y.o.femalewith medical history significant ofpulmonary HTN; HTN; HLD; vocal cord paralysis; Diastolic dysfunction; and afib previously on Xarelto presenting with myalgias. I spoke with her husband who reports that she woke up from a nap, she just didn't feel good and she kept getting worse. She wasn't able to move. She was somewhat SOB. No cough or respiratory. No urinary complaints. No n/v/d. She was a little subjectively warm. She has been off Xarelto "for a pretty good while"; she had "blood splotches on her hands" that they attributed to the Xarelto and so she stopped taking it - he thinks this was months ago. The patient describes generalized myalgias preventing her from getting out of bed. She has spinal stenosis and is normally sedentary but is able to move with a walker.   Assessment & Plan:   Principal Problem:   Severe sepsis ([facility]) Active Problems:   Hypertension   Diastolic dysfunction   Chronic atrial fibrillation with RVR   Cardiogenic shock (HCC)   Atrial fibrillation with RVR (HCC)   Elevated troponin   Acute on chronic combined systolic and diastolic CHF (congestive heart failure) (HCC)   Shock, septic - Patient presenting from home with sepsis physiology as well as apparent CHF with refractory afib - Cardiology appreciate assistance  Sepsis - SIRS criteria in this patient includes: Leukocytosis, fever, tachycardia, tachypnea, hypoxia  - Patient has evidence of acute organ failure with elevated lactate and hypotension - COVID negative - influenza A/B, resp viral panel negative - MRSA nasal swab negative - Bld CxNTD - Sepsis protocol initiated - Patient had initial lactate >4 or SBP <90/MAP <65 and so has received the 30 cc/kg IVF bolus.  - CXR was read as normal, although there are diffuse opacities that could be related to fluid overload or underlying infiltrates, or could be chronic changes.  - Her UA is unremarkable.  - continue IV Cefepime/Vanc for undifferentiated sepsis - transition to lvq to complete 7-days Tx? Still with no source, but improvement on abx; at very least can stop vanc today.     - cefepime/vanc transitioned to PO lvq w/ end of 12/06/18     - taper solu-cortef today; can stop tomorrow  Afib with RVR with cardiogenic shock - Patient with refractory afib at admission - initially thought to be exacerbated by hypovolemia; she has been fluid recussitated - Her CXR was mildly concerning for volume overload and her BNP is markedly elevated -  IV amio and Po metoprolol - echo pending - she stopped taking her Xarelto due to "blotches" on her hands; she is also a fall risk and she chronic AC may not be her best option; have talked to her about this and she is aware of the risks - on lovenox tx-dose - she is questioning if she can have a watchman device; asked cards about this, not appropriate in her - metoprolol 6m BID transitioned to coreg 6.243mBID     - IV amio transitioned to PO amio 40027mID     - have spoken again about the need for long term AC vs her fall risks; she is not in favor of resuming AC at this time     - appreciate cards assistance  Chronic diastolic dysfunction - Echo in 11/19 with preserved EF, normal diastolic function, and mild pulmonary  HTN - Echo: LVEF 25-30%, mild LVH, severe global hypokinesis, moderately reduced RV systolic function, severe LAE, moderate RAE, MAC with mild MR, mild to moderate TR, RVSP 37 mmHg + RAP     - coreg as above; ACEi?  HTN - BPs ok     - coreg 6.58m BID  CKD3 - monitor SCr, UOP, watch nephrotoxins   Falls - reports multiple falls, but says last was in November. Has  not followed up on rehab program - PT recs HHPT, she agrees to this     - compression stocking   DVT prophylaxis: lovenox Code Status: DNR   [facility]  Consultants:   Cardiology   Antimicrobials:  . Levaquin    Subjective: "My legs are swollen"  Objective: Vitals:   12/04/18 1723 12/04/18 1903 12/04/18 2300 12/05/18 0300  BP: 110/84 (!) 135/97 (!) 124/93 (!) 121/91  Pulse: 68 (!) 106 (!) 124 (!) 102  Resp: 16 19 (!) 26 16  Temp: 97.8 F (36.6 C) 97.7 F (36.5 C) 97.7 F (36.5 C) (!) 97.5 F (36.4 C)  TempSrc: Oral Oral Oral Oral  SpO2: 100% 91% 99% 98%  Weight:    83.7 kg  Height:        Intake/Output Summary (Last 24 hours) at 12/05/2018 0633 Last data filed at 12/04/2018 1547 Gross per 24 hour  Intake 800.23 ml  Output -  Net 800.23 ml   Filed Weights   12/03/18 0329 12/04/18 0500 12/05/18 0300  Weight: 82.3 kg 84.8 kg 83.7 kg    Examination:  General:86 y.o.femaleresting in bed in NAD Cardiovascular:irregular irregular, +S1, S2, equal pulses throughout Respiratory:CTABL, normal WOB GI: BS+, NDNT, no masses noted, no organomegaly noted MSK: BLE edema, no c/c Neuro: A&O x 3, no focal deficits     Data Reviewed: I have personally reviewed following labs and imaging studies.  CBC: Recent Labs  Lab 12/01/18 0317 12/01/18 0509 12/02/18 0407 12/03/18 0638 12/04/18 0500  WBC 15.7*  --  12.6* 9.9 8.8  NEUTROABS 13.0*  --   --  8.3*  --   HGB 14.9 11.9* 12.8 11.8* 11.7*  HCT 44.5 35.0* 38.9 35.8* 36.2  MCV 104.0*  --  103.7* 102.9* 104.9*  PLT 206  --  158 169 1462  Basic Metabolic Panel: Recent Labs  Lab 12/01/18 0317 12/01/18 0509 12/02/18 0407 12/03/18 0633 12/03/18 0638 12/04/18 0844  NA 138 137 139 140  --  139  K 5.3* 4.7 4.0 3.4*  --  4.0  CL 102  --  108 108  --  103  CO2 22  --  22 25  --  24  GLUCOSE 130*  --  134* 82  --  125*  BUN 33*  --  28* 29*  --  27*  CREATININE 1.46*  --  0.91 0.92   --  0.89  CALCIUM 8.5*  --  8.2* 8.3*  --  8.8*  MG  --   --   --   --  1.9 2.0  PHOS  --   --   --  3.4  --  3.1   GFR: Estimated Creatinine Clearance: 53.3 mL/min (by C-G formula based on SCr of 0.89 mg/dL). Liver Function Tests: Recent Labs  Lab 12/03/18 0633 12/04/18 0844  ALBUMIN 2.5* 2.7*   No results for input(s): LIPASE, AMYLASE in the last 168 hours. No results for input(s): AMMONIA in the last 168 hours. Coagulation Profile: No results  for input(s): INR, PROTIME in the last 168 hours. Cardiac Enzymes: Recent Labs  Lab 12/01/18 0317 12/01/18 0711 12/01/18 1245 12/01/18 1837  TROPONINI 0.54* 0.43* 0.32* 0.18*   BNP (last 3 results) No results for input(s): PROBNP in the last 8760 hours. HbA1C: No results for input(s): HGBA1C in the last 72 hours. CBG: Recent Labs  Lab 12/03/18 2119 12/04/18 0817 12/04/18 1153 12/04/18 1721 12/04/18 2107  GLUCAP 151* 115* 150* 112* 156*   Lipid Profile: No results for input(s): CHOL, HDL, LDLCALC, TRIG, CHOLHDL, LDLDIRECT in the last 72 hours. Thyroid Function Tests: No results for input(s): TSH, T4TOTAL, FREET4, T3FREE, THYROIDAB in the last 72 hours. Anemia Panel: Recent Labs    12/04/18 0844  VITAMINB12 320   Sepsis Labs: Recent Labs  Lab 12/01/18 0711 12/01/18 1245 12/01/18 1837 12/02/18 0407 12/02/18 1946 12/02/18 2221 12/03/18 0633  PROCALCITON 6.46 7.32  --  5.78  --   --   --   LATICACIDVEN 2.6* 2.4* 2.9*  --  3.9* 2.9* 1.2    Recent Results (from the past 240 hour(s))  Urine culture     Status: None   Collection Time: 12/01/18  3:31 AM  Result Value Ref Range Status   Specimen Description URINE, RANDOM  Final   Special Requests NONE  Final   Culture   Final    NO GROWTH Performed at [facility], [place] [address] [place], [place] [address]    Report Status 12/02/2018 FINAL  Final  Blood culture (routine x 2)     Status: None (Preliminary result)   Collection Time: 12/01/18  4:00 AM   Result Value Ref Range Status   Specimen Description BLOOD RIGHT WRIST  Final   Special Requests   Final    BOTTLES DRAWN AEROBIC ONLY Blood Culture adequate volume   Culture   Final    NO GROWTH 3 DAYS Performed at [facility], [place] [address] [place], [place] [address]    Report Status PENDING  Incomplete  SARS Coronavirus 2 (CEPHEID- Performed in [facility] hospital lab), Hosp Order     Status: None   Collection Time: 12/01/18  4:02 AM  Result Value Ref Range Status   SARS Coronavirus 2 NEGATIVE NEGATIVE Final    Comment: (NOTE) If result is NEGATIVE SARS-CoV-2 target nucleic acids are NOT DETECTED. The SARS-CoV-2 RNA is generally detectable in upper and lower  respiratory specimens during the acute phase of infection. The lowest  concentration of SARS-CoV-2 viral copies this assay can detect is 250  copies / mL. A negative result does not preclude SARS-CoV-2 infection  and should not be used as the sole basis for treatment or other  patient management decisions.  A negative result may occur with  improper specimen collection / handling, submission of specimen other  than nasopharyngeal swab, presence of viral mutation(s) within the  areas targeted by this assay, and inadequate number of viral copies  (<250 copies / mL). A negative result must be combined with clinical  observations, patient history, and epidemiological information. If result is POSITIVE SARS-CoV-2 target nucleic acids are DETECTED. The SARS-CoV-2 RNA is generally detectable in upper and lower  respiratory specimens dur ing the acute phase of infection.  Positive  results are indicative of active infection with SARS-CoV-2.  Clinical  correlation with patient history and other diagnostic information is  necessary to determine patient infection status.  Positive results do  not rule out bacterial infection or co-infection with other  viruses. If result is PRESUMPTIVE POSTIVE SARS-CoV-2 nucleic acids MAY  BE PRESENT.   A presumptive positive result was obtained on the submitted specimen  and confirmed on repeat testing.  While 2019 novel coronavirus  (SARS-CoV-2) nucleic acids may be present in the submitted sample  additional confirmatory testing may be necessary for epidemiological  and / or clinical management purposes  to differentiate between  SARS-CoV-2 and other Sarbecovirus currently known to infect humans.  If clinically indicated additional testing with an alternate test  methodology [phone]) is advised. The SARS-CoV-2 RNA is generally  detectable in upper and lower respiratory sp ecimens during the acute  phase of infection. The expected result is Negative. Fact Sheet for Patients:  StrictlyIdeas.no Fact Sheet for Healthcare Providers: BankingDealers.co.za This test is not yet approved or cleared by the [place] FDA and has been authorized for detection and/or diagnosis of SARS-CoV-2 by FDA under an Emergency Use Authorization (EUA).  This EUA will remain in effect (meaning this test can be used) for the duration of the COVID-19 declaration under Section 564(b)(1) of the Act, 21 U.S.C. section 360bbb-3(b)(1), unless the authorization is terminated or revoked sooner. Performed at [facility], [place] [address], [place], [place] [address]   Respiratory Panel by PCR     Status: None   Collection Time: 12/01/18  4:02 AM  Result Value Ref Range Status   Adenovirus NOT DETECTED NOT DETECTED Final   Coronavirus 229E NOT DETECTED NOT DETECTED Final    Comment: (NOTE) The Coronavirus on the Respiratory Panel, DOES NOT test for the novel  Coronavirus (2019 nCoV)    Coronavirus HKU1 NOT DETECTED NOT DETECTED Final   Coronavirus NL63 NOT DETECTED NOT DETECTED Final   Coronavirus OC43 NOT DETECTED NOT DETECTED Final   Metapneumovirus NOT DETECTED NOT DETECTED Final   Rhinovirus / Enterovirus NOT DETECTED NOT DETECTED Final    Influenza A NOT DETECTED NOT DETECTED Final   Influenza B NOT DETECTED NOT DETECTED Final   Parainfluenza Virus 1 NOT DETECTED NOT DETECTED Final   Parainfluenza Virus 2 NOT DETECTED NOT DETECTED Final   Parainfluenza Virus 3 NOT DETECTED NOT DETECTED Final   Parainfluenza Virus 4 NOT DETECTED NOT DETECTED Final   Respiratory Syncytial Virus NOT DETECTED NOT DETECTED Final   Bordetella pertussis NOT DETECTED NOT DETECTED Final   Chlamydophila pneumoniae NOT DETECTED NOT DETECTED Final   Mycoplasma pneumoniae NOT DETECTED NOT DETECTED Final    Comment: Performed at [facility], [place]. [address], [place], [place] [address]  Blood culture (routine x 2)     Status: None (Preliminary result)   Collection Time: 12/01/18  4:22 AM  Result Value Ref Range Status   Specimen Description BLOOD LEFT WRIST  Final   Special Requests   Final    BOTTLES DRAWN AEROBIC AND ANAEROBIC Blood Culture adequate volume   Culture   Final    NO GROWTH 3 DAYS Performed at [facility], [address] [place], [place] [address]    Report Status PENDING  Incomplete  MRSA PCR Screening     Status: None   Collection Time: 12/01/18  4:43 PM  Result Value Ref Range Status   MRSA by PCR NEGATIVE NEGATIVE Final    Comment:        The GeneXpert MRSA Assay (FDA approved for NASAL specimens only), is one component of a comprehensive MRSA colonization surveillance program. It is not intended to diagnose MRSA infection nor to guide or  monitor treatment for MRSA infections. Performed at [facility], [place] [address], [place], [place] [address]          Radiology Studies: Irpicc Placement Left >5 Yrs Inc Img Guide  Result Date: 12/03/2018 INDICATION: Poor venous access. In need of durable intravenous access for medication administration and blood draws while hospitalized. EXAM: ULTRASOUND AND FLUOROSCOPIC GUIDED PICC LINE INSERTION MEDICATIONS: None. CONTRAST:  None FLUOROSCOPY TIME:  18 seconds  (0.7 mGy) COMPLICATIONS: None immediate. TECHNIQUE: The procedure, risks, benefits, and alternatives were explained to the patient and informed written consent was obtained. A timeout was performed prior to the initiation of the procedure. The right upper extremity was prepped with chlorhexidine in a sterile fashion, and a sterile drape was applied covering the operative field. Maximum barrier sterile technique with sterile gowns and gloves were used for the procedure. A timeout was performed prior to the initiation of the procedure. Local anesthesia was provided with 1% lidocaine. Under direct ultrasound guidance, the basilic vein was accessed with a micropuncture kit after the overlying soft tissues were anesthetized with 1% lidocaine. After the overlying soft tissues were anesthetized, a small venotomy incision was created and a micropuncture kit was utilized to access the right basilic vein. Real-time ultrasound guidance was utilized for vascular access including the acquisition of a permanent ultrasound image documenting patency of the accessed vessel. A guidewire was advanced to the level of the superior caval-atrial junction for measurement purposes and the PICC line was cut to length. A peel-away sheath was placed and a 38 cm, 5 [place], dual lumen was inserted to level of the superior caval-atrial junction. A post procedure spot fluoroscopic was obtained. The catheter easily aspirated and flushed and was sutured in place. A dressing was placed. The patient tolerated the procedure well without immediate post procedural complication. FINDINGS: After catheter placement, the tip lies within the superior cavoatrial junction. The catheter aspirates and flushes normally and is ready for immediate use. IMPRESSION: Successful ultrasound and fluoroscopic guided placement of a right basilic vein approach, 38 cm, 5 [place], dual lumen PICC with tip at the superior caval-atrial junction. The PICC line is ready for  immediate use. Electronically Signed   By: Sandi Mariscal M.D.   On: 12/03/2018 13:45        Scheduled Meds: . amiodarone  400 mg Oral Daily  . carvedilol  6.25 mg Oral BID WC  . docusate sodium  100 mg Oral BID  . enoxaparin (LOVENOX) injection  70 mg Subcutaneous Q12H  . furosemide  40 mg Intravenous BID  . hydrocortisone sod succinate (SOLU-CORTEF) inj  50 mg Intravenous Q6H  . latanoprost  1 drop Both Eyes QHS  . levofloxacin  750 mg Oral Daily  . mupirocin ointment   Topical BID  . sodium chloride flush  3 mL Intravenous Q12H   Continuous Infusions:   LOS: 4 days    Time spent: 25 minutes spent in the coordination of care today.    Jonnie Finner, DO [facility] Pager [phone]  If 7PM-7AM, please contact night-coverage [email or web address] Password [facility] 12/05/2018, 6:33 AM

## 2018-12-05 NOTE — Progress Notes (Signed)
DAILY PROGRESS NOTE   Patient Name: Kelsey [name] Date of Encounter: 12/05/2018 Cardiologist: Mahala Menghini, MD  Chief Complaint   Sitting in chair getting bath Primary complaint is LE edema some pain in left foot Normally wears compression stockings at home   Patient Profile   85 yr female with a PMH of HTN, HLD, pulmonary HTN, atrial fibrillation not on anticoagulation, vocal cord paralysis, and chronic prednisone use for unknown reason,whowas seen forthe evaluation ofatrial fibrillation with RVR.   Objective   Vitals:   12/04/18 1903 12/04/18 2300 12/05/18 0300 12/05/18 0734  BP: (!) 135/97 (!) 124/93 (!) 121/91 128/90  Pulse: (!) 106 (!) 124 (!) 102 74  Resp: 19 (!) _0 Temp: 97.7 F (36.5 C) 97.7 F (36.5 C) (!) 97.5 F (36.4 C) (!) 97.5 F (36.4 C)  TempSrc: Oral Oral Oral Oral  SpO2: 91% 99% 98% 100%  Weight:   83.7 kg   Height:        Intake/Output Summary (Last 24 hours) at 12/05/2018 0949 Last data filed at 12/04/2018 1547 Gross per 24 hour  Intake 700.23 ml  Output --  Net 700.23 ml   Filed Weights   12/03/18 0329 12/04/18 0500 12/05/18 0300  Weight: 82.3 kg 84.8 kg 83.7 kg    Physical Exam   Affect appropriate Chronically ill white female  HEENT: normal Neck supple with no adenopathy JVP normal no bruits no thyromegaly Lungs clear with no wheezing and good diaphragmatic motion Heart:  S1/S2 no murmur, no rub, gallop or click PMI normal Abdomen: benighn, BS positve, no tenderness, no AAA no bruit.  No HSM or HJR Distal pulses intact with no bruits Neuro non-focal Skin warm and dry No muscular weakness Chronic venous edema with discoloration Tegaderm LLE and on right foot 5th  Toe plus 2 edema    Inpatient Medications    Scheduled Meds:  amiodarone  400 mg Oral Daily   carvedilol  6.25 mg Oral BID WC   docusate sodium  100 mg Oral BID   enoxaparin (LOVENOX) injection  70 mg Subcutaneous Q12H   furosemide  40  mg Intravenous BID   hydrocortisone sod succinate (SOLU-CORTEF) inj  50 mg Intravenous Q12H   latanoprost  1 drop Both Eyes QHS   levofloxacin  750 mg Oral Daily   mupirocin ointment   Topical BID   sodium chloride flush  3 mL Intravenous Q12H    Continuous Infusions:   PRN Meds: acetaminophen **OR** acetaminophen, diphenhydrAMINE, HYDROcodone-acetaminophen, lidocaine, ondansetron **OR** ondansetron (ZOFRAN) IV, polyethylene glycol, polyvinyl alcohol   Labs   Results for orders placed or performed during the hospital encounter of 12/01/18 (from the past 48 hour(s))  Glucose, capillary     Status: Abnormal   Collection Time: 12/03/18  1:20 PM  Result Value Ref Range   Glucose-Capillary 106 (H) 70 - 99 mg/dL  Glucose, capillary     Status: Abnormal   Collection Time: 12/03/18  5:03 PM  Result Value Ref Range   Glucose-Capillary 110 (H) 70 - 99 mg/dL  Glucose, capillary     Status: Abnormal   Collection Time: 12/03/18  9:19 PM  Result Value Ref Range   Glucose-Capillary 151 (H) 70 - 99 mg/dL  CBC     Status: Abnormal   Collection Time: 12/04/18  5:00 AM  Result Value Ref Range   WBC 8.8 4.0 - 10.5 K/uL   RBC 3.45 (L) 3.87 - 5.11 MIL/uL   Hemoglobin 11.7 (L)  12.0 - 15.0 g/dL   HCT 36.2 36.0 - 46.0 %   MCV 104.9 (H) 80.0 - 100.0 fL   MCH 33.9 26.0 - 34.0 pg   MCHC 32.3 30.0 - 36.0 g/dL   RDW 14.3 11.5 - 15.5 %   Platelets 163 150 - 400 K/uL   nRBC 0.0 0.0 - 0.2 %    Comment: Performed at [facility], [place] [address] [place], [place] [address]  Glucose, capillary     Status: Abnormal   Collection Time: 12/04/18  8:17 AM  Result Value Ref Range   Glucose-Capillary 115 (H) 70 - 99 mg/dL  Renal function panel     Status: Abnormal   Collection Time: 12/04/18  8:44 AM  Result Value Ref Range   Sodium 139 135 - 145 mmol/L   Potassium 4.0 3.5 - 5.1 mmol/L   Chloride 103 98 - 111 mmol/L   CO2 24 22 - 32 mmol/L   Glucose, Bld 125 (H) 70 - 99 mg/dL   BUN 27 (H) 8 -  23 mg/dL   Creatinine, Ser 0.89 0.44 - 1.00 mg/dL   Calcium 8.8 (L) 8.9 - 10.3 mg/dL   Phosphorus 3.1 2.5 - 4.6 mg/dL   Albumin 2.7 (L) 3.5 - 5.0 g/dL   GFR calc non Af Amer 60 (L) >60 mL/min   GFR calc Af Amer >60 >60 mL/min   Anion gap 12 5 - 15    Comment: Performed at [facility] [address] [place], [place] [address]  Magnesium     Status: None   Collection Time: 12/04/18  8:44 AM  Result Value Ref Range   Magnesium 2.0 1.7 - 2.4 mg/dL    Comment: Performed at [facility] [address] [place], [place] [address]  Vitamin B12     Status: None   Collection Time: 12/04/18  8:44 AM  Result Value Ref Range   Vitamin B-12 320 180 - 914 pg/mL    Comment: (NOTE) This assay is not validated for testing neonatal or myeloproliferative syndrome specimens for Vitamin B12 levels. Performed at [facility], [place] [address] [place], [place] [address]   Glucose, capillary     Status: Abnormal   Collection Time: 12/04/18 11:53 AM  Result Value Ref Range   Glucose-Capillary 150 (H) 70 - 99 mg/dL  Glucose, capillary     Status: Abnormal   Collection Time: 12/04/18  5:21 PM  Result Value Ref Range   Glucose-Capillary 112 (H) 70 - 99 mg/dL  Glucose, capillary     Status: Abnormal   Collection Time: 12/04/18  9:07 PM  Result Value Ref Range   Glucose-Capillary 156 (H) 70 - 99 mg/dL  Renal function panel     Status: Abnormal   Collection Time: 12/05/18  5:00 AM  Result Value Ref Range   Sodium 142 135 - 145 mmol/L   Potassium 3.7 3.5 - 5.1 mmol/L   Chloride 105 98 - 111 mmol/L   CO2 26 22 - 32 mmol/L   Glucose, Bld 106 (H) 70 - 99 mg/dL   BUN 34 (H) 8 - 23 mg/dL   Creatinine, Ser 1.03 (H) 0.44 - 1.00 mg/dL   Calcium 8.7 (L) 8.9 - 10.3 mg/dL   Phosphorus 3.8 2.5 - 4.6 mg/dL   Albumin 2.3 (L) 3.5 - 5.0 g/dL   GFR calc non Af Amer 50 (L) >60 mL/min   GFR calc Af Amer 58 (L) >60 mL/min  Anion gap 11 5 - 15    Comment: Performed at [facility] [address], [place], [place] [address]  CBC     Status: Abnormal   Collection Time: 12/05/18  5:30 AM  Result Value Ref Range   WBC 8.0 4.0 - 10.5 K/uL   RBC 3.22 (L) 3.87 - 5.11 MIL/uL   Hemoglobin 11.0 (L) 12.0 - 15.0 g/dL   HCT 33.4 (L) 36.0 - 46.0 %   MCV 103.7 (H) 80.0 - 100.0 fL   MCH 34.2 (H) 26.0 - 34.0 pg   MCHC 32.9 30.0 - 36.0 g/dL   RDW 13.7 11.5 - 15.5 %   Platelets 156 150 - 400 K/uL   nRBC 0.0 0.0 - 0.2 %    Comment: Performed at [facility], [place] [address] [place], [place] [address]  Magnesium     Status: None   Collection Time: 12/05/18  5:30 AM  Result Value Ref Range   Magnesium 2.0 1.7 - 2.4 mg/dL    Comment: Performed at [facility] [address], [place], [place] [address]  Glucose, capillary     Status: None   Collection Time: 12/05/18  7:33 AM  Result Value Ref Range   Glucose-Capillary 99 70 - 99 mg/dL    ECG   N/A  Telemetry   Afib with CVR - Personally Reviewed  Radiology    Irpicc Placement Left >5 Yrs Inc Img Guide  Result Date: 12/03/2018 INDICATION: Poor venous access. In need of durable intravenous access for medication administration and blood draws while hospitalized. EXAM: ULTRASOUND AND FLUOROSCOPIC GUIDED PICC LINE INSERTION MEDICATIONS: None. CONTRAST:  None FLUOROSCOPY TIME:  18 seconds (0.7 mGy) COMPLICATIONS: None immediate. TECHNIQUE: The procedure, risks, benefits, and alternatives were explained to the patient and informed written consent was obtained. A timeout was performed prior to the initiation of the procedure. The right upper extremity was prepped with chlorhexidine in a sterile fashion, and a sterile drape was applied covering the operative field. Maximum barrier sterile technique with sterile gowns and gloves were used for the procedure. A timeout was performed prior to the initiation of the procedure. Local anesthesia was provided with 1% lidocaine. Under direct ultrasound guidance, the basilic vein was  accessed with a micropuncture kit after the overlying soft tissues were anesthetized with 1% lidocaine. After the overlying soft tissues were anesthetized, a small venotomy incision was created and a micropuncture kit was utilized to access the right basilic vein. Real-time ultrasound guidance was utilized for vascular access including the acquisition of a permanent ultrasound image documenting patency of the accessed vessel. A guidewire was advanced to the level of the superior caval-atrial junction for measurement purposes and the PICC line was cut to length. A peel-away sheath was placed and a 38 cm, 5 [place], dual lumen was inserted to level of the superior caval-atrial junction. A post procedure spot fluoroscopic was obtained. The catheter easily aspirated and flushed and was sutured in place. A dressing was placed. The patient tolerated the procedure well without immediate post procedural complication. FINDINGS: After catheter placement, the tip lies within the superior cavoatrial junction. The catheter aspirates and flushes normally and is ready for immediate use. IMPRESSION: Successful ultrasound and fluoroscopic guided placement of a right basilic vein approach, 38 cm, 5 French, dual lumen PICC with tip at the superior caval-atrial junction. The PICC line is ready for immediate use. Electronically Signed   By: Eldridge Abrahams.D.  On: 12/03/2018 13:45    Cardiac Studies   Procedure: Limited Echo  Indications:    Atrial fibrillation   History:        Patient has prior history of Echocardiogram examinations, most                 recent 03/13/2014. Risk Factors: Hypertension. Pulmonary HTN                 Cardiogenic shock                 Severe sepsis.   Sonographer:    Talmage Coin Referring Phys: [id number] RAHUL P DESAI  IMPRESSIONS    1. The left ventricle has severely reduced systolic function, with an ejection fraction of 25-30%. The cavity size was normal. There is mildly increased  left ventricular wall thickness. Left ventricular diastolic function could not be evaluated  secondary to atrial fibrillation. Left ventricular diffuse hypokinesis.  2. The right ventricle has moderately reduced systolic function. The cavity was mildly enlarged. There is no increase in right ventricular wall thickness.  3. Left atrial size was severely dilated.  4. Right atrial size was moderately dilated.  5. The mitral valve is abnormal. Mild thickening of the mitral valve leaflet. There is mild to moderate mitral annular calcification present.  6. The tricuspid valve was abnormal. Tricuspid valve regurgitation is mild-moderate.  7. The aortic valve is abnormal Mild calcification of the aortic valve. No stenosis of the aortic valve.  8. The aortic root and ascending aorta are normal in size and structure.  9. The interatrial septum was not well visualized.  SUMMARY   LVEF 25-30%, mild LVH, severe global hypokinesis, moderately reduced RV systolic function, severe LAE, moderate RAE, MAC with mild MR, mild to moderate TR, RVSP 37 mmHg + RAP  Assessment   Principal Problem:   Severe sepsis (HCC) Active Problems:   Hypertension   Diastolic dysfunction   Chronic atrial fibrillation with RVR   Cardiogenic shock (HCC)   Atrial fibrillation with RVR (HCC)   Elevated troponin   Acute on chronic combined systolic and diastolic CHF (congestive heart failure) ([place])   Plan   1. DCM  On 40 mg iv lasix bid and coreg start low dose ACE and follow BP  2. Afib:  On amiodarone 400 daily has had tremors on it in past follow rate control is fine Previously on xarelto that patient stopped on her own will need to readdress need for anticoagulation prior to d/c   3. Venous Insufficiency:  Continue diuresis consider wound care consult Follows with Dr Donzetta Matters VVS compression stockings    Jenkins Rouge

## 2018-12-06 LAB — FOLATE RBC
Folate, Hemolysate: 379 ng/mL
Folate, RBC: 967 ng/mL (ref >498)
Hematocrit: 39.2 % (ref 34.0–46.6)

## 2018-12-06 LAB — CBC WITH DIFFERENTIAL/PLATELET
Abs Immature Granulocytes: 0.27 10*3/uL — ABNORMAL HIGH (ref 0.00–0.07)
Basophils Absolute: 0 10*3/uL (ref 0.0–0.1)
Basophils Relative: 0 %
Eosinophils Absolute: 0 10*3/uL (ref 0.0–0.5)
Eosinophils Relative: 0 %
HCT: 33.6 % — ABNORMAL LOW (ref 36.0–46.0)
Hemoglobin: 11.2 g/dL — ABNORMAL LOW (ref 12.0–15.0)
Immature Granulocytes: 4 %
Lymphocytes Relative: 12 %
Lymphs Abs: 0.9 10*3/uL (ref 0.7–4.0)
MCH: 34 pg (ref 26.0–34.0)
MCHC: 33.3 g/dL (ref 30.0–36.0)
MCV: 102.1 fL — ABNORMAL HIGH (ref 80.0–100.0)
Monocytes Absolute: 0.6 10*3/uL (ref 0.1–1.0)
Monocytes Relative: 9 %
Neutro Abs: 5.5 10*3/uL (ref 1.7–7.7)
Neutrophils Relative %: 75 %
Platelets: 164 10*3/uL (ref 150–400)
RBC: 3.29 MIL/uL — ABNORMAL LOW (ref 3.87–5.11)
RDW: 13.9 % (ref 11.5–15.5)
WBC: 7.4 10*3/uL (ref 4.0–10.5)
nRBC: 0 % (ref 0.0–0.2)

## 2018-12-06 LAB — GLUCOSE, CAPILLARY
Glucose-Capillary: 73 mg/dL (ref 70–99)
Glucose-Capillary: 77 mg/dL (ref 70–99)
Glucose-Capillary: 90 mg/dL (ref 70–99)
Glucose-Capillary: 149 mg/dL — ABNORMAL HIGH (ref 70–99)

## 2018-12-06 LAB — CULTURE, BLOOD (ROUTINE X 2)
Culture: NO GROWTH
Culture: NO GROWTH
Special Requests: ADEQUATE
Special Requests: ADEQUATE

## 2018-12-06 LAB — RENAL FUNCTION PANEL
Albumin: 2.3 g/dL — ABNORMAL LOW (ref 3.5–5.0)
Anion gap: 8 (ref 5–15)
BUN: 33 mg/dL — ABNORMAL HIGH (ref 8–23)
CO2: 27 mmol/L (ref 22–32)
Calcium: 8.4 mg/dL — ABNORMAL LOW (ref 8.9–10.3)
Chloride: 105 mmol/L (ref 98–111)
Creatinine, Ser: 0.99 mg/dL (ref 0.44–1.00)
GFR calc Af Amer: >60 mL/min (ref >60)
GFR calc non Af Amer: 52 mL/min — ABNORMAL LOW (ref >60)
Glucose, Bld: 85 mg/dL (ref 70–99)
Phosphorus: 3.6 mg/dL (ref 2.5–4.6)
Potassium: 3.2 mmol/L — ABNORMAL LOW (ref 3.5–5.1)
Sodium: 140 mmol/L (ref 135–145)

## 2018-12-06 LAB — MAGNESIUM: Magnesium: 1.9 mg/dL (ref 1.7–2.4)

## 2018-12-06 MED ORDER — PREDNISONE 10 MG PO TABS
10.0000 mg | ORAL_TABLET | Freq: Every day | ORAL | Status: DC
  Administered 2018-12-06 – 2018-12-09 (×4): 10 mg via ORAL
  Filled 2018-12-06 (×4): qty 1

## 2018-12-06 MED ORDER — POTASSIUM CHLORIDE CRYS ER 20 MEQ PO TBCR
40.0000 meq | EXTENDED_RELEASE_TABLET | ORAL | Status: AC
  Administered 2018-12-06 (×2): 40 meq via ORAL
  Filled 2018-12-06 (×2): qty 2

## 2018-12-06 MED ORDER — FUROSEMIDE 10 MG/ML IJ SOLN
60.0000 mg | Freq: Once | INTRAMUSCULAR | Status: AC
  Administered 2018-12-06: 60 mg via INTRAVENOUS
  Filled 2018-12-06: qty 6

## 2018-12-06 MED ORDER — FUROSEMIDE 10 MG/ML IJ SOLN
40.0000 mg | Freq: Two times a day (BID) | INTRAMUSCULAR | Status: DC
  Administered 2018-12-07 – 2018-12-08 (×3): 40 mg via INTRAVENOUS
  Filled 2018-12-06 (×3): qty 4

## 2018-12-06 MED ORDER — CARVEDILOL 12.5 MG PO TABS
12.5000 mg | ORAL_TABLET | Freq: Two times a day (BID) | ORAL | Status: DC
  Administered 2018-12-06 – 2018-12-07 (×2): 12.5 mg via ORAL
  Filled 2018-12-06 (×2): qty 1

## 2018-12-06 NOTE — Progress Notes (Signed)
Marland Kitchen  PROGRESS NOTE    Laurenashley Viar [name]  ZOX:[id number] DOB: [birth year] DOA: 12/01/2018 PCP: [name], MD   Brief Narrative:   Kelsey Folkes Robinette-Hagieis a 85 y.o.femalewith medical history significant ofpulmonary HTN; HTN; HLD; vocal cord paralysis; Diastolic dysfunction; and afib previously on Xarelto presenting with myalgias. I spoke with her husband who reports that she woke up from a nap, she just didn't feel good and she kept getting worse. She wasn't able to move. She was somewhat SOB. No cough or respiratory. No urinary complaints. No n/v/d. She was a little subjectively warm. She has been off Xarelto "for a pretty good while"; she had "blood splotches on her hands" that they attributed to the Xarelto and so she stopped taking it - he thinks this was months ago. The patient describes generalized myalgias preventing her from getting out of bed. She has spinal stenosis and is normally sedentary but is able to move with a walker.   Assessment & Plan:   Principal Problem:   Severe sepsis ([place]) Active Problems:   Hypertension   Diastolic dysfunction   Chronic atrial fibrillation with RVR   Cardiogenic shock (HCC)   Atrial fibrillation with RVR (HCC)   Elevated troponin   Acute on chronic combined systolic and diastolic CHF (congestive heart failure) (HCC)   Shock, septic - Patient presenting from home with sepsis physiology as well as apparent CHF with refractory afib - Cardiology appreciate assistance  Sepsis - SIRS criteria in this patient includes: Leukocytosis, fever, tachycardia, tachypnea, hypoxia  - Patient has evidence of acute organ failure with elevated lactate and hypotension - COVID negative - influenza A/B, resp viral panel negative - MRSA nasal swab negative - Bld CxNTD - Sepsis protocol initiated - Patient had initial lactate >4 or SBP <90/MAP <65 and so has received the 30 cc/kg IVF bolus.  - CXR was read as normal, although there are diffuse opacities that could be related to fluid overload or underlying infiltrates, or could be chronic changes.  - Her UA is unremarkable.  - continue IV Cefepime/Vanc for undifferentiated sepsis -transition to lvq to complete 7-days Tx? Still with no source, but improvement on abx     - cefepime/vanc transitioned to PO lvq w/ end of 12/06/18     - stop solu-cortef; resume home prednisone  Afib with RVR with cardiogenic shock - Patient with refractory afib at admission - initially thought to be exacerbated by hypovolemia; she has been fluid recussitated - Her CXR was mildly concerning for volume overload and her BNP is markedly elevated -  IV amio and Po metoprolol - echo pending - she stopped taking her Xarelto due to "blotches" on her hands; she is also a fall risk and she chronic AC may not be her best option; have talked to her about this and she is aware of the risks - on lovenox tx-dose - she is questioning if she can have a watchman device; asked cards about this, not appropriate in her - metoprolol 25m BID transitioned to coreg 6.226mBID     - IV amio transitioned to PO amio 40079mID     - have spoken again about the need for long term AC vs her fall risks; she is not in favor of resuming AC at this time     - appreciate cards assistance  Chronic diastolic dysfunction - Echo in 11/19 with preserved EF, normal diastolic function, and mild pulmonary HTN - Echo: LVEF 25-30%, mild LVH, severe global  hypokinesis, moderately reduced RV systolic function, severe LAE, moderate RAE, MAC with mild MR, mild to moderate TR, RVSP 37 mmHg + RAP     - coreg as above; ACEi?     - looks like cards may be continuing diuresis by IV; appreciate their assistance, will defer to them  HTN - BPs ok - coreg 6.34m BID  CKD3 - monitor SCr, UOP, watch nephrotoxins   Falls  - reports multiple falls, but says last was in November. Has not followed up on rehab program -PT recs HHPT, she agrees to this     - compression stockings   DVT prophylaxis: lovenox Code Status: DNR   [facility]   Consultants:   Cardiology   Antimicrobials:  . Levaquin    Subjective: "I feel good today"  Objective: Vitals:   12/06/18 0400 12/06/18 0500 12/06/18 0600 12/06/18 0800  BP:    (!) 160/90  Pulse: (!) 102 93 96 (!) 110  Resp: _0 Temp:    97.7 F (36.5 C)  TempSrc:    Oral  SpO2: 99% 98% 99% 99%  Weight:      Height:        Intake/Output Summary (Last 24 hours) at 12/06/2018 1327 Last data filed at 12/06/2018 0800 Gross per 24 hour  Intake 240 ml  Output 150 ml  Net 90 ml   Filed Weights   12/04/18 0500 12/05/18 0300 12/06/18 0300  Weight: 84.8 kg 83.7 kg 83.4 kg    Examination:  General:86 y.o.femaleresting in chair in NAD Cardiovascular:irregular irregular, +S1, S2, equal pulses throughout Respiratory:CTABL, normal WOB GI: BS+, NDNT, no masses noted, no organomegaly noted MSK: BLE edema, no c/c Neuro: A&O x 3, no focal deficits     Data Reviewed: I have personally reviewed following labs and imaging studies.  CBC: Recent Labs  Lab 12/01/18 0317  12/02/18 0407 12/03/18 0638 12/04/18 0500 12/04/18 0844 12/05/18 0530 12/06/18 0606  WBC 15.7*  --  12.6* 9.9 8.8  --  8.0 7.4  NEUTROABS 13.0*  --   --  8.3*  --   --   --  5.5  HGB 14.9   < > 12.8 11.8* 11.7*  --  11.0* 11.2*  HCT 44.5   < > 38.9 35.8* 36.2 39.2 33.4* 33.6*  MCV 104.0*  --  103.7* 102.9* 104.9*  --  103.7* 102.1*  PLT 206  --  158 169 163  --  156 164   < > = values in this interval not displayed.   Basic Metabolic Panel: Recent Labs  Lab 12/02/18 0407 12/03/18 [id number]/15/20 [id number]/16/20 0844 12/05/18 0500 12/05/18 0530 12/06/18 0606  NA 139 140  --  139 142  --  140  K 4.0 3.4*  --  4.0 3.7  --  3.2*   CL 108 108  --  103 105  --  105  CO2 22 25  --  24 26  --  27  GLUCOSE 134* 82  --  125* 106*  --  85  BUN 28* 29*  --  27* 34*  --  33*  CREATININE 0.91 0.92  --  0.89 1.03*  --  0.99  CALCIUM 8.2* 8.3*  --  8.8* 8.7*  --  8.4*  MG  --   --  1.9 2.0  --  2.0 1.9  PHOS  --  3.4  --  3.1 3.8  --  3.6  GFR: Estimated Creatinine Clearance: 47.9 mL/min (by C-G formula based on SCr of 0.99 mg/dL). Liver Function Tests: Recent Labs  Lab 12/03/18 9390 12/04/18 0844 12/05/18 0500 12/06/18 0606  ALBUMIN 2.5* 2.7* 2.3* 2.3*   No results for input(s): LIPASE, AMYLASE in the last 168 hours. No results for input(s): AMMONIA in the last 168 hours. Coagulation Profile: No results for input(s): INR, PROTIME in the last 168 hours. Cardiac Enzymes: Recent Labs  Lab 12/01/18 0317 12/01/18 0711 12/01/18 1245 12/01/18 1837  TROPONINI 0.54* 0.43* 0.32* 0.18*   BNP (last 3 results) No results for input(s): PROBNP in the last 8760 hours. HbA1C: No results for input(s): HGBA1C in the last 72 hours. CBG: Recent Labs  Lab 12/05/18 1229 12/05/18 1632 12/05/18 2101 12/06/18 0759 12/06/18 1146  GLUCAP 100* 83 135* 73 77   Lipid Profile: No results for input(s): CHOL, HDL, LDLCALC, TRIG, CHOLHDL, LDLDIRECT in the last 72 hours. Thyroid Function Tests: No results for input(s): TSH, T4TOTAL, FREET4, T3FREE, THYROIDAB in the last 72 hours. Anemia Panel: Recent Labs    12/04/18 0844  VITAMINB12 320   Sepsis Labs: Recent Labs  Lab 12/01/18 0711 12/01/18 1245 12/01/18 1837 12/02/18 0407 12/02/18 1946 12/02/18 2221 12/03/18 0633  PROCALCITON 6.46 7.32  --  5.78  --   --   --   LATICACIDVEN 2.6* 2.4* 2.9*  --  3.9* 2.9* 1.2    Recent Results (from the past 240 hour(s))  Urine culture     Status: None   Collection Time: 12/01/18  3:31 AM  Result Value Ref Range Status   Specimen Description URINE, RANDOM  Final   Special Requests NONE  Final   Culture   Final    NO GROWTH  Performed at [facility], [place] [address] [place], [place] [address]    Report Status 12/02/2018 FINAL  Final  Blood culture (routine x 2)     Status: None   Collection Time: 12/01/18  4:00 AM  Result Value Ref Range Status   Specimen Description BLOOD RIGHT WRIST  Final   Special Requests   Final    BOTTLES DRAWN AEROBIC ONLY Blood Culture adequate volume   Culture   Final    NO GROWTH 5 DAYS Performed at [facility], [place] [address], [place],  [address]    Report Status 12/06/2018 FINAL  Final  SARS Coronavirus 2 (CEPHEID- Performed in [place] hospital lab), Hosp Order     Status: None   Collection Time: 12/01/18  4:02 AM  Result Value Ref Range Status   SARS Coronavirus 2 NEGATIVE NEGATIVE Final    Comment: (NOTE) If result is NEGATIVE SARS-CoV-2 target nucleic acids are NOT DETECTED. The SARS-CoV-2 RNA is generally detectable in upper and lower  respiratory specimens during the acute phase of infection. The lowest  concentration of SARS-CoV-2 viral copies this assay can detect is 250  copies / mL. A negative result does not preclude SARS-CoV-2 infection  and should not be used as the sole basis for treatment or other  patient management decisions.  A negative result may occur with  improper specimen collection / handling, submission of specimen other  than nasopharyngeal swab, presence of viral mutation(s) within the  areas targeted by this assay, and inadequate number of viral copies  (<250 copies / mL). A negative result must be combined with clinical  observations, patient history, and epidemiological information. If result is POSITIVE SARS-CoV-2 target nucleic acids are DETECTED. The SARS-CoV-2  RNA is generally detectable in upper and lower  respiratory specimens dur ing the acute phase of infection.  Positive  results are indicative of active infection with SARS-CoV-2.  Clinical  correlation with patient history and other diagnostic information  is  necessary to determine patient infection status.  Positive results do  not rule out bacterial infection or co-infection with other viruses. If result is PRESUMPTIVE POSTIVE SARS-CoV-2 nucleic acids MAY BE PRESENT.   A presumptive positive result was obtained on the submitted specimen  and confirmed on repeat testing.  While 2019 novel coronavirus  (SARS-CoV-2) nucleic acids may be present in the submitted sample  additional confirmatory testing may be necessary for epidemiological  and / or clinical management purposes  to differentiate between  SARS-CoV-2 and other Sarbecovirus currently known to infect humans.  If clinically indicated additional testing with an alternate test  methodology [phone]) is advised. The SARS-CoV-2 RNA is generally  detectable in upper and lower respiratory sp ecimens during the acute  phase of infection. The expected result is Negative. Fact Sheet for Patients:  StrictlyIdeas.no Fact Sheet for Healthcare Providers: BankingDealers.co.za This test is not yet approved or cleared by the [place] FDA and has been authorized for detection and/or diagnosis of SARS-CoV-2 by FDA under an Emergency Use Authorization (EUA).  This EUA will remain in effect (meaning this test can be used) for the duration of the COVID-19 declaration under Section 564(b)(1) of the Act, 21 U.S.C. section 360bbb-3(b)(1), unless the authorization is terminated or revoked sooner. Performed at [facility], [place] [address] [place], [place] [address]   Respiratory Panel by PCR     Status: None   Collection Time: 12/01/18  4:02 AM  Result Value Ref Range Status   Adenovirus NOT DETECTED NOT DETECTED Final   Coronavirus 229E NOT DETECTED NOT DETECTED Final    Comment: (NOTE) The Coronavirus on the Respiratory Panel, DOES NOT test for the novel  Coronavirus (2019 nCoV)    Coronavirus HKU1 NOT DETECTED NOT DETECTED Final    Coronavirus NL63 NOT DETECTED NOT DETECTED Final   Coronavirus OC43 NOT DETECTED NOT DETECTED Final   Metapneumovirus NOT DETECTED NOT DETECTED Final   Rhinovirus / Enterovirus NOT DETECTED NOT DETECTED Final   Influenza A NOT DETECTED NOT DETECTED Final   Influenza B NOT DETECTED NOT DETECTED Final   Parainfluenza Virus 1 NOT DETECTED NOT DETECTED Final   Parainfluenza Virus 2 NOT DETECTED NOT DETECTED Final   Parainfluenza Virus 3 NOT DETECTED NOT DETECTED Final   Parainfluenza Virus 4 NOT DETECTED NOT DETECTED Final   Respiratory Syncytial Virus NOT DETECTED NOT DETECTED Final   Bordetella pertussis NOT DETECTED NOT DETECTED Final   Chlamydophila pneumoniae NOT DETECTED NOT DETECTED Final   Mycoplasma pneumoniae NOT DETECTED NOT DETECTED Final    Comment: Performed at [facility], [place] [place], [place] [address]  Blood culture (routine x 2)     Status: None   Collection Time: 12/01/18  4:22 AM  Result Value Ref Range Status   Specimen Description BLOOD LEFT WRIST  Final   Special Requests   Final    BOTTLES DRAWN AEROBIC AND ANAEROBIC Blood Culture adequate volume   Culture   Final    NO GROWTH 5 DAYS Performed at [facility], [address], [place],  [address]    Report Status 12/06/2018 FINAL  Final  MRSA PCR Screening     Status: None   Collection Time:  12/01/18  4:43 PM  Result Value Ref Range Status   MRSA by PCR NEGATIVE NEGATIVE Final    Comment:        The GeneXpert MRSA Assay (FDA approved for NASAL specimens only), is one component of a comprehensive MRSA colonization surveillance program. It is not intended to diagnose MRSA infection nor to guide or monitor treatment for MRSA infections. Performed at [facility], [place] [address], [place], [place] [address]          Radiology Studies: No results found.      Scheduled Meds: . amiodarone  400 mg Oral Daily  . carvedilol  6.25 mg Oral BID WC  . docusate sodium   100 mg Oral BID  . enoxaparin (LOVENOX) injection  70 mg Subcutaneous Q12H  . latanoprost  1 drop Both Eyes QHS  . levofloxacin  750 mg Oral Daily  . mupirocin ointment   Topical BID  . potassium chloride  40 mEq Oral Q4H  . predniSONE  10 mg Oral Q breakfast  . sodium chloride flush  3 mL Intravenous Q12H   Continuous Infusions:   LOS: 5 days    Time spent: 35 minutes spent in the coordination of care today.    Jonnie Finner, DO [facility] Pager [phone]  If 7PM-7AM, please contact night-coverage [email or web address] Password [email or web address] 12/06/2018, 1:27 PM

## 2018-12-06 NOTE — Progress Notes (Signed)
ANTICOAGULATION CONSULT NOTE - Initial Consult  Pharmacy Consult for Lovenox Indication: atrial fibrillation  Allergies  Allergen Reactions  . Cardizem [Diltiazem Hcl] Swelling  . Clonidine Derivatives Swelling  . Doxazosin Swelling    Patient Measurements: Height: 5\' 8"  (172.7 cm) Weight: 183 lb 13.8 oz (83.4 kg) IBW/kg (Calculated) : 63.9  Vital Signs: Temp: 97.7 F (36.5 C) (05/18 0800) Temp Source: Oral (05/18 0800) BP: 160/90 (05/18 0800) Pulse Rate: 110 (05/18 0800)  Labs: Recent Labs    12/04/18 0500 12/04/18 0844 12/05/18 0500 12/05/18 0530 12/06/18 0606  HGB 11.7*  --   --  11.0* 11.2*  HCT 36.2  --   --  33.4* 33.6*  PLT 163  --   --  156 164  CREATININE  --  0.89 1.03*  --  0.99    Estimated Creatinine Clearance: 47.9 mL/min (by C-G formula based on SCr of 0.99 mg/dL).   Medical History: Past Medical History:  Diagnosis Date  . Arthritis    DJD  . Cancer ([place])    basal cell CA  . Chronic anticoagulation    on Xarelto  . Chronic atrial fibrillation    s/p failed DCCV now on rate control  . Diastolic dysfunction    by echo 07/2012  . Glaucoma   . Hyperlipidemia   . Hypertension   . Pulmonary HTN (HCC)    mild - RVSP 40-72mmHg  . Vocal cord paralysis     Medications:  Scheduled:  . amiodarone  400 mg Oral Daily  . carvedilol  6.25 mg Oral BID WC  . docusate sodium  100 mg Oral BID  . enoxaparin (LOVENOX) injection  70 mg Subcutaneous Q12H  . hydrocortisone sod succinate (SOLU-CORTEF) inj  50 mg Intravenous Q12H  . latanoprost  1 drop Both Eyes QHS  . levofloxacin  750 mg Oral Daily  . mupirocin ointment   Topical BID  . potassium chloride  40 mEq Oral Q4H  . sodium chloride flush  3 mL Intravenous Q12H   Infusions:    Assessment: 85 yo F on heparin for afib with RVR.  Pt lost IV access ~1130 and was unable to obtain additional IV access or PICC.  Pharmacy asked to start Lovenox for treatment dose anticoagulation.  Goal of  Therapy:  Anti-Xa level 0.6-1 units/ml 4hrs after LMWH dose given Monitor platelets by anticoagulation protocol: Yes   Plan:  Continue Lovenox 70mg  SQ q12h Monitor CBC, sx of bleeding Follow-up plans for oral anticoagulation prior to discharge  Loghan Kurtzman A. Levada Dy, PharmD, [place] Pager: [phone] Please utilize Amion for appropriate phone number to reach the unit pharmacist ([place])    12/06/2018 12:06 PM

## 2018-12-06 NOTE — Progress Notes (Signed)
Physical Therapy Treatment Patient Details Name: Kelsey [name] MRN: [id number] DOB: 1932-06-12 Today's Date: 12/06/2018    History of Present Illness Patient is an 85 y/o female presenting to the ED on 12/01/2018 with primary complaints of fever. Past medical history significant of pulmonary HTN; HTN; HLD; vocal cord paralysis;  Diastolic dysfunction; and afib previously on Xarelto. Admitted for Shock, likely mixed septic and cardiogenic work-up.    PT Comments    Patient received up in chair and willing to participate with therapy. Encouraged patient to ambulate a greater distance today as well as PT placing BSC over toilet to encourage short trips throughout her day. Min A to rise from recliner with good awareness of hand positioning. Ambulating on RA with SpO2 87-89%. Replaced supplemental O2 at rest with good return to >90%. Will continue to follow.     Follow Up Recommendations  Home health PT;Supervision/Assistance - 24 hour     Equipment Recommendations  None recommended by PT    Recommendations for Other Services       Precautions / Restrictions Precautions Precautions: Fall Restrictions Weight Bearing Restrictions: No    Mobility  Bed Mobility               General bed mobility comments: up in recliner  Transfers Overall transfer level: Needs assistance Equipment used: Rolling walker (2 wheeled) Transfers: Sit to/from Stand Sit to Stand: Min assist         General transfer comment: MIn A to power up from recliner  Ambulation/Gait Ambulation/Gait assistance: Min guard Gait Distance (Feet): 140 Feet Assistive device: Rolling walker (2 wheeled) Gait Pattern/deviations: Step-through pattern;Decreased stride length Gait velocity: decreased   General Gait Details: slow pace of gait; encouragement to ambulate a greater distance; SpO2 on RA 87-89% with mobility   Stairs             Wheelchair Mobility    Modified Rankin (Stroke  Patients Only)       Balance Overall balance assessment: Needs assistance Sitting-balance support: No upper extremity supported;Feet supported Sitting balance-Leahy Scale: Fair     Standing balance support: Bilateral upper extremity supported;During functional activity Standing balance-Leahy Scale: Poor Standing balance comment: states she feels like she has to use RW - encouraged this at home                            Cognition Arousal/Alertness: Awake/alert Behavior During Therapy: WFL for tasks assessed/performed Overall Cognitive Status: Within Functional Limits for tasks assessed                                        Exercises      General Comments General comments (skin integrity, edema, etc.): placed BSC over toilet to encourage patient ambulate short distances throughout the day - communicated this to nursing staff      Pertinent Vitals/Pain Pain Assessment: No/denies pain    Home Living                      Prior Function            PT Goals (current goals can now be found in the care plan section) Acute Rehab PT Goals Patient Stated Goal: return home soon PT Goal Formulation: With patient Time For Goal Achievement: 12/18/18 Potential to Achieve Goals: Good Progress towards PT goals:  Progressing toward goals    Frequency    Min 3X/week      PT Plan Current plan remains appropriate    Co-evaluation              AM-PAC PT "6 Clicks" Mobility   Outcome Measure  Help needed turning from your back to your side while in a flat bed without using bedrails?: A Little Help needed moving from lying on your back to sitting on the side of a flat bed without using bedrails?: A Little Help needed moving to and from a bed to a chair (including a wheelchair)?: A Little Help needed standing up from a chair using your arms (e.g., wheelchair or bedside chair)?: A Little Help needed to walk in hospital room?: A  Little Help needed climbing 3-5 steps with a railing? : A Lot 6 Click Score: 17    End of Session Equipment Utilized During Treatment: Gait belt Activity Tolerance: Patient tolerated treatment well Patient left: in chair;with call bell/phone within reach Nurse Communication: Mobility status PT Visit Diagnosis: Unsteadiness on feet (R26.81);Other abnormalities of gait and mobility (R26.89);Muscle weakness (generalized) (M62.81)     Time: 0981-1914 PT Time Calculation (min) (ACUTE ONLY): 25 min  Charges:  $Gait Training: 8-22 mins $Therapeutic Activity: 8-22 mins                     Lanney Gins, PT, DPT Supplemental Physical Therapist 12/06/18 10:25 AM Pager: [phone] Office: [phone]

## 2018-12-06 NOTE — TOC Transition Note (Addendum)
Transition of Care [facility]) - CM/SW Discharge Note   Patient Details  Name: Kelsey [name] MRN: [id number] Date of Birth: Aug 02, 1931  [facility]) CM/SW Contact:  Zenon Mayo, RN Phone Number: 12/06/2018, 9:57 AM   Clinical Narrative:    From home with spouse, she has walker and BSC at home, she states she does not need any DME, she has no problem getting her medications. She has PCP.  NCM offered choice for HHPT from Medicare.gov list she chose [facility].  Referral given to [facility] with [facility] , [place] call back to see if she can take referral. Butch Penny states they can take referral.    Final next level of care: Home w Home Health Services Barriers to Discharge: No Barriers Identified   Patient Goals and CMS Choice Patient states their goals for this hospitalization and ongoing recovery are:: get better  CMS Medicare.gov Compare Post Acute Care list provided to:: Patient Choice offered to / list presented to : Patient  Discharge Placement                       Discharge Plan and Services   Discharge Planning Services: CM Consult, Follow-up appt scheduled Post Acute Care Choice: Home Health          DME Arranged: N/A DME Agency: NA       HH Arranged: PT [place] Agency: [place] ([facility]) Date HH Agency Contacted: 12/06/18 Time [place]: [phone] Representative spoke with at [place]: [place] ([place]) Interventions     Readmission Risk Interventions Readmission Risk Prevention Plan 12/06/2018  Transportation Screening Complete  PCP or Specialist Appt within 3-5 Days Complete  HRI or [place] Complete  Social Work Consult for [place] Planning/Counseling Complete  Palliative Care Screening Not Applicable  Medication Review Press photographer) Complete  Some recent data might be hidden

## 2018-12-06 NOTE — Care Management Important Message (Signed)
Important Message  Patient Details  Name: Kelsey [name] MRN: [id number] Date of Birth: 1932-03-14   Medicare Important Message Given:  Yes    Oveta Idris [address] 12/06/2018, 2:55 PM

## 2018-12-06 NOTE — Progress Notes (Addendum)
The patient has been seen in conjunction with Roby Lofts PAC. All aspects of care have been considered and discussed. The patient has been personally interviewed, examined, and all clinical data has been reviewed.  Note is been amended.   A. fib with RVR secondary to stress of sepsis and withdrawal of beta-blocker.  Highly likely that LV systolic dysfunction is related to rate and myocardial depressant effect of sepsis.  We will plan to switch metoprolol to carvedilol.  Consider Entresto.  Guideline directed therapy for systolic dysfunction will be dependent upon hemodynamics.  We will monitor clinical status and make adjustments as required.  Elevated troponin is felt to represent type II myocardial injury.    Progress Note  Patient Name: Kelsey [name] Date of Encounter: 12/06/2018  Primary Cardiologist: Mahala Menghini, MD   Subjective   She seems to be improving.  Vital signs are better.  Heart rate is still not optimally controlled.  Inpatient Medications    Scheduled Meds: . amiodarone  400 mg Oral Daily  . carvedilol  6.25 mg Oral BID WC  . docusate sodium  100 mg Oral BID  . enoxaparin (LOVENOX) injection  70 mg Subcutaneous Q12H  . hydrocortisone sod succinate (SOLU-CORTEF) inj  50 mg Intravenous Q12H  . latanoprost  1 drop Both Eyes QHS  . levofloxacin  750 mg Oral Daily  . mupirocin ointment   Topical BID  . sodium chloride flush  3 mL Intravenous Q12H   Continuous Infusions:  PRN Meds: acetaminophen **OR** acetaminophen, diphenhydrAMINE, HYDROcodone-acetaminophen, lidocaine, ondansetron **OR** ondansetron (ZOFRAN) IV, polyethylene glycol, polyvinyl alcohol   Vital Signs    Vitals:   12/06/18 0400 12/06/18 0500 12/06/18 0600 12/06/18 0800  BP:    (!) 160/90  Pulse: (!) 102 93 96 (!) 110  Resp: _0 Temp:    97.7 F (36.5 C)  TempSrc:    Oral  SpO2: 99% 98% 99% 99%  Weight:      Height:        Intake/Output Summary (Last 24 hours) at  12/06/2018 1055 Last data filed at 12/06/2018 0800 Gross per 24 hour  Intake 240 ml  Output 450 ml  Net -210 ml   Filed Weights   12/04/18 0500 12/05/18 0300 12/06/18 0300  Weight: 84.8 kg 83.7 kg 83.4 kg    Telemetry    A. fib with rapid ventricular rate generally less than 120 bpm- Personally Reviewed   Physical Exam   Physical exam per MD: Elderly and frail. No significant JVD.   Faint basilar crackles. No peripheral edema. Awake, alert, with slight decrease in memory  Labs    Chemistry Recent Labs  Lab 12/04/18 0844 12/05/18 0500 12/06/18 0606  NA 139 142 140  K 4.0 3.7 3.2*  CL 103 105 105  CO2 _1 GLUCOSE 125* 106* 85  BUN 27* 34* 33*  CREATININE 0.89 1.03* 0.99  CALCIUM 8.8* 8.7* 8.4*  ALBUMIN 2.7* 2.3* 2.3*  GFRNONAA 60* 50* 52*  GFRAA >60 58* >60  ANIONGAP _2 Hematology Recent Labs  Lab 12/04/18 0500 12/05/18 0530 12/06/18 0606  WBC 8.8 8.0 7.4  RBC 3.45* 3.22* 3.29*  HGB 11.7* 11.0* 11.2*  HCT 36.2 33.4* 33.6*  MCV 104.9* 103.7* 102.1*  MCH 33.9 34.2* 34.0  MCHC 32.3 32.9 33.3  RDW 14.3 13.7 13.9  PLT 163 156 164    Cardiac Enzymes Recent Labs  Lab 12/01/18 0317 12/01/18 0711  12/01/18 1245 12/01/18 1837  TROPONINI 0.54* 0.43* 0.32* 0.18*   No results for input(s): TROPIPOC in the last 168 hours.   BNP Recent Labs  Lab 12/01/18 0317  BNP 1,554.3*     DDimer No results for input(s): DDIMER in the last 168 hours.   Radiology    No results found.  Cardiac Studies   Echocardiogram 12/02/2018: IMPRESSIONS    1. The left ventricle has severely reduced systolic function, with an ejection fraction of 25-30%. The cavity size was normal. There is mildly increased left ventricular wall thickness. Left ventricular diastolic function could not be evaluated  secondary to atrial fibrillation. Left ventricular diffuse hypokinesis.  2. The right ventricle has moderately reduced systolic function. The cavity was  mildly enlarged. There is no increase in right ventricular wall thickness.  3. Left atrial size was severely dilated.  4. Right atrial size was moderately dilated.  5. The mitral valve is abnormal. Mild thickening of the mitral valve leaflet. There is mild to moderate mitral annular calcification present.  6. The tricuspid valve was abnormal. Tricuspid valve regurgitation is mild-moderate.  7. The aortic valve is abnormal Mild calcification of the aortic valve. No stenosis of the aortic valve.  8. The aortic root and ascending aorta are normal in size and structure.  9. The interatrial septum was not well visualized.  SUMMARY   LVEF 25-30%, mild LVH, severe global hypokinesis, moderately reducedvRV systolic function, severe LAE, moderate RAE, MAC with mild MR, mild to moderate TR, RVSP 37 mmHg + RAP  Patient Profile     28 yr femalewith a PMH of HTN, HLD, pulmonary HTN, atrial fibrillation not on anticoagulation, vocal cord paralysis, and chronic prednisone use for unknown reason,whois being followed by cardiology foratrial fibrillation with RVR and acute combined CHF.  Assessment & Plan    1. Acute combined CHF: echo this admission revealed EF 25-30%, global hypokinesis, and moderate RV systolic dysfunction with severe biatrial enlargement. She is diuresing with IV lasix with UOP net -554m in the past 24 hrs, remaining net +4.8L this admission after aggressive fluid resuscitation for sepsis. Weight is down 1lb from yesterday, though still up 2lbs from admission.  - Continue IV lasix 40 mg  Iv BID - Continue to monitor strict I/O's and daily weights - monitor electrolytes to maintain K >4, Mg >2; will replete K 80 mEq in divided doses today.  - Continue carvedilol - Consider entresto if Cr/BP stable after diuresis  - Recheck BNP  2. Atrial fibrillation with RVR: still with intermittent RVR.  - Continue amiodarone for rate control and consider resuming betablocker as before once BP  stable. At that time amiodarone could be stopped. - Continue carvedilol  better.  Since LV systolic dysfunction was present.  Metoprolol may be better at heart rate control.  Much of increased heart rate likely related to superimposed sepsis and beta-blocker withdrawal.  - Continue therapeutic lovenox for stroke ppx  3. Sepsis: unclear souce, however CXR with possible PNA. Viral panel, influenza A/B, COVID-19, BCx, and UA all negative. Completes 7 day course of antibiotics today.  - Continue management per primary team   For questions or updates, please contact CPrayPlease consult [email or web address] for contact info under Cardiology/STEMI.      Signed, KAbigail Butts PA-C  12/06/2018, 10:55 AM   [phone]

## 2018-12-07 ENCOUNTER — Inpatient Hospital Stay (HOSPITAL_COMMUNITY): Payer: Medicare HMO

## 2018-12-07 DIAGNOSIS — I5021 Acute systolic (congestive) heart failure: Secondary | ICD-10-CM

## 2018-12-07 LAB — CBC
HCT: 35.2 % — ABNORMAL LOW (ref 36.0–46.0)
HCT: 33.7 % — ABNORMAL LOW (ref 36.0–46.0)
HCT: 29.2 % — ABNORMAL LOW (ref 36.0–46.0)
Hemoglobin: 11.6 g/dL — ABNORMAL LOW (ref 12.0–15.0)
Hemoglobin: 11.2 g/dL — ABNORMAL LOW (ref 12.0–15.0)
Hemoglobin: 9.7 g/dL — ABNORMAL LOW (ref 12.0–15.0)
MCH: 33.9 pg (ref 26.0–34.0)
MCH: 34.3 pg — ABNORMAL HIGH (ref 26.0–34.0)
MCH: 34.3 pg — ABNORMAL HIGH (ref 26.0–34.0)
MCHC: 33 g/dL (ref 30.0–36.0)
MCHC: 33.2 g/dL (ref 30.0–36.0)
MCHC: 33.2 g/dL (ref 30.0–36.0)
MCV: 102.9 fL — ABNORMAL HIGH (ref 80.0–100.0)
MCV: 103.1 fL — ABNORMAL HIGH (ref 80.0–100.0)
MCV: 103.2 fL — ABNORMAL HIGH (ref 80.0–100.0)
Platelets: 179 10*3/uL (ref 150–400)
Platelets: 196 10*3/uL (ref 150–400)
Platelets: 178 10*3/uL (ref 150–400)
RBC: 3.42 MIL/uL — ABNORMAL LOW (ref 3.87–5.11)
RBC: 3.27 MIL/uL — ABNORMAL LOW (ref 3.87–5.11)
RBC: 2.83 MIL/uL — ABNORMAL LOW (ref 3.87–5.11)
RDW: 14.1 % (ref 11.5–15.5)
RDW: 13.9 % (ref 11.5–15.5)
RDW: 14 % (ref 11.5–15.5)
WBC: 8 10*3/uL (ref 4.0–10.5)
WBC: 8.6 10*3/uL (ref 4.0–10.5)
WBC: 7.3 10*3/uL (ref 4.0–10.5)
nRBC: 0 % (ref 0.0–0.2)
nRBC: 0 % (ref 0.0–0.2)
nRBC: 0 % (ref 0.0–0.2)

## 2018-12-07 LAB — GLUCOSE, CAPILLARY
Glucose-Capillary: 56 mg/dL — ABNORMAL LOW (ref 70–99)
Glucose-Capillary: 40 mg/dL — CL (ref 70–99)
Glucose-Capillary: 178 mg/dL — ABNORMAL HIGH (ref 70–99)
Glucose-Capillary: 99 mg/dL (ref 70–99)
Glucose-Capillary: 143 mg/dL — ABNORMAL HIGH (ref 70–99)
Glucose-Capillary: 121 mg/dL — ABNORMAL HIGH (ref 70–99)
Glucose-Capillary: 87 mg/dL (ref 70–99)

## 2018-12-07 LAB — RENAL FUNCTION PANEL
Albumin: 2.5 g/dL — ABNORMAL LOW (ref 3.5–5.0)
Anion gap: 9 (ref 5–15)
BUN: 31 mg/dL — ABNORMAL HIGH (ref 8–23)
CO2: 27 mmol/L (ref 22–32)
Calcium: 8.6 mg/dL — ABNORMAL LOW (ref 8.9–10.3)
Chloride: 106 mmol/L (ref 98–111)
Creatinine, Ser: 0.77 mg/dL (ref 0.44–1.00)
GFR calc Af Amer: >60 mL/min (ref >60)
GFR calc non Af Amer: >60 mL/min (ref >60)
Glucose, Bld: 80 mg/dL (ref 70–99)
Phosphorus: 3.1 mg/dL (ref 2.5–4.6)
Potassium: 3.6 mmol/L (ref 3.5–5.1)
Sodium: 142 mmol/L (ref 135–145)

## 2018-12-07 LAB — MAGNESIUM: Magnesium: 1.9 mg/dL (ref 1.7–2.4)

## 2018-12-07 MED ORDER — DEXTROSE 50 % IV SOLN
1.0000 | Freq: Once | INTRAVENOUS | Status: AC
  Administered 2018-12-07 (×2): 50 mL via INTRAVENOUS

## 2018-12-07 MED ORDER — AMIODARONE HCL 200 MG PO TABS
200.0000 mg | ORAL_TABLET | Freq: Every day | ORAL | Status: DC
  Administered 2018-12-08 – 2018-12-10 (×3): 200 mg via ORAL
  Filled 2018-12-07 (×3): qty 1

## 2018-12-07 MED ORDER — DEXTROSE 50 % IV SOLN: 1.0000 | INTRAVENOUS | Status: DC | PRN

## 2018-12-07 MED ORDER — DEXTROSE 50 % IV SOLN
INTRAVENOUS | Status: AC
  Administered 2018-12-07: 50 mL via INTRAVENOUS
  Filled 2018-12-07: qty 50

## 2018-12-07 MED ORDER — CARVEDILOL 25 MG PO TABS
25.0000 mg | ORAL_TABLET | Freq: Two times a day (BID) | ORAL | Status: DC
  Administered 2018-12-07 – 2018-12-10 (×6): 25 mg via ORAL
  Filled 2018-12-07 (×7): qty 1

## 2018-12-07 MED ORDER — IOHEXOL 350 MG/ML SOLN
100.0000 mL | Freq: Once | INTRAVENOUS | Status: AC | PRN
  Administered 2018-12-07: 100 mL via INTRAVENOUS

## 2018-12-07 NOTE — Progress Notes (Signed)
Progress Note  Patient Name: Kelsey [name] Date of Encounter: 12/07/2018  Primary Cardiologist: Mahala Menghini, MD   Subjective   Asleep in chair, slightly confused when aroused.  No dyspnea.  Inpatient Medications    Scheduled Meds: . amiodarone  400 mg Oral Daily  . carvedilol  12.5 mg Oral BID WC  . docusate sodium  100 mg Oral BID  . furosemide  40 mg Intravenous BID  . latanoprost  1 drop Both Eyes QHS  . mupirocin ointment   Topical BID  . predniSONE  10 mg Oral Q breakfast  . sodium chloride flush  3 mL Intravenous Q12H   Continuous Infusions:  PRN Meds: acetaminophen **OR** acetaminophen, diphenhydrAMINE, HYDROcodone-acetaminophen, lidocaine, ondansetron **OR** ondansetron (ZOFRAN) IV, polyethylene glycol, polyvinyl alcohol   Vital Signs    Vitals:   12/07/18 0737 12/07/18 0828 12/07/18 0830 12/07/18 0831  BP: (!) 141/82     Pulse: 99     Resp:      Temp: 97.6 F (36.4 C)     TempSrc: Oral     SpO2: 94% (!) 83% (!) 88% 91%  Weight:      Height:        Intake/Output Summary (Last 24 hours) at 12/07/2018 1425 Last data filed at 12/07/2018 0930 Gross per 24 hour  Intake -  Output 650 ml  Net -650 ml   Last 3 Weights 12/07/2018 12/06/2018 12/05/2018  Weight (lbs) 182 lb 1.6 oz 183 lb 13.8 oz 184 lb 8.4 oz  Weight (kg) 82.6 kg 83.4 kg 83.7 kg      Telemetry    Atrial fib with moderate rate control- Personally Reviewed  ECG    No new tracing- Personally Reviewed  Physical Exam  Elderly GEN:  Somewhat confused when awakened Neck: No JVD Cardiac: IIRR, no murmurs, rubs, or gallops.  Respiratory: Clear to auscultation bilaterally. GI: Soft, nontender, non-distended  MS: No edema; No deformity. Neuro:  Nonfocal  Psych: Normal affect   Labs    Chemistry Recent Labs  Lab 12/05/18 0500 12/06/18 0606 12/07/18 0605  NA 142 140 142  K 3.7 3.2* 3.6  CL 105 105 106  CO2 26 27 27   GLUCOSE 106* 85 80  BUN 34* 33* 31*  CREATININE 1.03*  0.99 0.77  CALCIUM 8.7* 8.4* 8.6*  ALBUMIN 2.3* 2.3* 2.5*  GFRNONAA 50* 52* >60  GFRAA 58* >60 >60  ANIONGAP 11 8 9      Hematology Recent Labs  Lab 12/05/18 0530 12/06/18 0606 12/07/18 0605  WBC 8.0 7.4 8.0  RBC 3.22* 3.29* 3.42*  HGB 11.0* 11.2* 11.6*  HCT 33.4* 33.6* 35.2*  MCV 103.7* 102.1* 102.9*  MCH 34.2* 34.0 33.9  MCHC 32.9 33.3 33.0  RDW 13.7 13.9 14.1  PLT 156 164 179    Cardiac Enzymes Recent Labs  Lab 12/01/18 0317 12/01/18 0711 12/01/18 1245 12/01/18 1837  TROPONINI 0.54* 0.43* 0.32* 0.18*   No results for input(s): TROPIPOC in the last 168 hours.   BNP Recent Labs  Lab 12/01/18 0317  BNP 1,554.3*     DDimer No results for input(s): DDIMER in the last 168 hours.   Radiology    No results found.  Cardiac Studies   Global reduction in LV function by echo 12/02/2018 with EF 25%  Patient Profile     85 y.o. female a PMH of HTN, HLD, pulmonary HTN, atrial fibrillation not on anticoagulation, vocal cord paralysis, and chronic prednisone use for unknown reason,whois being followed by  cardiology foratrial fibrillation with RVR and acute combined CHF.  Assessment & Plan   1. Acute systolic heart failure, uncertain etiology but possibly related to myocardial depression from sepsis plus or minus component of tachycardia induced systolic dysfunction.  If this is the case, LV function should return to normal relatively quickly.  Last known LVEF outside our system was 50 to 55% in 2016 at [facility].  BP and kidney function allow, ARB/ACE/Arni may be a consideration.  Repeat LVEF in 2 to 3 months. 2. Known chronic atrial fibrillation with rate control on beta-blocker therapy with recent discontinuation of amiodarone because of side effects.  And to further uptitrate carvedilol for better rate control and hope to decrease/DC amiodarone within the next several days to a week.      For questions or updates, please contact  [place] Please consult [email or web address] for contact info under        Signed, Sinclair Grooms, MD  12/07/2018, 2:25 PM

## 2018-12-07 NOTE — Progress Notes (Signed)
Patient c/o that the ted hose are too tight. Pt removed ted hose per self.

## 2018-12-07 NOTE — Progress Notes (Addendum)
Called by radiology concerning [place] of RLE showing hematoma. US shows active bleeding. Unable to determine if arterial or venous. Currently pt VS are stable and last H/H done at 2000 is stable and consistent with previous H/H. Will order a stat CTA of RLE to determine source of bleeding. Serial H/H ordered. Will call surgery if hematoma worsens and pt becomes symptomatic.   Lovey Newcomer, NP [facility] [phone]  [phone]  Addendum: Received call back from radiology regarding CTA. No active bleeding at this time.  A large hematoma noted at this time. Will continue to monitor VS, pedal pulses and CBC for now.

## 2018-12-07 NOTE — Progress Notes (Signed)
MD paged:Pt has blister on rle-that she wants you come assess and possibly ind

## 2018-12-07 NOTE — Progress Notes (Signed)
MD paged:Pt gbs below 70. Running 40-50's. Request for hypoglycemic orders.

## 2018-12-07 NOTE — Progress Notes (Signed)
Pt c/o pain that is beginning to increase after tylenol administration. Pt states efficacy of tylenol wearing off. MD paged. Per Dr. Marylyn Ishihara ok to give the prn vicodin at this time.

## 2018-12-07 NOTE — Progress Notes (Signed)
Spoke with Doctor r/t hypoglycemia to verify any additional parameters requested for verbal order D50-no additional. Updated on pt wait status for Ultrasound-no additional info needed at this time.

## 2018-12-07 NOTE — Progress Notes (Signed)
Marland Kitchen  PROGRESS NOTE    Kelsey [name]  ZTI:[id number] DOB: 09/11/1931 DOA: 12/01/2018 PCP: [name], MD   Brief Narrative:   Kelsey Blecha Robinette-Hagieis a 85 y.o.femalewith medical history significant ofpulmonary HTN; HTN; HLD; vocal cord paralysis; Diastolic dysfunction; and afib previously on Xarelto presenting with myalgias. I spoke with her husband who reports that she woke up from a nap, she just didn't feel good and she kept getting worse. She wasn't able to move. She was somewhat SOB. No cough or respiratory. No urinary complaints. No n/v/d. She was a little subjectively warm. She has been off Xarelto "for a pretty good while"; she had "blood splotches on her hands" that they attributed to the Xarelto and so she stopped taking it - he thinks this was months ago. The patient describes generalized myalgias preventing her from getting out of bed. She has spinal stenosis and is normally sedentary but is able to move with a walker.   Assessment & Plan:   Principal Problem:   Severe sepsis ([place]) Active Problems:   Hypertension   Diastolic dysfunction   Chronic atrial fibrillation with RVR   Cardiogenic shock (HCC)   Atrial fibrillation with RVR (HCC)   Elevated troponin   Acute on chronic combined systolic and diastolic CHF (congestive heart failure) (HCC)   Shock, septic - Patient presenting from home with sepsis physiology as well as apparent CHF with refractory afib - Cardiology appreciate assistance  Sepsis - SIRS criteria in this patient includes: Leukocytosis, fever, tachycardia, tachypnea, hypoxia  - Patient has evidence of acute organ failure with elevated lactate and hypotension - COVID negative - influenza A/B, resp viral panel negative - MRSA nasal swab negative - Bld CxNTD - Sepsis protocol initiated - Patient had initial lactate >4 or SBP <90/MAP <65 and so has received the 30 cc/kg IVF bolus.  - CXR was read as normal, although there are diffuse opacities that could be related to fluid overload or underlying infiltrates, or could be chronic changes.  - Her UA is unremarkable.  - continue IV Cefepime/Vanc for undifferentiated sepsis -transition to lvq to complete 7-days Tx? Still with no source, but improvement on abx - cefepime/vanc transitioned to PO lvq w/ end of 12/06/18 - home prednisone  Afib with RVR with cardiogenic shock - Patient with refractory afib at admission - initially thought to be exacerbated by hypovolemia; she has been fluid recussitated - Her CXR was mildly concerning for volume overload and her BNP is markedly elevated - IV amio and Po metoprolol - echo pending - she stopped taking her Xarelto due to "blotches" on her hands; she is also a fall risk and she chronic AC may not be her best option; have talked to her about this and she is aware of the risks - on lovenox tx-dose - she is questioning if she can have a watchman device; asked cards about this, not appropriate in her - metoprolol 40m BID transitioned to coreg 6.248mBID - IV amio transitioned to PO amio 40050mID - have spoken again about the need for long term AC vs her fall risks; she is not in favor of resuming AC at this time - appreciate cards assistance  Chronic diastolic dysfunction - Echo in 11/19 with preserved EF, normal diastolic function, and mild pulmonary HTN - Echo:LVEF 25-30%, mild LVH, severe global hypokinesis, moderately reduced RV systolic function, severe LAE, moderate RAE, MAC with mild MR, mild to moderate TR, RVSP 37 mmHg + RAP - coreg  as above; ACEi?     - looks like cards may be continuing diuresis by IV; appreciate their assistance, will defer to them  HTN - BPs ok -coreg 6.63m BID  CKD3 - monitor SCr, UOP, watch nephrotoxins   Falls - reports multiple  falls, but says last was in November. Has not followed up on rehab program -PTrecs HHPT, she agrees to this - compression stockings  Right calf hematoma     - denies fall; says it just showed up shortly after our initial interview this AM     - UKorearight calf ordered to assess     - hold lovenox d/t this hematoma     - CBC this evening   DVT prophylaxis: held d/t bleed Code Status: DNR   [facility]   Consultants:   Cardiology     Subjective: "Look! It hurts!"  Objective: Vitals:   12/07/18 0737 12/07/18 0828 12/07/18 0830 12/07/18 0831  BP: (!) 141/82     Pulse: 99     Resp:      Temp: 97.6 F (36.4 C)     TempSrc: Oral     SpO2: 94% (!) 83% (!) 88% 91%  Weight:      Height:        Intake/Output Summary (Last 24 hours) at 12/07/2018 1325 Last data filed at 12/07/2018 0930 Gross per 24 hour  Intake -  Output 650 ml  Net -650 ml   Filed Weights   12/05/18 0300 12/06/18 0300 12/07/18 0600  Weight: 83.7 kg 83.4 kg 82.6 kg    Examination:  General:86 y.o.femaleresting in chair in NAD Cardiovascular:irregular irregular, +S1, S2, equal pulses throughout Respiratory:CTABL, normal WOB GI: BS+, NDNT, no masses noted, no organomegaly noted MSK:BLE edema, no c/c, right calf hematoma Neuro: A&O x 3, no focal deficits    Data Reviewed: I have personally reviewed following labs and imaging studies.  CBC: Recent Labs  Lab 12/01/18 0317  12/03/18 [id number]/20 0500 12/04/18 0844 12/05/18 0530 12/06/18 0606 12/07/18 0605  WBC 15.7*   < > 9.9 8.8  --  8.0 7.4 8.0  NEUTROABS 13.0*  --  8.3*  --   --   --  5.5  --   HGB 14.9   < > 11.8* 11.7*  --  11.0* 11.2* 11.6*  HCT 44.5   < > 35.8* 36.2 39.2 33.4* 33.6* 35.2*  MCV 104.0*   < > 102.9* 104.9*  --  103.7* 102.1* 102.9*  PLT 206   < > 169 163  --  156 164 179   < > = values in this interval not displayed.   Basic Metabolic Panel: Recent Labs   Lab 12/03/18 0633 12/03/18 [id number]/16/20 0844 12/05/18 0500 12/05/18 0530 12/06/18 0606 12/07/18 0605  NA 140  --  139 142  --  140 142  K 3.4*  --  4.0 3.7  --  3.2* 3.6  CL 108  --  103 105  --  105 106  CO2 25  --  24 26  --  27 27  GLUCOSE 82  --  125* 106*  --  85 80  BUN 29*  --  27* 34*  --  33* 31*  CREATININE 0.92  --  0.89 1.03*  --  0.99 0.77  CALCIUM 8.3*  --  8.8* 8.7*  --  8.4* 8.6*  MG  --  1.9 2.0  --  2.0 1.9 1.9  PHOS 3.4  --  3.1 3.8  --  3.6 3.1   GFR: Estimated Creatinine Clearance: 59 mL/min (by C-G formula based on SCr of 0.77 mg/dL). Liver Function Tests: Recent Labs  Lab 12/03/18 1308 12/04/18 0844 12/05/18 0500 12/06/18 0606 12/07/18 0605  ALBUMIN 2.5* 2.7* 2.3* 2.3* 2.5*   No results for input(s): LIPASE, AMYLASE in the last 168 hours. No results for input(s): AMMONIA in the last 168 hours. Coagulation Profile: No results for input(s): INR, PROTIME in the last 168 hours. Cardiac Enzymes: Recent Labs  Lab 12/01/18 0317 12/01/18 0711 12/01/18 1245 12/01/18 1837  TROPONINI 0.54* 0.43* 0.32* 0.18*   BNP (last 3 results) No results for input(s): PROBNP in the last 8760 hours. HbA1C: No results for input(s): HGBA1C in the last 72 hours. CBG: Recent Labs  Lab 12/06/18 2028 12/07/18 0825 12/07/18 0913 12/07/18 0916 12/07/18 1001  GLUCAP 149* 56* 38* 40* 178*   Lipid Profile: No results for input(s): CHOL, HDL, LDLCALC, TRIG, CHOLHDL, LDLDIRECT in the last 72 hours. Thyroid Function Tests: No results for input(s): TSH, T4TOTAL, FREET4, T3FREE, THYROIDAB in the last 72 hours. Anemia Panel: No results for input(s): VITAMINB12, FOLATE, FERRITIN, TIBC, IRON, RETICCTPCT in the last 72 hours. Sepsis Labs: Recent Labs  Lab 12/01/18 0711 12/01/18 1245 12/01/18 1837 12/02/18 0407 12/02/18 1946 12/02/18 2221 12/03/18 0633  PROCALCITON 6.46 7.32  --  5.78  --   --   --   LATICACIDVEN 2.6* 2.4* 2.9*  --  3.9* 2.9* 1.2    Recent  Results (from the past 240 hour(s))  Urine culture     Status: None   Collection Time: 12/01/18  3:31 AM  Result Value Ref Range Status   Specimen Description URINE, RANDOM  Final   Special Requests NONE  Final   Culture   Final    NO GROWTH Performed at [facility], [place] [address], [place], [place] [address]    Report Status 12/02/2018 FINAL  Final  Blood culture (routine x 2)     Status: None   Collection Time: 12/01/18  4:00 AM  Result Value Ref Range Status   Specimen Description BLOOD RIGHT WRIST  Final   Special Requests   Final    BOTTLES DRAWN AEROBIC ONLY Blood Culture adequate volume   Culture   Final    NO GROWTH 5 DAYS Performed at [facility], [place] [address] [place], [place] [address]    Report Status 12/06/2018 FINAL  Final  SARS Coronavirus 2 (CEPHEID- Performed in [place] hospital lab), Hosp Order     Status: None   Collection Time: 12/01/18  4:02 AM  Result Value Ref Range Status   SARS Coronavirus 2 NEGATIVE NEGATIVE Final    Comment: (NOTE) If result is NEGATIVE SARS-CoV-2 target nucleic acids are NOT DETECTED. The SARS-CoV-2 RNA is generally detectable in upper and lower  respiratory specimens during the acute phase of infection. The lowest  concentration of SARS-CoV-2 viral copies this assay can detect is 250  copies / mL. A negative result does not preclude SARS-CoV-2 infection  and should not be used as the sole basis for treatment or other  patient management decisions.  A negative result may occur with  improper specimen collection / handling, submission of specimen other  than nasopharyngeal swab, presence of viral mutation(s) within the  areas targeted by this assay, and inadequate number of viral copies  (<250 copies / mL). A negative result must be combined with  clinical  observations, patient history, and epidemiological information. If result is POSITIVE SARS-CoV-2 target nucleic acids are DETECTED. The SARS-CoV-2 RNA is  generally detectable in upper and lower  respiratory specimens dur ing the acute phase of infection.  Positive  results are indicative of active infection with SARS-CoV-2.  Clinical  correlation with patient history and other diagnostic information is  necessary to determine patient infection status.  Positive results do  not rule out bacterial infection or co-infection with other viruses. If result is PRESUMPTIVE POSTIVE SARS-CoV-2 nucleic acids MAY BE PRESENT.   A presumptive positive result was obtained on the submitted specimen  and confirmed on repeat testing.  While 2019 novel coronavirus  (SARS-CoV-2) nucleic acids may be present in the submitted sample  additional confirmatory testing may be necessary for epidemiological  and / or clinical management purposes  to differentiate between  SARS-CoV-2 and other Sarbecovirus currently known to infect humans.  If clinically indicated additional testing with an alternate test  methodology [phone]) is advised. The SARS-CoV-2 RNA is generally  detectable in upper and lower respiratory sp ecimens during the acute  phase of infection. The expected result is Negative. Fact Sheet for Patients:  StrictlyIdeas.no Fact Sheet for Healthcare Providers: BankingDealers.co.za This test is not yet approved or cleared by the [place] FDA and has been authorized for detection and/or diagnosis of SARS-CoV-2 by FDA under an Emergency Use Authorization (EUA).  This EUA will remain in effect (meaning this test can be used) for the duration of the COVID-19 declaration under Section 564(b)(1) of the Act, 21 U.S.C. section 360bbb-3(b)(1), unless the authorization is terminated or revoked sooner. Performed at [facility], [place] [address] [place], [place] [address]   Respiratory Panel by PCR     Status: None   Collection Time: 12/01/18  4:02 AM  Result Value Ref Range Status   Adenovirus NOT  DETECTED NOT DETECTED Final   Coronavirus 229E NOT DETECTED NOT DETECTED Final    Comment: (NOTE) The Coronavirus on the Respiratory Panel, DOES NOT test for the novel  Coronavirus (2019 nCoV)    Coronavirus HKU1 NOT DETECTED NOT DETECTED Final   Coronavirus NL63 NOT DETECTED NOT DETECTED Final   Coronavirus OC43 NOT DETECTED NOT DETECTED Final   Metapneumovirus NOT DETECTED NOT DETECTED Final   Rhinovirus / Enterovirus NOT DETECTED NOT DETECTED Final   Influenza A NOT DETECTED NOT DETECTED Final   Influenza B NOT DETECTED NOT DETECTED Final   Parainfluenza Virus 1 NOT DETECTED NOT DETECTED Final   Parainfluenza Virus 2 NOT DETECTED NOT DETECTED Final   Parainfluenza Virus 3 NOT DETECTED NOT DETECTED Final   Parainfluenza Virus 4 NOT DETECTED NOT DETECTED Final   Respiratory Syncytial Virus NOT DETECTED NOT DETECTED Final   Bordetella pertussis NOT DETECTED NOT DETECTED Final   Chlamydophila pneumoniae NOT DETECTED NOT DETECTED Final   Mycoplasma pneumoniae NOT DETECTED NOT DETECTED Final    Comment: Performed at [facility], [place]. [address], [place], [place] [address]  Blood culture (routine x 2)     Status: None   Collection Time: 12/01/18  4:22 AM  Result Value Ref Range Status   Specimen Description BLOOD LEFT WRIST  Final   Special Requests   Final    BOTTLES DRAWN AEROBIC AND ANAEROBIC Blood Culture adequate volume   Culture   Final    NO GROWTH 5 DAYS Performed at [facility], [address], [place],  [address]  Report Status 12/06/2018 FINAL  Final  MRSA PCR Screening     Status: None   Collection Time: 12/01/18  4:43 PM  Result Value Ref Range Status   MRSA by PCR NEGATIVE NEGATIVE Final    Comment:        The GeneXpert MRSA Assay (FDA approved for NASAL specimens only), is one component of a comprehensive MRSA colonization surveillance program. It is not intended to diagnose MRSA infection nor to guide or monitor treatment for MRSA  infections. Performed at [facility], [place] [address], [place],  [address]          Radiology Studies: No results found.      Scheduled Meds: . amiodarone  400 mg Oral Daily  . carvedilol  12.5 mg Oral BID WC  . docusate sodium  100 mg Oral BID  . furosemide  40 mg Intravenous BID  . latanoprost  1 drop Both Eyes QHS  . mupirocin ointment   Topical BID  . predniSONE  10 mg Oral Q breakfast  . sodium chloride flush  3 mL Intravenous Q12H   Continuous Infusions:   LOS: 6 days    Time spent: 25 minutes spent in the coordination of care today.    Jonnie Finner, DO [facility] Pager [phone]  If 7PM-7AM, please contact night-coverage [email or web address] Password [email or web address] 12/07/2018, 1:25 PM

## 2018-12-07 NOTE — Progress Notes (Signed)
Called [place] to see if pt was soon. Informed some cases ahead of pt. Updated them to prepare for pt possibly not able to lye flat and drainage.

## 2018-12-08 LAB — GLUCOSE, CAPILLARY
Glucose-Capillary: 53 mg/dL — ABNORMAL LOW (ref 70–99)
Glucose-Capillary: 80 mg/dL (ref 70–99)
Glucose-Capillary: 167 mg/dL — ABNORMAL HIGH (ref 70–99)
Glucose-Capillary: 86 mg/dL (ref 70–99)
Glucose-Capillary: 137 mg/dL — ABNORMAL HIGH (ref 70–99)

## 2018-12-08 LAB — CBC
HCT: 27.2 % — ABNORMAL LOW (ref 36.0–46.0)
HCT: 28.8 % — ABNORMAL LOW (ref 36.0–46.0)
HCT: 29 % — ABNORMAL LOW (ref 36.0–46.0)
Hemoglobin: 9 g/dL — ABNORMAL LOW (ref 12.0–15.0)
Hemoglobin: 9.5 g/dL — ABNORMAL LOW (ref 12.0–15.0)
Hemoglobin: 9.5 g/dL — ABNORMAL LOW (ref 12.0–15.0)
MCH: 33.8 pg (ref 26.0–34.0)
MCH: 33.6 pg (ref 26.0–34.0)
MCH: 34.1 pg — ABNORMAL HIGH (ref 26.0–34.0)
MCHC: 33.1 g/dL (ref 30.0–36.0)
MCHC: 33 g/dL (ref 30.0–36.0)
MCHC: 32.8 g/dL (ref 30.0–36.0)
MCV: 102.3 fL — ABNORMAL HIGH (ref 80.0–100.0)
MCV: 101.8 fL — ABNORMAL HIGH (ref 80.0–100.0)
MCV: 103.9 fL — ABNORMAL HIGH (ref 80.0–100.0)
Platelets: 171 10*3/uL (ref 150–400)
Platelets: 203 10*3/uL (ref 150–400)
Platelets: 200 10*3/uL (ref 150–400)
RBC: 2.66 MIL/uL — ABNORMAL LOW (ref 3.87–5.11)
RBC: 2.83 MIL/uL — ABNORMAL LOW (ref 3.87–5.11)
RBC: 2.79 MIL/uL — ABNORMAL LOW (ref 3.87–5.11)
RDW: 14 % (ref 11.5–15.5)
RDW: 13.8 % (ref 11.5–15.5)
RDW: 13.9 % (ref 11.5–15.5)
WBC: 7.7 10*3/uL (ref 4.0–10.5)
WBC: 9.1 10*3/uL (ref 4.0–10.5)
WBC: 8.1 10*3/uL (ref 4.0–10.5)
nRBC: 0 % (ref 0.0–0.2)
nRBC: 0 % (ref 0.0–0.2)
nRBC: 0 % (ref 0.0–0.2)

## 2018-12-08 LAB — RENAL FUNCTION PANEL
Albumin: 2.3 g/dL — ABNORMAL LOW (ref 3.5–5.0)
Anion gap: 10 (ref 5–15)
BUN: 27 mg/dL — ABNORMAL HIGH (ref 8–23)
CO2: 28 mmol/L (ref 22–32)
Calcium: 8.2 mg/dL — ABNORMAL LOW (ref 8.9–10.3)
Chloride: 103 mmol/L (ref 98–111)
Creatinine, Ser: 0.86 mg/dL (ref 0.44–1.00)
GFR calc Af Amer: >60 mL/min (ref >60)
GFR calc non Af Amer: >60 mL/min (ref >60)
Glucose, Bld: 90 mg/dL (ref 70–99)
Phosphorus: 3.1 mg/dL (ref 2.5–4.6)
Potassium: 3.3 mmol/L — ABNORMAL LOW (ref 3.5–5.1)
Sodium: 141 mmol/L (ref 135–145)

## 2018-12-08 LAB — CBC WITH DIFFERENTIAL/PLATELET
Abs Immature Granulocytes: 0.31 10*3/uL — ABNORMAL HIGH (ref 0.00–0.07)
Basophils Absolute: 0 10*3/uL (ref 0.0–0.1)
Basophils Relative: 0 %
Eosinophils Absolute: 0.1 10*3/uL (ref 0.0–0.5)
Eosinophils Relative: 1 %
HCT: 29.3 % — ABNORMAL LOW (ref 36.0–46.0)
Hemoglobin: 9.6 g/dL — ABNORMAL LOW (ref 12.0–15.0)
Immature Granulocytes: 4 %
Lymphocytes Relative: 18 %
Lymphs Abs: 1.4 10*3/uL (ref 0.7–4.0)
MCH: 33.8 pg (ref 26.0–34.0)
MCHC: 32.8 g/dL (ref 30.0–36.0)
MCV: 103.2 fL — ABNORMAL HIGH (ref 80.0–100.0)
Monocytes Absolute: 0.6 10*3/uL (ref 0.1–1.0)
Monocytes Relative: 8 %
Neutro Abs: 5.1 10*3/uL (ref 1.7–7.7)
Neutrophils Relative %: 69 %
Platelets: 182 10*3/uL (ref 150–400)
RBC: 2.84 MIL/uL — ABNORMAL LOW (ref 3.87–5.11)
RDW: 14 % (ref 11.5–15.5)
WBC: 7.4 10*3/uL (ref 4.0–10.5)
nRBC: 0 % (ref 0.0–0.2)

## 2018-12-08 LAB — MAGNESIUM: Magnesium: 1.8 mg/dL (ref 1.7–2.4)

## 2018-12-08 MED ORDER — POTASSIUM CHLORIDE CRYS ER 20 MEQ PO TBCR
40.0000 meq | EXTENDED_RELEASE_TABLET | ORAL | Status: AC
  Administered 2018-12-08 (×2): 40 meq via ORAL
  Filled 2018-12-08: qty 2

## 2018-12-08 MED ORDER — FERROUS SULFATE 325 (65 FE) MG PO TABS
325.0000 mg | ORAL_TABLET | Freq: Three times a day (TID) | ORAL | Status: DC
  Administered 2018-12-08 – 2018-12-10 (×6): 325 mg via ORAL
  Filled 2018-12-08 (×7): qty 1

## 2018-12-08 MED ORDER — LOSARTAN POTASSIUM 25 MG PO TABS
25.0000 mg | ORAL_TABLET | Freq: Every day | ORAL | Status: DC
  Administered 2018-12-08 – 2018-12-09 (×2): 25 mg via ORAL
  Filled 2018-12-08 (×3): qty 1

## 2018-12-08 MED ORDER — CYANOCOBALAMIN 1000 MCG/ML IJ SOLN
1000.0000 ug | Freq: Once | INTRAMUSCULAR | Status: AC
  Administered 2018-12-08: 1000 ug via SUBCUTANEOUS
  Filled 2018-12-08: qty 1

## 2018-12-08 MED ORDER — FUROSEMIDE 20 MG PO TABS
20.0000 mg | ORAL_TABLET | Freq: Every day | ORAL | Status: DC
  Administered 2018-12-09: 20 mg via ORAL
  Filled 2018-12-08: qty 1

## 2018-12-08 MED ORDER — MAGNESIUM SULFATE 2 GM/50ML IV SOLN
2.0000 g | Freq: Once | INTRAVENOUS | Status: AC
  Administered 2018-12-08: 2 g via INTRAVENOUS
  Filled 2018-12-08: qty 50

## 2018-12-08 NOTE — Progress Notes (Signed)
Progress Note  Patient Name: Kelsey [name] Date of Encounter: 12/08/2018  Primary Cardiologist: Mahala Menghini, MD   Subjective   Eating breakfast. Breathing better.  Inpatient Medications    Scheduled Meds: . amiodarone  200 mg Oral Daily  . carvedilol  25 mg Oral BID WC  . docusate sodium  100 mg Oral BID  . furosemide  40 mg Intravenous BID  . latanoprost  1 drop Both Eyes QHS  . mupirocin ointment   Topical BID  . potassium chloride  40 mEq Oral Q2H  . predniSONE  10 mg Oral Q breakfast  . sodium chloride flush  3 mL Intravenous Q12H   Continuous Infusions:  PRN Meds: acetaminophen **OR** acetaminophen, dextrose, diphenhydrAMINE, HYDROcodone-acetaminophen, lidocaine, ondansetron **OR** ondansetron (ZOFRAN) IV, polyethylene glycol, polyvinyl alcohol   Vital Signs    Vitals:   12/08/18 0256 12/08/18 0313 12/08/18 0434 12/08/18 0754  BP: (!) 103/48 111/63  126/84  Pulse: 98 (!) 101  (!) 105  Resp:    18  Temp:  97.8 F (36.6 C)  98.2 F (36.8 C)  TempSrc:  Oral  Oral  SpO2: 98% 99%  97%  Weight:   79.9 kg   Height:        Intake/Output Summary (Last 24 hours) at 12/08/2018 1122 Last data filed at 12/07/2018 2100 Gross per 24 hour  Intake 240 ml  Output -  Net 240 ml   Last 3 Weights 12/08/2018 12/07/2018 12/06/2018  Weight (lbs) 176 lb 2.4 oz 182 lb 1.6 oz 183 lb 13.8 oz  Weight (kg) 79.9 kg 82.6 kg 83.4 kg      Telemetry    AF with VR -100 bpm since increasing Carvedilol. - Personally Reviewed  ECG    No new data - Personally Reviewed  Physical Exam  Elderly and frail. GEN: No acute distress.   Neck: No JVD Cardiac: RRR, no murmurs, rubs, or gallops.  Respiratory: Clear to auscultation bilaterally. GI: Soft, nontender, non-distended  MS: No edema; No deformity. Neuro:  Nonfocal  Psych: Normal affect   Labs    Chemistry Recent Labs  Lab 12/06/18 0606 12/07/18 0605 12/08/18 0304  NA 140 142 141  K 3.2* 3.6 3.3*  CL 105 106  103  CO2 27 27 28   GLUCOSE 85 80 90  BUN 33* 31* 27*  CREATININE 0.99 0.77 0.86  CALCIUM 8.4* 8.6* 8.2*  ALBUMIN 2.3* 2.5* 2.3*  GFRNONAA 52* >60 >60  GFRAA >60 >60 >60  ANIONGAP 8 9 10      Hematology Recent Labs  Lab 12/07/18 2104 12/08/18 0304 12/08/18 0952  WBC 7.3 7.4 7.7  RBC 2.83* 2.84* 2.66*  HGB 9.7* 9.6* 9.0*  HCT 29.2* 29.3* 27.2*  MCV 103.2* 103.2* 102.3*  MCH 34.3* 33.8 33.8  MCHC 33.2 32.8 33.1  RDW 14.0 14.0 14.0  PLT 178 182 171    Cardiac Enzymes Recent Labs  Lab 12/01/18 1245 12/01/18 1837  TROPONINI 0.32* 0.18*   No results for input(s): TROPIPOC in the last 168 hours.   BNPNo results for input(s): BNP, PROBNP in the last 168 hours.   DDimer No results for input(s): DDIMER in the last 168 hours.   Radiology    Ct Angio Low Extrem Right W &/or Wo Contrast  Addendum Date: 12/07/2018   ADDENDUM REPORT: 12/07/2018 22:49 ADDENDUM: Study discussed by telephone with NP XENIA BLOUNT on 12/07/2018 at 1043 hours. Electronically Signed   By: Genevie Ann M.D.   On: 12/07/2018 22:49  Result Date: 12/07/2018 CLINICAL DATA:  85 year old female with right medial calf hematoma after blunt trauma. Possible active bleeding on ultrasound earlier today. EXAM: CT ANGIOGRAPHY OF THE RIGHT LOWEREXTREMITY TECHNIQUE: Multidetector CT imaging of the right lowerwas performed using the standard protocol during bolus administration of intravenous contrast. Multiplanar CT image reconstructions and MIPs were obtained to evaluate the vascular anatomy. CONTRAST:  136mL OMNIPAQUE IOHEXOL 350 MG/ML SOLN COMPARISON:  Right lower extremity ultrasound earlier today. FINDINGS: RIGHT LOWER EXTREMITY: Large superficial mixed density collection with a dependent hematocrit level (series 5, image 416) encompasses 64 x 133 x 144 millimeters (AP by transverse by CC) for an estimated hematoma volume of 613 milliliters. See also series 8, image 89. Superimposed right lower extremity subcutaneous  stranding. No superimposed acute osseous abnormality identified. Right hip arthroplasty with streak artifact in the right pelvis and proximal right lower extremity. No knee joint effusion. No soft tissue gas. VASCULAR: Aortoiliac calcified atherosclerosis. The visible bilateral iliac arteries are patent. There is no right iliac stenosis. The right common femoral artery is patent with calcified plaque, but the right SFA appears occluded just beyond the bifurcation (series 5, image 128). Calcified plaque continues in the SFA and the right profundus femoral artery. The right profundal remains patent (series 5, image 170). The SFA remains occluded until reconstituted just cephalad of the popliteal fossa on image 232. Popliteal artery atherosclerosis with at least moderate stenosis (image 276). Superimposed calcified peripheral vascular disease with 2 vessel runoff, the posterior tibial artery is occluded just beyond its origin on series 5, image 376. There is no active extravasation of contrast identified. Review of the MIP images confirms the above findings. NONVASCULAR: Negative visible lower abdominal and pelvic viscera. IMPRESSION: 1. Large superficial hematoma in the right calf with estimated blood volume 613 mL. No active extravasation of contrast identified. No associated acute osseous abnormality. 2. Occlusion of the Right SFA just beyond the bifurcation with reconstituted right popliteal artery. Calcified peripheral vascular disease with two vessel runoff, occluded right posterior tibial artery. Electronically Signed: By: Genevie Ann M.D. On: 12/07/2018 22:32   [place] Rt Lower Extrem Ltd Soft Tissue Non Vascular  Addendum Date: 12/07/2018   ADDENDUM REPORT: 12/07/2018 20:30 ADDENDUM: These results were called by telephone at the time of interpretation on 12/07/2018 at 8:30 pm to Dr. Kennon Holter , who verbally acknowledged these results. Electronically Signed   By: Constance Holster M.D.   On: 12/07/2018 20:30   Result  Date: 12/07/2018 CLINICAL DATA:  Hematoma EXAM: ULTRASOUND right LOWER EXTREMITY LIMITED TECHNIQUE: Ultrasound examination of the lower extremity soft tissues was performed in the area of clinical concern. COMPARISON:  None. FINDINGS: There is a large 6.3 x 3.4 cm collection in the right medial calf. There are fluid fluid levels within this collection. On the Doppler images, there appears to be evidence of active bleeding into the hematoma. IMPRESSION: Large right lower extremity hematoma with sonographic evidence of active bleeding. Report to be addended once critical findings reported to the ordering physician. Electronically Signed: By: Constance Holster M.D. On: 12/07/2018 20:01    Cardiac Studies   None  Patient Profile     85 y.o. female a PMH of HTN, HLD, pulmonary HTN, atrial fibrillation not on anticoagulation, vocal cord paralysis, and chronic prednisone use for unknown reason,whois being followed by cardiologyforatrial fibrillation with RVRand acute combined CHF.  Assessment & Plan    1. Atrial fibrillation with RVR, improving on high-dose beta-blocker therapy.  Amiodarone has been decreased.  Her cardiologist at home had discontinued amiodarone from a baseline dose of 100 mg greater than 6 months ago.  The idea per primary cardiologist was to add diltiazem if needed for rate control and remove the potential toxicity associated with amiodarone. 2. Acute combined systolic and diastolic HF: Anticipate there will be recovery of LV systolic function.  Toxin induced myocardial depression and possible component from tachycardia may have contributed to acute reduction.  This however is an hypothesis, and we should ensure that she is on guideline directed therapy for systolic dysfunction.  Start losartan 25 mg/day.  Watch blood pressure and kidney function.  Furosemide will be decreased to 20 mg daily, also watching kidney function.  May need higher dose depending upon volume status/weights.      For questions or updates, please contact [place] Please consult [email or web address] for contact info under        Signed, Sinclair Grooms, MD  12/08/2018, 11:22 AM

## 2018-12-08 NOTE — Progress Notes (Signed)
[facility] Progress Note  Patient: Kelsey [name] DZH:[id number]   PCP: [name], MD DOB: 22-Jul-1931   DOA: 12/01/2018   DOS: 12/08/2018   Date of Service: the patient was seen and examined on 12/08/2018  Brief hospital course: Pt. with PMH of HTN, HLD, vocal cord paralysis, chronic A. fib on anticoagulation, chronic diastolic CHF, pulmonary HTN; admitted on 12/01/2018, presented with complaint of shortness of breath, was found to have Shock likely combination of septic shock as well as cardiogenic shock. Currently further plan is to control A. fib and monitor H&H.  Subjective: Reports pain in her right leg.  No nausea no vomiting.  No bleeding anywhere else.  No fever no chills.  Breathing is better.  Assessment and Plan: 1. Septic shock-resolved Sepsis POA unknown etiology COVID negative Influenza respiratory virus pathogen panel negative Blood cultures no growth till date. Chest x-ray clear. Urine culture no growth. Treated with IV fluids.  IV cefepime and IV vancomycin. Completed 7 days of antibiotic course so far. Leukocytosis improved.  No further fever.  2.  A. fib with RVR.  Refractory chronic Cardiogenic shock. Acute systolic CHF combined with chronic diastolic CHF. Acute hypoxic respiratory failure Essential hypertension Systolic dysfunction likely secondary to anemia, tachycardia induced cardiomyopathy.  Treated with IV amiodarone and metoprolol. At home was on chronic Xarelto which she stopped on her own. Treated with Lovenox initially now currently on hold. Cardiology following. On Coreg now as well as oral amiodarone. We will probably hold anticoagulation on discharge due to severe calf hematoma. Continue IV diuresis.  3.  Right calf hematoma.   Acute blood loss anemia.  Macrocytosis Baseline hemoglobin 11 currently in 9 range. Discontinue Lovenox. Ultrasound soft tissue on 12/07/2018 suggest sonographic evidence of active  bleeding. Stat CT angiogram showed no extravasation of contrast on 12/07/2018. Monitor circumference and pulses.  Pain control B12 320, relatively low.  Will provide B12 injection x1.  4.  Peripheral vascular disease. Identified on CTA. Currently monitoring.  5.  Acute kidney injury chronic kidney disease stage III. Renal function currently stable.  Baseline serum creatinine 0.7 Presents with AKI, serum creatinine of 1.4. Now serum creatinine again back to baseline. Nephrology monitoring diuresis.  6.  Recurrent fall. PT OT consulted. Recommend home with home health 24-hour supervision.  7.  Chronic steroid use Continue for now. No indication for stress dose steroids.  Diet: Cardiac diet DVT Prophylaxis: mechanical compression device  Advance goals of care discussion: DNR DNI  Family Communication: no family was present at bedside, at the time of interview.   Disposition:  Discharge to be determined .  Consultants: cardiology, PCCM  Procedures: Echocardiogram  Scheduled Meds:  amiodarone  200 mg Oral Daily   carvedilol  25 mg Oral BID WC   docusate sodium  100 mg Oral BID   furosemide  40 mg Intravenous BID   latanoprost  1 drop Both Eyes QHS   mupirocin ointment   Topical BID   predniSONE  10 mg Oral Q breakfast   sodium chloride flush  3 mL Intravenous Q12H   Continuous Infusions: PRN Meds: acetaminophen **OR** acetaminophen, dextrose, diphenhydrAMINE, HYDROcodone-acetaminophen, lidocaine, ondansetron **OR** ondansetron (ZOFRAN) IV, polyethylene glycol, polyvinyl alcohol Antibiotics: Anti-infectives (From admission, onward)   Start     Dose/Rate Route Frequency Ordered Stop   12/04/18 1300  levofloxacin (LEVAQUIN) tablet 750 mg  Status:  Discontinued     750 mg Oral Daily 12/04/18 1150 12/06/18 1334   12/03/18 0600  vancomycin (VANCOCIN)  IVPB 1000 mg/200 mL premix  Status:  Discontinued     1,000 mg 200 mL/hr over 60 Minutes Intravenous Every 48 hours  12/01/18 0652 12/02/18 0836   12/02/18 1000  vancomycin (VANCOCIN) IVPB 1000 mg/200 mL premix  Status:  Discontinued     1,000 mg 200 mL/hr over 60 Minutes Intravenous Every 24 hours 12/02/18 0836 12/04/18 0713   12/02/18 0900  ceFEPIme (MAXIPIME) 2 g in sodium chloride 0.9 % 100 mL IVPB  Status:  Discontinued     2 g 200 mL/hr over 30 Minutes Intravenous Every 12 hours 12/02/18 0836 12/04/18 1150   12/02/18 0800  ceFEPIme (MAXIPIME) 2 g in sodium chloride 0.9 % 100 mL IVPB  Status:  Discontinued     2 g 200 mL/hr over 30 Minutes Intravenous Every 24 hours 12/01/18 0652 12/02/18 0836   12/01/18 0500  metroNIDAZOLE (FLAGYL) IVPB 500 mg     500 mg 100 mL/hr over 60 Minutes Intravenous  Once 12/01/18 0452 12/01/18 0603   12/01/18 0500  ceFEPIme (MAXIPIME) 2 g in sodium chloride 0.9 % 100 mL IVPB     2 g 200 mL/hr over 30 Minutes Intravenous  Once 12/01/18 0454 12/01/18 0535   12/01/18 0500  vancomycin (VANCOCIN) IVPB 1000 mg/200 mL premix     1,000 mg 200 mL/hr over 60 Minutes Intravenous  Once 12/01/18 0454 12/01/18 0608       Objective: Physical Exam: Vitals:   12/08/18 0256 12/08/18 0313 12/08/18 0434 12/08/18 0754  BP: (!) 103/48 111/63  126/84  Pulse: 98 (!) 101  (!) 105  Resp:    18  Temp:  97.8 F (36.6 C)  98.2 F (36.8 C)  TempSrc:  Oral  Oral  SpO2: 98% 99%  97%  Weight:   79.9 kg   Height:        Intake/Output Summary (Last 24 hours) at 12/08/2018 0817 Last data filed at 12/07/2018 2100 Gross per 24 hour  Intake 240 ml  Output 650 ml  Net -410 ml   Filed Weights   12/06/18 0300 12/07/18 0600 12/08/18 0434  Weight: 83.4 kg 82.6 kg 79.9 kg   General: Alert, Awake and Oriented to Time, Place and Person. Appear in mild distress, affect flat Eyes: PERRL, Conjunctiva normal ENT: Oral Mucosa clear moist Neck: positive JVD, no Abnormal Mass Or lumps Cardiovascular: S1 and S2 Present, aortic systolic  Murmur, Peripheral Pulses Present Respiratory: increased  respiratory effort, Bilateral Air entry equal and Decreased, no use of accessory muscle, bilateral  Crackles, no wheezes Abdomen: Bowel Sound present, Soft and no tenderness, no hernia Skin: no redness, no Rash, no induration Extremities: bilateral  Pedal edema, right calf tenderness Neurologic: Grossly no focal neuro deficit. Bilaterally Equal motor strength  Data Reviewed: CBC: Recent Labs  Lab 12/03/18 0638  12/06/18 0606 12/07/18 0605 12/07/18 2000 12/07/18 2104 12/08/18 0304  WBC 9.9   < > 7.4 8.0 8.6 7.3 7.4  NEUTROABS 8.3*  --  5.5  --   --   --  5.1  HGB 11.8*   < > 11.2* 11.6* 11.2* 9.7* 9.6*  HCT 35.8*   < > 33.6* 35.2* 33.7* 29.2* 29.3*  MCV 102.9*   < > 102.1* 102.9* 103.1* 103.2* 103.2*  PLT 169   < > 164 179 196 178 182   < > = values in this interval not displayed.   Basic Metabolic Panel: Recent Labs  Lab 12/04/18 0844 12/05/18 0500 12/05/18 0530 12/06/18 0606 12/07/18 1950 12/08/18  0304  NA 139 142  --  140 142 141  K 4.0 3.7  --  3.2* 3.6 3.3*  CL 103 105  --  105 106 103  CO2 24 26  --  27 27 28   GLUCOSE 125* 106*  --  85 80 90  BUN 27* 34*  --  33* 31* 27*  CREATININE 0.89 1.03*  --  0.99 0.77 0.86  CALCIUM 8.8* 8.7*  --  8.4* 8.6* 8.2*  MG 2.0  --  2.0 1.9 1.9 1.8  PHOS 3.1 3.8  --  3.6 3.1 3.1    Liver Function Tests: Recent Labs  Lab 12/04/18 0844 12/05/18 0500 12/06/18 0606 12/07/18 0605 12/08/18 0304  ALBUMIN 2.7* 2.3* 2.3* 2.5* 2.3*   No results for input(s): LIPASE, AMYLASE in the last 168 hours. No results for input(s): AMMONIA in the last 168 hours. Coagulation Profile: No results for input(s): INR, PROTIME in the last 168 hours. Cardiac Enzymes: Recent Labs  Lab 12/01/18 1245 12/01/18 1837  TROPONINI 0.32* 0.18*   BNP (last 3 results) No results for input(s): PROBNP in the last 8760 hours. CBG: Recent Labs  Lab 12/07/18 1001 12/07/18 1200 12/07/18 1636 12/07/18 1842 12/07/18 2321  GLUCAP 178* 99 143* 121* 87    Studies: Ct Angio Low Extrem Right W &/or Wo Contrast  Addendum Date: 12/07/2018   ADDENDUM REPORT: 12/07/2018 22:49 ADDENDUM: Study discussed by telephone with NP XENIA BLOUNT on 12/07/2018 at 1043 hours. Electronically Signed   By: Genevie Ann M.D.   On: 12/07/2018 22:49   Result Date: 12/07/2018 CLINICAL DATA:  85 year old female with right medial calf hematoma after blunt trauma. Possible active bleeding on ultrasound earlier today. EXAM: CT ANGIOGRAPHY OF THE RIGHT LOWEREXTREMITY TECHNIQUE: Multidetector CT imaging of the right lowerwas performed using the standard protocol during bolus administration of intravenous contrast. Multiplanar CT image reconstructions and MIPs were obtained to evaluate the vascular anatomy. CONTRAST:  128mL OMNIPAQUE IOHEXOL 350 MG/ML SOLN COMPARISON:  Right lower extremity ultrasound earlier today. FINDINGS: RIGHT LOWER EXTREMITY: Large superficial mixed density collection with a dependent hematocrit level (series 5, image 416) encompasses 64 x 133 x 144 millimeters (AP by transverse by CC) for an estimated hematoma volume of 613 milliliters. See also series 8, image 89. Superimposed right lower extremity subcutaneous stranding. No superimposed acute osseous abnormality identified. Right hip arthroplasty with streak artifact in the right pelvis and proximal right lower extremity. No knee joint effusion. No soft tissue gas. VASCULAR: Aortoiliac calcified atherosclerosis. The visible bilateral iliac arteries are patent. There is no right iliac stenosis. The right common femoral artery is patent with calcified plaque, but the right SFA appears occluded just beyond the bifurcation (series 5, image 128). Calcified plaque continues in the SFA and the right profundus femoral artery. The right profundal remains patent (series 5, image 170). The SFA remains occluded until reconstituted just cephalad of the popliteal fossa on image 232. Popliteal artery atherosclerosis with at least  moderate stenosis (image 276). Superimposed calcified peripheral vascular disease with 2 vessel runoff, the posterior tibial artery is occluded just beyond its origin on series 5, image 376. There is no active extravasation of contrast identified. Review of the MIP images confirms the above findings. NONVASCULAR: Negative visible lower abdominal and pelvic viscera. IMPRESSION: 1. Large superficial hematoma in the right calf with estimated blood volume 613 mL. No active extravasation of contrast identified. No associated acute osseous abnormality. 2. Occlusion of the Right SFA just beyond the bifurcation with  reconstituted right popliteal artery. Calcified peripheral vascular disease with two vessel runoff, occluded right posterior tibial artery. Electronically Signed: By: Genevie Ann M.D. On: 12/07/2018 22:32   [place] Rt Lower Extrem Ltd Soft Tissue Non Vascular  Addendum Date: 12/07/2018   ADDENDUM REPORT: 12/07/2018 20:30 ADDENDUM: These results were called by telephone at the time of interpretation on 12/07/2018 at 8:30 pm to Dr. Kennon Holter , who verbally acknowledged these results. Electronically Signed   By: Constance Holster M.D.   On: 12/07/2018 20:30   Result Date: 12/07/2018 CLINICAL DATA:  Hematoma EXAM: ULTRASOUND right LOWER EXTREMITY LIMITED TECHNIQUE: Ultrasound examination of the lower extremity soft tissues was performed in the area of clinical concern. COMPARISON:  None. FINDINGS: There is a large 6.3 x 3.4 cm collection in the right medial calf. There are fluid fluid levels within this collection. On the Doppler images, there appears to be evidence of active bleeding into the hematoma. IMPRESSION: Large right lower extremity hematoma with sonographic evidence of active bleeding. Report to be addended once critical findings reported to the ordering physician. Electronically Signed: By: Constance Holster M.D. On: 12/07/2018 20:01     Time spent: 35 minutes  Author: Berle Mull, MD [facility] 12/08/2018 8:17 AM  To reach On-call, see care teams to locate the attending and reach out to them via [email or web address]. If 7PM-7AM, please contact night-coverage If you still have difficulty reaching the attending provider, please page the [facility] (Director on Call) for [facility] on amion for assistance.

## 2018-12-08 NOTE — Progress Notes (Signed)
Patient assisted to the back to bed from the [facility] with staff when the RLE hematoma burst opened. A large amount of sanguinous drainage oozed rom leg.  This RN was immediately called to room. VS were obtained and stable. Blount, NP and rapid RN notified and both came to examine the patient. Right leg dressed with gauze, kerlix, and ACE wrap. Pulses assessed Q2 throughout the night and were able to be dopplered.  RLE now smaller in diameter than LLE. Morning lab work obtained and resulted. Patient states her leg feels much better and she is now resting comfortable.

## 2018-12-08 NOTE — Progress Notes (Signed)
Physical Therapy Treatment Patient Details Name: Kelsey [name] MRN: [id number] DOB: [birth year] Today's Date: 12/08/2018    History of Present Illness Patient is an 85 y/o female presenting to the ED on 12/01/2018 with primary complaints of fever. Past medical history significant of pulmonary HTN; HTN; HLD; vocal cord paralysis;  Diastolic dysfunction; and afib previously on Xarelto. Admitted for Shock, likely mixed septic and cardiogenic work-up.    PT Comments    Continuing work on functional mobility and activity tolerance;  Kelsey [name] did not get much sleep last night, and had finally felt like the pain form her previously bleeding hematoma (now dressed and Ace wrapped) was finally calmed down, and she politely declines hallway ambulation today  Follow Up Recommendations  Home health PT;Supervision/Assistance - 24 hour     Equipment Recommendations  None recommended by PT    Recommendations for Other Services       Precautions / Restrictions Precautions Precautions: Fall    Mobility  Bed Mobility                  Transfers Overall transfer level: Needs assistance Equipment used: 1 person hand held assist(and held to bed rail) Transfers: Sit to/from Stand Sit to Stand: [place] assist         General transfer comment: Min assist to power up from [facility]; BSC was facing bed, and Kelsey [name] was able to reach for bed rail for stability while coming to stand  Ambulation/Gait Ambulation/Gait assistance: Min guard Gait Distance (Feet): (pivotal steps, including backwards, bed to recliner) Assistive device: 1 person hand held assist Gait Pattern/deviations: Shuffle Gait velocity: decreased   General Gait Details: Support for balance; Pt politely declined progressive ambulation in hallway, very tired from eventful night and a bit sore from bleeding hematoma RLE   Stairs             Wheelchair Mobility    Modified Rankin (Stroke Patients Only)       Balance      Sitting balance-Leahy Scale: Fair       Standing balance-Leahy Scale: Poor Standing balance comment: states she feels like she has to use RW - encouraged this at home                            Cognition Arousal/Alertness: Awake/alert Behavior During Therapy: WFL for tasks assessed/performed Overall Cognitive Status: Within Functional Limits for tasks assessed                                        Exercises      General Comments        Pertinent Vitals/Pain Pain Assessment: Faces Faces Pain Scale: Hurts little more Pain Location: R lower leg Pain Descriptors / Indicators: Aching;Grimacing;Guarding Pain Intervention(s): Monitored during session    Home Living                      Prior Function            PT Goals (current goals can now be found in the care plan section) Acute Rehab PT Goals Patient Stated Goal: return home soon PT Goal Formulation: With patient Time For Goal Achievement: 12/18/18 Potential to Achieve Goals: Good Progress towards PT goals: Progressing toward goals    Frequency    Min 3X/week      PT  Plan Current plan remains appropriate    Co-evaluation              AM-PAC PT "6 Clicks" Mobility   Outcome Measure  Help needed turning from your back to your side while in a flat bed without using bedrails?: A Little Help needed moving from lying on your back to sitting on the side of a flat bed without using bedrails?: A Little Help needed moving to and from a bed to a chair (including a wheelchair)?: A Little Help needed standing up from a chair using your arms (e.g., wheelchair or bedside chair)?: A Little Help needed to walk in hospital room?: A Little Help needed climbing 3-5 steps with a railing? : A Lot 6 Click Score: 17    End of Session Equipment Utilized During Treatment: Gait belt Activity Tolerance: Patient tolerated treatment well Patient left: in chair;with call bell/phone  within reach Nurse Communication: Mobility status PT Visit Diagnosis: Unsteadiness on feet (R26.81);Other abnormalities of gait and mobility (R26.89);Muscle weakness (generalized) (M62.81)     Time: 1010-1027 PT Time Calculation (min) (ACUTE ONLY): 17 min  Charges:  $Therapeutic Activity: 8-22 mins                     Roney Marion, PT  Acute Rehabilitation Services Pager [phone] Office [place] 12/08/2018, 11:15 AM

## 2018-12-09 LAB — CORTISOL: Cortisol, Plasma: 7.1 ug/dL

## 2018-12-09 LAB — BASIC METABOLIC PANEL
Anion gap: 6 (ref 5–15)
Anion gap: 9 (ref 5–15)
BUN: 25 mg/dL — ABNORMAL HIGH (ref 8–23)
BUN: 28 mg/dL — ABNORMAL HIGH (ref 8–23)
CO2: 29 mmol/L (ref 22–32)
CO2: 27 mmol/L (ref 22–32)
Calcium: 8.1 mg/dL — ABNORMAL LOW (ref 8.9–10.3)
Calcium: 8.1 mg/dL — ABNORMAL LOW (ref 8.9–10.3)
Chloride: 107 mmol/L (ref 98–111)
Chloride: 103 mmol/L (ref 98–111)
Creatinine, Ser: 0.87 mg/dL (ref 0.44–1.00)
Creatinine, Ser: 0.94 mg/dL (ref 0.44–1.00)
GFR calc Af Amer: >60 mL/min (ref >60)
GFR calc Af Amer: >60 mL/min (ref >60)
GFR calc non Af Amer: >60 mL/min (ref >60)
GFR calc non Af Amer: 56 mL/min — ABNORMAL LOW (ref >60)
Glucose, Bld: 78 mg/dL (ref 70–99)
Glucose, Bld: 202 mg/dL — ABNORMAL HIGH (ref 70–99)
Potassium: 4.1 mmol/L (ref 3.5–5.1)
Potassium: 4.8 mmol/L (ref 3.5–5.1)
Sodium: 142 mmol/L (ref 135–145)
Sodium: 139 mmol/L (ref 135–145)

## 2018-12-09 LAB — GLUCOSE, CAPILLARY
Glucose-Capillary: 151 mg/dL — ABNORMAL HIGH (ref 70–99)
Glucose-Capillary: 176 mg/dL — ABNORMAL HIGH (ref 70–99)
Glucose-Capillary: 213 mg/dL — ABNORMAL HIGH (ref 70–99)
Glucose-Capillary: 28 mg/dL — CL (ref 70–99)
Glucose-Capillary: 38 mg/dL — CL (ref 70–99)
Glucose-Capillary: 53 mg/dL — ABNORMAL LOW (ref 70–99)
Glucose-Capillary: 63 mg/dL — ABNORMAL LOW (ref 70–99)
Glucose-Capillary: 77 mg/dL (ref 70–99)
Glucose-Capillary: 95 mg/dL (ref 70–99)

## 2018-12-09 MED ORDER — PREDNISONE 20 MG PO TABS
20.0000 mg | ORAL_TABLET | Freq: Every day | ORAL | Status: DC
  Administered 2018-12-10: 20 mg via ORAL
  Filled 2018-12-09 (×2): qty 1

## 2018-12-09 MED ORDER — COSYNTROPIN 0.25 MG IJ SOLR
0.2500 mg | Freq: Once | INTRAMUSCULAR | Status: AC
  Administered 2018-12-10: 0.25 mg via INTRAVENOUS
  Filled 2018-12-09: qty 0.25

## 2018-12-09 MED ORDER — PREDNISONE 10 MG PO TABS
10.0000 mg | ORAL_TABLET | Freq: Once | ORAL | Status: AC
  Administered 2018-12-09: 10 mg via ORAL
  Filled 2018-12-09: qty 1

## 2018-12-09 NOTE — Progress Notes (Signed)
CARDIOLOGY FOLLOW-UP   Reasonable rate control or AF. Decrease amiodarone to 100 mg daily at DC. Primary Cardiologist Jeanelle Malling) and patient will probably DC as before.  LV dysfunction treated with Carvedilol and Losartan--> needs repeat ECHO in 3 months to reassess LV recovery.  [facility] will sign off.   Medication Recommendations:  See above Other recommendations (labs, testing, etc):  See above  Follow up as an outpatient:  Primary cardiologist, Jonetta Osgood

## 2018-12-09 NOTE — Care Management Important Message (Signed)
Important Message  Patient Details  Name: Kelsey [name] MRN: [id number] Date of Birth: Oct 23, 1931   Medicare Important Message Given:  Yes    [name] [address] 12/09/2018, 10:41 AM

## 2018-12-09 NOTE — Progress Notes (Signed)
Inpatient Diabetes Program Recommendations  AACE/ADA: New Consensus Statement on Inpatient Glycemic Control (2015)  Target Ranges:  Prepandial:   less than 140 mg/dL      Peak postprandial:   less than 180 mg/dL (1-2 hours)      Critically ill patients:  140 - 180 mg/dL   Lab Results  Component Value Date   GLUCAP 95 12/09/2018    Review of Glycemic Control Results for Kelsey [name], Kelsey [name]" (MRN [id number]) as of 12/09/2018 10:49  Ref. Range 12/09/2018 09:22 12/09/2018 09:43 12/09/2018 10:15  Glucose-Capillary Latest Ref Range: 70 - 99 mg/dL 53 (L) 28 (LL) 95   Diabetes history: no history noted Outpatient Diabetes medications: none Current orders for Inpatient glycemic control: none  Inpatient Diabetes Program Recommendations:     Noted AM FSBS of 28 mg/dL. No history of DM noted. However, patient does have history of falls. Question relationship between falls and hypoglycemia. Per MD note, sepsis resolved.  MD secure chat sent for plan.  Thanks, Bronson Curb, MSN, RNC-OB Diabetes Coordinator [phone] (8a-5p)

## 2018-12-09 NOTE — Progress Notes (Signed)
[facility] Progress Note  Patient: Kelsey [name] ZHY:[id number]   PCP: [name], MD DOB: 04-28-1932   DOA: 12/01/2018   DOS: 12/09/2018   Date of Service: the patient was seen and examined on 12/09/2018  Brief hospital course: Pt. with PMH of HTN, HLD, vocal cord paralysis, chronic A. fib on anticoagulation, chronic diastolic CHF, pulmonary HTN; admitted on 12/01/2018, presented with complaint of shortness of breath, was found to have Shock likely combination of septic shock as well as cardiogenic shock. Currently further plan is to control A. fib and monitor H&H.  Subjective: No acute complaint no nausea no vomiting.  Frequent hypoglycemic episodes.  Asymptomatic.  Assessment and Plan: 1. Septic shock-resolved Sepsis POA unknown etiology COVID negative Influenza respiratory virus pathogen panel negative Blood cultures no growth till date. Chest x-ray clear. Urine culture no growth. Treated with IV fluids.  IV cefepime and IV vancomycin. Completed 7 days of antibiotic course so far. Leukocytosis improved.  No further fever.  2.  A. fib with RVR.  Refractory chronic Cardiogenic shock. Acute systolic CHF combined with chronic diastolic CHF. Acute hypoxic respiratory failure Essential hypertension Systolic dysfunction likely secondary to anemia, tachycardia induced cardiomyopathy.  Treated with IV amiodarone and metoprolol. At home was on chronic Xarelto which she stopped on her own. Treated with Lovenox initially now currently on hold. Cardiology following. On Coreg now as well as oral amiodarone. We will probably hold anticoagulation on discharge due to severe calf hematoma. Currently holding diuresis in the setting of hypotension.  3.  Right calf hematoma.   Acute blood loss anemia.  Macrocytosis Baseline hemoglobin 11 currently in 9 range. Discontinue Lovenox. Ultrasound soft tissue on 12/07/2018 suggest sonographic evidence of active bleeding.  Stat CT angiogram showed no extravasation of contrast on 12/07/2018. Monitor circumference and pulses.  Pain control B12 320, relatively low.  Patient was given B12 injection x1.  4.  Peripheral vascular disease. Identified on CTA. Currently monitoring.  5.  Acute kidney injury chronic kidney disease stage III. Renal function currently stable.  Baseline serum creatinine 0.7 Presents with AKI, serum creatinine of 1.4. Now serum creatinine again back to baseline.  6.  Recurrent fall. PT OT consulted. Recommend home with home health 24-hour supervision.  7.  Chronic steroid use Continue for now. Due to persistent hypoglycemic episode I will increase the steroid dose from 10 mg to 20 mg.  8.  Chronic recurrent hypoglycemia. Etiology unclear. Patient is not on any insulin or other hypoglycemic agents. Oral intake is actually adequate. Patient is also asymptomatic. Blood sugar increases adequately with oral intake and actually BMP and CBG match each other. We will check cosyntropin stimulation test tomorrow although will not change management as the patient is already on chronic steroids. I have sent out C-peptide insulin and further work-up although yield will likely be low.  Diet: Cardiac diet DVT Prophylaxis: mechanical compression device  Advance goals of care discussion: DNR DNI  Family Communication: no family was present at bedside, at the time of interview.   Disposition:  Discharge to home likely tomorrow pending improvement in blood sugars and stabilization of H&H  Consultants: cardiology, PCCM  Procedures: Echocardiogram  Scheduled Meds: . amiodarone  200 mg Oral Daily  . carvedilol  25 mg Oral BID WC  . [START ON 12/10/2018] cosyntropin  0.25 mg Intravenous Once  . docusate sodium  100 mg Oral BID  . ferrous sulfate  325 mg Oral TID WC  . latanoprost  1 drop Both  Eyes QHS  . losartan  25 mg Oral Daily  . mupirocin ointment   Topical BID  . [START ON 12/10/2018]  predniSONE  20 mg Oral Q breakfast  . sodium chloride flush  3 mL Intravenous Q12H   Continuous Infusions: PRN Meds: acetaminophen **OR** acetaminophen, dextrose, diphenhydrAMINE, HYDROcodone-acetaminophen, lidocaine, ondansetron **OR** ondansetron (ZOFRAN) IV, polyethylene glycol, polyvinyl alcohol Antibiotics: Anti-infectives (From admission, onward)   Start     Dose/Rate Route Frequency Ordered Stop   12/04/18 1300  levofloxacin (LEVAQUIN) tablet 750 mg  Status:  Discontinued     750 mg Oral Daily 12/04/18 1150 12/06/18 1334   12/03/18 0600  vancomycin (VANCOCIN) IVPB 1000 mg/200 mL premix  Status:  Discontinued     1,000 mg 200 mL/hr over 60 Minutes Intravenous Every 48 hours 12/01/18 0652 12/02/18 0836   12/02/18 1000  vancomycin (VANCOCIN) IVPB 1000 mg/200 mL premix  Status:  Discontinued     1,000 mg 200 mL/hr over 60 Minutes Intravenous Every 24 hours 12/02/18 0836 12/04/18 0713   12/02/18 0900  ceFEPIme (MAXIPIME) 2 g in sodium chloride 0.9 % 100 mL IVPB  Status:  Discontinued     2 g 200 mL/hr over 30 Minutes Intravenous Every 12 hours 12/02/18 0836 12/04/18 1150   12/02/18 0800  ceFEPIme (MAXIPIME) 2 g in sodium chloride 0.9 % 100 mL IVPB  Status:  Discontinued     2 g 200 mL/hr over 30 Minutes Intravenous Every 24 hours 12/01/18 0652 12/02/18 0836   12/01/18 0500  metroNIDAZOLE (FLAGYL) IVPB 500 mg     500 mg 100 mL/hr over 60 Minutes Intravenous  Once 12/01/18 0452 12/01/18 0603   12/01/18 0500  ceFEPIme (MAXIPIME) 2 g in sodium chloride 0.9 % 100 mL IVPB     2 g 200 mL/hr over 30 Minutes Intravenous  Once 12/01/18 0454 12/01/18 0535   12/01/18 0500  vancomycin (VANCOCIN) IVPB 1000 mg/200 mL premix     1,000 mg 200 mL/hr over 60 Minutes Intravenous  Once 12/01/18 0454 12/01/18 0608       Objective: Physical Exam: Vitals:   12/09/18 1144 12/09/18 1300 12/09/18 1625 12/09/18 1635  BP: (!) 153/122  (!) 70/58 (!) 89/50  Pulse: 74  91   Resp: 19  19   Temp: 97.9 F  (36.6 C)  98.7 F (37.1 C)   TempSrc: Oral     SpO2: 96% 100% 96% 98%  Weight:      Height:        Intake/Output Summary (Last 24 hours) at 12/09/2018 1851 Last data filed at 12/09/2018 0900 Gross per 24 hour  Intake 103 ml  Output 300 ml  Net -197 ml   Filed Weights   12/07/18 0600 12/08/18 0434 12/09/18 0446  Weight: 82.6 kg 79.9 kg 81.9 kg   General: Alert, Awake and Oriented to Time, Place and Person. Appear in mild distress, affect flat Eyes: PERRL, Conjunctiva normal ENT: Oral Mucosa clear moist Neck: positive JVD, no Abnormal Mass Or lumps Cardiovascular: S1 and S2 Present, aortic systolic  Murmur, Peripheral Pulses Present Respiratory: increased respiratory effort, Bilateral Air entry equal and Decreased, no use of accessory muscle, bilateral  Crackles, no wheezes Abdomen: Bowel Sound present, Soft and no tenderness, no hernia Skin: no redness, no Rash, no induration Extremities: bilateral  Pedal edema, right calf tenderness Neurologic: Grossly no focal neuro deficit. Bilaterally Equal motor strength  Data Reviewed: CBC: Recent Labs  Lab 12/03/18 2353  12/06/18 0606  12/07/18 2104 12/08/18 0304  12/08/18 0952 12/08/18 1504 12/08/18 2050  WBC 9.9   < > 7.4   < > 7.3 7.4 7.7 9.1 8.1  NEUTROABS 8.3*  --  5.5  --   --  5.1  --   --   --   HGB 11.8*   < > 11.2*   < > 9.7* 9.6* 9.0* 9.5* 9.5*  HCT 35.8*   < > 33.6*   < > 29.2* 29.3* 27.2* 28.8* 29.0*  MCV 102.9*   < > 102.1*   < > 103.2* 103.2* 102.3* 101.8* 103.9*  PLT 169   < > 164   < > 178 182 171 203 200   < > = values in this interval not displayed.   Basic Metabolic Panel: Recent Labs  Lab 12/04/18 0844 12/05/18 0500 12/05/18 0530 12/06/18 0606 12/07/18 0605 12/08/18 0304 12/09/18 0255 12/09/18 1500  NA 139 142  --  140 142 141 142 139  K 4.0 3.7  --  3.2* 3.6 3.3* 4.1 4.8  CL 103 105  --  105 106 103 107 103  CO2 24 26  --  27 27 28 29 27   GLUCOSE 125* 106*  --  85 80 90 78 202*  BUN 27* 34*   --  33* 31* 27* 25* 28*  CREATININE 0.89 1.03*  --  0.99 0.77 0.86 0.87 0.94  CALCIUM 8.8* 8.7*  --  8.4* 8.6* 8.2* 8.1* 8.1*  MG 2.0  --  2.0 1.9 1.9 1.8  --   --   PHOS 3.1 3.8  --  3.6 3.1 3.1  --   --     Liver Function Tests: Recent Labs  Lab 12/04/18 0844 12/05/18 0500 12/06/18 0606 12/07/18 0605 12/08/18 0304  ALBUMIN 2.7* 2.3* 2.3* 2.5* 2.3*   No results for input(s): LIPASE, AMYLASE in the last 168 hours. No results for input(s): AMMONIA in the last 168 hours. Coagulation Profile: No results for input(s): INR, PROTIME in the last 168 hours. Cardiac Enzymes: No results for input(s): CKTOTAL, CKMB, CKMBINDEX, TROPONINI in the last 168 hours. BNP (last 3 results) No results for input(s): PROBNP in the last 8760 hours. CBG: Recent Labs  Lab 12/09/18 0922 12/09/18 0943 12/09/18 1015 12/09/18 1137 12/09/18 1605  GLUCAP 53* 28* 95 77 213*   Studies: No results found.   Time spent: 35 minutes  Author: Berle Mull, MD [facility] 12/09/2018 6:51 PM  To reach On-call, see care teams to locate the attending and reach out to them via [email or web address]. If 7PM-7AM, please contact night-coverage If you still have difficulty reaching the attending provider, please page the [facility] (Director on Call) for [facility] on amion for assistance.

## 2018-12-10 ENCOUNTER — Inpatient Hospital Stay (HOSPITAL_COMMUNITY): Payer: Medicare HMO

## 2018-12-10 DIAGNOSIS — R609 Edema, unspecified: Secondary | ICD-10-CM

## 2018-12-10 LAB — BASIC METABOLIC PANEL
Anion gap: 6 (ref 5–15)
BUN: 29 mg/dL — ABNORMAL HIGH (ref 8–23)
CO2: 27 mmol/L (ref 22–32)
Calcium: 8 mg/dL — ABNORMAL LOW (ref 8.9–10.3)
Chloride: 107 mmol/L (ref 98–111)
Creatinine, Ser: 0.69 mg/dL (ref 0.44–1.00)
GFR calc Af Amer: >60 mL/min (ref >60)
GFR calc non Af Amer: >60 mL/min (ref >60)
Glucose, Bld: 89 mg/dL (ref 70–99)
Potassium: 4 mmol/L (ref 3.5–5.1)
Sodium: 140 mmol/L (ref 135–145)

## 2018-12-10 LAB — GLUCOSE, CAPILLARY
Glucose-Capillary: 117 mg/dL — ABNORMAL HIGH (ref 70–99)
Glucose-Capillary: 75 mg/dL (ref 70–99)
Glucose-Capillary: 87 mg/dL (ref 70–99)
Glucose-Capillary: 156 mg/dL — ABNORMAL HIGH (ref 70–99)

## 2018-12-10 LAB — INSULIN, RANDOM: Insulin: 31.2 u[IU]/mL — ABNORMAL HIGH (ref 2.6–24.9)

## 2018-12-10 LAB — C-PEPTIDE: C-Peptide: 7.6 ng/mL — ABNORMAL HIGH (ref 1.1–4.4)

## 2018-12-10 MED ORDER — FERROUS SULFATE 325 (65 FE) MG PO TABS: 325.0000 mg | ORAL_TABLET | Freq: Three times a day (TID) | ORAL | 0 refills | Status: DC

## 2018-12-10 MED ORDER — AMIODARONE HCL 200 MG PO TABS: 200.0000 mg | ORAL_TABLET | Freq: Every day | ORAL | 0 refills | Status: DC

## 2018-12-10 MED ORDER — IOHEXOL 300 MG/ML  SOLN
100.0000 mL | Freq: Once | INTRAMUSCULAR | Status: AC | PRN
  Administered 2018-12-10: 100 mL via INTRAVENOUS

## 2018-12-10 MED ORDER — PREDNISONE 10 MG PO TABS: 20.0000 mg | ORAL_TABLET | Freq: Every day | ORAL | 0 refills | Status: DC

## 2018-12-10 MED ORDER — FUROSEMIDE 20 MG PO TABS: 20.0000 mg | ORAL_TABLET | Freq: Every day | ORAL | 0 refills | Status: DC | PRN

## 2018-12-10 MED ORDER — SODIUM CHLORIDE 0.9% FLUSH: 10.0000 mL | INTRAVENOUS | Status: DC | PRN

## 2018-12-10 MED ORDER — DOCUSATE SODIUM 100 MG PO CAPS: 100.0000 mg | ORAL_CAPSULE | Freq: Two times a day (BID) | ORAL | 0 refills | Status: DC

## 2018-12-10 MED ORDER — SODIUM CHLORIDE 0.9% FLUSH
10.0000 mL | Freq: Two times a day (BID) | INTRAVENOUS | Status: DC
  Administered 2018-12-10: 10 mL

## 2018-12-10 MED ORDER — CARVEDILOL 25 MG PO TABS: 25.0000 mg | ORAL_TABLET | Freq: Two times a day (BID) | ORAL | 0 refills | Status: DC

## 2018-12-10 MED FILL — FERROUS SULFATE 325 MG TAB: 325 (65 FE) | 30 days supply | Qty: 90 | Fill #0

## 2018-12-10 MED FILL — CARVEDILOL 25 MG TABLET: 25 | 30 days supply | Qty: 60 | Fill #0

## 2018-12-10 MED FILL — predniSONE 10 MG TABS: 10 | 30 days supply | Qty: 60 | Fill #0

## 2018-12-10 MED FILL — DOK 100 MG CAPS: 100 | 5 days supply | Qty: 10 | Fill #0

## 2018-12-10 MED FILL — FUROSEMIDE 20 MG TAB: 20 | 30 days supply | Qty: 30 | Fill #0

## 2018-12-10 MED FILL — AMIODARONE HCL 200 MG TAB: 200 | 30 days supply | Qty: 30 | Fill #0

## 2018-12-10 NOTE — Progress Notes (Signed)
Physical Therapy Treatment Patient Details Name: Kelsey [name] MRN: [id number] DOB: 1931/10/24 Today's Date: 12/10/2018    History of Present Illness Patient is an 85 y/o female presenting to the ED on 12/01/2018 with primary complaints of fever. Past medical history significant of pulmonary HTN; HTN; HLD; vocal cord paralysis;  Diastolic dysfunction; and afib previously on Xarelto. Admitted for Shock, likely mixed septic and cardiogenic work-up.    PT Comments    Patient requesting to return to bed from chair, agreeable to ambulating in room only. S level with limited tolerance due to pain and fatigue. Cont to rec HHPT.    Follow Up Recommendations  Home health PT;Supervision/Assistance - 24 hour     Equipment Recommendations  None recommended by PT    Recommendations for Other Services       Precautions / Restrictions Precautions Precautions: Fall Restrictions Weight Bearing Restrictions: No    Mobility  Bed Mobility                  Transfers Overall transfer level: Needs assistance Equipment used: 1 person hand held assist(and held to bed rail) Transfers: Sit to/from Stand Sit to Stand: Min assist         General transfer comment: Min assist to power up from [facility]; BSC was facing bed, and [name] was able to reach for bed rail for stability while coming to stand  Ambulation/Gait Ambulation/Gait assistance: Min guard Gait Distance (Feet): 15 Feet Assistive device: Rolling walker (2 wheeled) Gait Pattern/deviations: Shuffle Gait velocity: decreased   General Gait Details: use of RW due to fatigue and pain, S level in room. requesting to return to bed   Stairs             Wheelchair Mobility    Modified Rankin (Stroke Patients Only)       Balance     Sitting balance-Leahy Scale: Fair       Standing balance-Leahy Scale: Poor Standing balance comment: states she feels like she has to use RW - encouraged this at home                             Cognition Arousal/Alertness: Awake/alert Behavior During Therapy: WFL for tasks assessed/performed Overall Cognitive Status: Within Functional Limits for tasks assessed                                        Exercises      General Comments        Pertinent Vitals/Pain Pain Assessment: Faces Faces Pain Scale: Hurts little more Pain Location: R lower leg Pain Descriptors / Indicators: Aching;Grimacing;Guarding    Home Living                      Prior Function            PT Goals (current goals can now be found in the care plan section) Acute Rehab PT Goals Patient Stated Goal: return home soon PT Goal Formulation: With patient Time For Goal Achievement: 12/18/18 Potential to Achieve Goals: Good Progress towards PT goals: Progressing toward goals    Frequency    Min 3X/week      PT Plan Current plan remains appropriate    Co-evaluation              AM-PAC PT "6 Clicks" Mobility  Outcome Measure  Help needed turning from your back to your side while in a flat bed without using bedrails?: A Little Help needed moving from lying on your back to sitting on the side of a flat bed without using bedrails?: A Little Help needed moving to and from a bed to a chair (including a wheelchair)?: A Little Help needed standing up from a chair using your arms (e.g., wheelchair or bedside chair)?: A Little Help needed to walk in hospital room?: A Little Help needed climbing 3-5 steps with a railing? : A Lot 6 Click Score: 17    End of Session Equipment Utilized During Treatment: Gait belt Activity Tolerance: Patient tolerated treatment well Patient left: in chair;with call bell/phone within reach Nurse Communication: Mobility status PT Visit Diagnosis: Unsteadiness on feet (R26.81);Other abnormalities of gait and mobility (R26.89);Muscle weakness (generalized) (M62.81)     Time: 3546-5681 PT Time Calculation  (min) (ACUTE ONLY): 10 min  Charges:  $Gait Training: 8-22 mins                     Reinaldo Berber, PT, DPT Acute Rehabilitation Services Pager: [phone] Office: [place] 12/10/2018, 11:28 AM

## 2018-12-10 NOTE — TOC Transition Note (Signed)
Transition of Care [facility]) - CM/SW Discharge Note   Patient Details  Name: Kelsey [name] MRN: [id number] Date of Birth: May 17, 1932  [facility] CM/SW Contact:  Pollie Friar, RN Phone Number: 12/10/2018, 5:06 PM   Clinical Narrative:    Pt maintained her sats at 91% on RA while ambulating so wont qualify for home oxygen MD made aware.  Blair set up with [facility] and Butch Penny with [facility] aware of d/c. Pt states her spouse will provide transportation home.   Final next level of care: [place] Barriers to Discharge: No Barriers Identified   Patient Goals and CMS Choice Patient states their goals for this hospitalization and ongoing recovery are:: get better  CMS Medicare.gov Compare Post Acute Care list provided to:: Patient Choice offered to / list presented to : Patient  Discharge Placement                       Discharge Plan and Services   Discharge Planning Services: CM Consult, Follow-up appt scheduled Post Acute Care Choice: Home Health          DME Arranged: N/A DME Agency: NA       HH Arranged: PT [place] Agency: [place] ([facility]) Date HH Agency Contacted: 12/06/18 Time [place]: [phone] Representative spoke with at [place]: [place] ([place]) Interventions     Readmission Risk Interventions Readmission Risk Prevention Plan 12/06/2018  Transportation Screening Complete  PCP or Specialist Appt within 3-5 Days Complete  HRI or [place] Complete  Social Work Consult for [place] Planning/Counseling Complete  Palliative Care Screening Not Applicable  Medication Review [employer]) Complete  Some recent data might be hidden

## 2018-12-10 NOTE — Progress Notes (Addendum)
LE venous duplex       has been completed. Preliminary results can be found under CV proc through chart review. Shaneil Yazdi, BS, RDMS, RVT   

## 2018-12-11 NOTE — Discharge Summary (Signed)
[facility] Discharge Summary   Patient: Kelsey [name] TKP:[id number]   PCP: Kelsey Cruel, MD DOB: Nov 03, 1931   Date of admission: 12/01/2018   Date of discharge: 12/10/2018     Discharge Diagnoses:  Principal Problem:   Severe sepsis [facility]) Active Problems:   Hypertension   Diastolic dysfunction   Chronic atrial fibrillation with RVR   Cardiogenic shock (HCC)   Atrial fibrillation with RVR (HCC)   Elevated troponin   Acute on chronic combined systolic and diastolic CHF (congestive heart failure) ([place])   Acute systolic heart failure ([place])   Admitted From: home Disposition:  home  Recommendations for Outpatient Follow-up:  1. Please follow-up with PCP in 1 week  Follow-up Information    Kelsey Cruel, MD. Go on 12/10/2018.   Specialty:  Family Medicine Why:  Hospital follow-up at 12:00pm with Dr Kelsey [name] information: [address] [place] [address] [phone]        [place] Follow up.   Why:  HHPT         Diet recommendation: Cardiac diet  Activity: The patient is advised to gradually reintroduce usual activities.  Discharge Condition: good  Code Status: DNR/DNI  History of present illness: As per the H and P dictated on admission, "Kelsey [name] is a 85 y.o. female with medical history significant of pulmonary HTN; HTN; HLD; vocal cord paralysis;  Diastolic dysfunction; and afib previously on Xarelto presenting with myalgias.  I spoke with her husband who reports that she woke up from a nap, she just didn't feel good and she kept getting worse. She wasn't able to move.  She was somewhat SOB.  No cough or respiratory.  No urinary complaints.  No n/v/d.  She was a little subjectively warm.  She has been off Xarelto "for a pretty good while"; she had "blood splotches on her hands" that they attributed to the Xarelto and so she stopped taking it - he thinks this was months ago.  The patient describes  generalized myalgias preventing her from getting out of bed.  She has spinal stenosis and is normally sedentary but is able to move with a walker.  She was admitted in 11/19 in [facility] with a similar presentation.  She was unable to get out of bed and was found to have sepsis with WBC 18,000, temp 102, and lactate >3.  She was found to have Strep bacteremia and an E coli UTI.  She was treated with home Rocephin for 4 weeks.  She has a h/o refractory PAF which was controlled on metoprolol.  Her Xarelto was resumed at the time of d/c.  "  Hospital Course:  Summary of her active problems in the hospital is as following. 1. Septic shock-resolved Sepsis POA unknown etiology COVID negative Influenza respiratory virus pathogen panel negative Blood cultures no growth till date. Chest x-ray clear. Urine culture no growth. Treated with IV fluids.  IV cefepime and IV vancomycin. Completed 7 days of antibiotic course so far. Leukocytosis improved.  No further fever.  2.  A. fib with RVR.  Refractory chronic Cardiogenic shock. Acute systolic CHF combined with chronic diastolic CHF. Acute hypoxic respiratory failure Essential hypertension Systolic dysfunction likely secondary to anemia, tachycardia induced cardiomyopathy.  Treated with IV amiodarone and metoprolol. At home was on chronic Xarelto which she stopped on her own. Treated with Lovenox initially now currently on hold. Cardiology following. On Coreg now as well as oral amiodarone. We will probably hold  anticoagulation on discharge due to severe calf hematoma. Currently holding diuresis in the setting of hypotension.  Use PRN.  Hold losartan on discharge.  3.  Right calf hematoma.   Acute blood loss anemia.  Macrocytosis Baseline hemoglobin 11 currently in 9 range. Discontinue Lovenox. Ultrasound soft tissue on 12/07/2018 suggest sonographic evidence of active bleeding. Stat CT angiogram showed no extravasation of contrast on  12/07/2018. Monitor circumference and pulses.  Pain control B12 320, relatively low.  Patient was given B12 injection x1.  4.  Peripheral vascular disease. Identified on CTA. Currently monitoring.  5.  Acute kidney injury chronic kidney disease stage III. Renal function currently stable.  Baseline serum creatinine 0.7 Presents with AKI, serum creatinine of 1.4. Now serum creatinine again back to baseline.  6.  Recurrent fall. PT OT consulted. Recommend home with home health 24-hour supervision.  7.  Chronic steroid use Continue for now. Due to persistent hypoglycemic episode I will increase the steroid dose from 10 mg to 20 mg.  8.  Chronic recurrent hypoglycemia. Etiology unclear. Patient is not on any insulin or other hypoglycemic agents. Oral intake is actually adequate. Patient is also asymptomatic. Blood sugar increases adequately with oral intake and actually BMP and CBG match each other. When the patient blood sugar increased to 200 cortisol level was 7.1, insulin level is 31.2, C-peptide was 7.6.  CT abdomen pelvis is negative for any evidence of insulinoma.  No further work-up for now.  9.  Incidental right pulmonary infarct. Further work-up was hypoglycemia with CT abdomen showed right lower lobe infarct with pulmonary embolism. Lower extremity Doppler was negative for any DVT. Patient still has a contraindication for anticoagulation. Currently asymptomatic with PE, not on oxygen.  Therefore will not be pursuing any anticoagulation or other treatment for now.  No indicated IVC filter placement given no evidence of DVT in lower extremity.   Patient was seen by physical therapy, who recommended home health, which was arranged by case manager. On the day of the discharge the patient's vitals were stable , and no other acute medical condition were reported by patient. the patient was felt safe to be discharge at home with home health.  Consultants: cardiology, PCCM    Procedures: Echocardiogram  DISCHARGE MEDICATION: Allergies as of 12/10/2018      Reactions   Cardizem [diltiazem Hcl] Swelling   Clonidine Derivatives Swelling   Doxazosin Swelling      Medication List    STOP taking these medications   metoprolol tartrate 50 MG tablet Commonly known as:  LOPRESSOR     TAKE these medications   amiodarone 200 MG tablet Commonly known as:  PACERONE Take 1 tablet (200 mg total) by mouth daily.   carvedilol 25 MG tablet Commonly known as:  COREG Take 1 tablet (25 mg total) by mouth 2 (two) times daily with a meal.   docusate sodium 100 MG capsule Commonly known as:  COLACE Take 1 capsule (100 mg total) by mouth 2 (two) times daily.   ferrous sulfate 325 (65 FE) MG tablet Take 1 tablet (325 mg total) by mouth 3 (three) times daily with meals.   furosemide 20 MG tablet Commonly known as:  Lasix Take 1 tablet (20 mg total) by mouth daily as needed for fluid or edema (weight gain>3Lbs in 1 day). What changed:    medication strength  how much to take  reasons to take this   HYDROcodone-acetaminophen 5-325 MG tablet Commonly known as:  NORCO/VICODIN Take 1 tablet  by mouth every 6 (six) hours as needed for moderate pain.   hydroxypropyl methylcellulose / hypromellose 2.5 % ophthalmic solution Commonly known as:  ISOPTO TEARS / GONIOVISC Place 1 drop into both eyes 4 (four) times daily as needed for dry eyes.   latanoprost 0.005 % ophthalmic solution Commonly known as:  XALATAN Place 1 drop into both eyes at bedtime.   neomycin-bacitracin-polymyxin ointment Commonly known as:  NEOSPORIN Apply 1 application topically as needed for wound care.   predniSONE 10 MG tablet Commonly known as:  DELTASONE Take 2 tablets (20 mg total) by mouth daily with breakfast. What changed:  how much to take      Allergies  Allergen Reactions   Cardizem [Diltiazem Hcl] Swelling   Clonidine Derivatives Swelling   Doxazosin Swelling    Discharge Instructions    Diet - low sodium heart healthy   Complete by:  As directed    Discharge instructions   Complete by:  As directed    It is important that you read the given instructions as well as go over your medication list with RN to help you understand your care after this hospitalization.  Discharge Instructions: Please follow-up with PCP in 1-2 weeks  Please request your primary care physician to go over all Hospital Tests and Procedure/Radiological results at the follow up. Please get all Hospital records sent to your PCP by signing hospital release before you go home.   Do not take more than prescribed Pain, Sleep and Anxiety Medications. You were cared for by a hospitalist during your hospital stay. If you have any questions about your discharge medications or the care you received while you were in the hospital after you are discharged, you can call the unit @UNIT @ you were admitted to and ask to speak with the hospitalist on call if the hospitalist that took care of you is not available.  Once you are discharged, your primary care physician will handle any further medical issues. Please note that NO REFILLS for any discharge medications will be authorized once you are discharged, as it is imperative that you return to your primary care physician (or establish a relationship with a primary care physician if you do not have one) for your aftercare needs so that they can reassess your need for medications and monitor your lab values. You Must read complete instructions/literature along with all the possible adverse reactions/side effects for all the Medicines you take and that have been prescribed to you. Take any new Medicines after you have completely understood and accept all the possible adverse reactions/side effects.   Increase activity slowly   Complete by:  As directed      Discharge Exam: Filed Weights   12/08/18 0434 12/09/18 0446 12/10/18 0327  Weight: 79.9  kg 81.9 kg 80.6 kg   Vitals:   12/10/18 1652 12/10/18 1657  BP:  96/63  Pulse:  (!) 44  Resp:  18  Temp:  97.8 F (36.6 C)  SpO2: 96% 93%   General: Appear in no distress, no Rash; Oral Mucosa moist. Cardiovascular: S1 and S2 Present, no Murmur, no JVD Respiratory: Bilateral Air entry present and Clear to Auscultation, no Crackles, no wheezes Abdomen: Bowel Sound present, Soft and no tenderness Extremities: no Pedal edema, no calf tenderness Neurology: Grossly no focal neuro deficit.  The results of significant diagnostics from this hospitalization (including imaging, microbiology, ancillary and laboratory) are listed below for reference.    Significant Diagnostic Studies: Ct Angio Low Extrem Right  W &/or Wo Contrast  Addendum Date: 12/07/2018   ADDENDUM REPORT: 12/07/2018 22:49 ADDENDUM: Study discussed by telephone with NP XENIA BLOUNT on 12/07/2018 at 1043 hours. Electronically Signed   By: Genevie Ann M.D.   On: 12/07/2018 22:49   Result Date: 12/07/2018 CLINICAL DATA:  85 year old female with right medial calf hematoma after blunt trauma. Possible active bleeding on ultrasound earlier today. EXAM: CT ANGIOGRAPHY OF THE RIGHT LOWEREXTREMITY TECHNIQUE: Multidetector CT imaging of the right lowerwas performed using the standard protocol during bolus administration of intravenous contrast. Multiplanar CT image reconstructions and MIPs were obtained to evaluate the vascular anatomy. CONTRAST:  162m OMNIPAQUE IOHEXOL 350 MG/ML SOLN COMPARISON:  Right lower extremity ultrasound earlier today. FINDINGS: RIGHT LOWER EXTREMITY: Large superficial mixed density collection with a dependent hematocrit level (series 5, image 416) encompasses 64 x 133 x 144 millimeters (AP by transverse by CC) for an estimated hematoma volume of 613 milliliters. See also series 8, image 89. Superimposed right lower extremity subcutaneous stranding. No superimposed acute osseous abnormality identified. Right hip  arthroplasty with streak artifact in the right pelvis and proximal right lower extremity. No knee joint effusion. No soft tissue gas. VASCULAR: Aortoiliac calcified atherosclerosis. The visible bilateral iliac arteries are patent. There is no right iliac stenosis. The right common femoral artery is patent with calcified plaque, but the right SFA appears occluded just beyond the bifurcation (series 5, image 128). Calcified plaque continues in the SFA and the right profundus femoral artery. The right profundal remains patent (series 5, image 170). The SFA remains occluded until reconstituted just cephalad of the popliteal fossa on image 232. Popliteal artery atherosclerosis with at least moderate stenosis (image 276). Superimposed calcified peripheral vascular disease with 2 vessel runoff, the posterior tibial artery is occluded just beyond its origin on series 5, image 376. There is no active extravasation of contrast identified. Review of the MIP images confirms the above findings. NONVASCULAR: Negative visible lower abdominal and pelvic viscera. IMPRESSION: 1. Large superficial hematoma in the right calf with estimated blood volume 613 mL. No active extravasation of contrast identified. No associated acute osseous abnormality. 2. Occlusion of the Right SFA just beyond the bifurcation with reconstituted right popliteal artery. Calcified peripheral vascular disease with two vessel runoff, occluded right posterior tibial artery. Electronically Signed: By: HGenevie AnnM.D. On: 12/07/2018 22:32   Ct Abdomen Pelvis W Contrast  Result Date: 12/10/2018 CLINICAL DATA:  Evaluate insulinoma EXAM: CT ABDOMEN AND PELVIS WITH CONTRAST TECHNIQUE: Multidetector CT imaging of the abdomen and pelvis was performed using the standard protocol following bolus administration of intravenous contrast. CONTRAST:  1013mOMNIPAQUE IOHEXOL 300 MG/ML SOLN, oral enteric contrast COMPARISON:  05/27/2018 FINDINGS: Lower chest: Bibasilar scarring  and/or atelectasis. There is a subpleural, rounded opacity of the right lung base with proximal subsegmental pulmonary embolus (series 3, image 8). Cardiomegaly. Hepatobiliary: No focal liver abnormality is seen. Diffuse parenchymal calcifications in keeping with prior granulomatous infection. Contracted gallbladder. No gallstones, or biliary dilatation. Pancreas: Unremarkable. No pancreatic ductal dilatation or surrounding inflammatory changes. Spleen: Normal in size without focal abnormality. Diffuse parenchymal calcifications in keeping with prior granulomatous infection. Adrenals/Urinary Tract: Adrenal glands are unremarkable. Kidneys are normal, without renal calculi, focal lesion, or hydronephrosis. Bladder is unremarkable. Stomach/Bowel: Stomach is within normal limits. Appendix appears normal. No evidence of bowel wall thickening, distention, or inflammatory changes. Vascular/Lymphatic: Mixed calcific atherosclerosis. No enlarged abdominal or pelvic lymph nodes. Reproductive: No mass or other abnormality. Other: Small fat containing left-sided spigelian hernia (  series 3, image 68). No abdominopelvic ascites. Musculoskeletal: Osteopenia. Status post bilateral total hip arthroplasty. IMPRESSION: 1. There is no pancreatic lesion or other finding suspicious for insulinoma on single phase CT examination of the abdomen or pelvis. Multiphasic contrast enhanced MRI is the test of choice for detection insulinoma. 2. There is a subpleural, rounded opacity of the right lung base with proximal subsegmental pulmonary embolus (series 3, image 8). Findings are consistent with incidental pulmonary embolism and subsegmental infarction. Consider dedicated CT PE protocol examination of the chest for further evaluation of pulmonary embolus burden. 3.  Other chronic and incidental findings as detailed above. These results will be called to the ordering clinician or representative by the Radiologist Assistant, and communication  documented in the PACS or zVision Dashboard. Electronically Signed   By: Eddie Candle M.D.   On: 12/10/2018 13:26   Dg Chest Port 1 View  Result Date: 12/01/2018 CLINICAL DATA:  84 year old female with shortness of breath. EXAM: PORTABLE CHEST 1 VIEW COMPARISON:  Chest radiograph dated 05/27/2018 FINDINGS: There is diffuse interstitial coarsening and chronic bronchitic changes. No focal consolidation, pleural effusion, or pneumothorax. Stable moderate cardiomegaly. Atherosclerotic calcification of the aortic arch. No acute osseous pathology. Bilateral breast implants. IMPRESSION: 1. No acute cardiopulmonary process. 2. Stable cardiomegaly. Electronically Signed   By: Anner Crete M.D.   On: 12/01/2018 03:38   [place] Rt Lower Extrem Ltd Soft Tissue Non Vascular  Addendum Date: 12/07/2018   ADDENDUM REPORT: 12/07/2018 20:30 ADDENDUM: These results were called by telephone at the time of interpretation on 12/07/2018 at 8:30 pm to Dr. Kennon Holter , who verbally acknowledged these results. Electronically Signed   By: Constance Holster M.D.   On: 12/07/2018 20:30   Result Date: 12/07/2018 CLINICAL DATA:  Hematoma EXAM: ULTRASOUND right LOWER EXTREMITY LIMITED TECHNIQUE: Ultrasound examination of the lower extremity soft tissues was performed in the area of clinical concern. COMPARISON:  None. FINDINGS: There is a large 6.3 x 3.4 cm collection in the right medial calf. There are fluid fluid levels within this collection. On the Doppler images, there appears to be evidence of active bleeding into the hematoma. IMPRESSION: Large right lower extremity hematoma with sonographic evidence of active bleeding. Report to be addended once critical findings reported to the ordering physician. Electronically Signed: By: Constance Holster M.D. On: 12/07/2018 20:01   Vas [place] Lower Extremity Venous (dvt)  Result Date: 12/10/2018  Lower Venous Study Indications: Swelling.  Risk Factors: Confirmed PE. Limitations: Patient  intolerance to compressions. Performing Technologist: June Leap RDMS, RVT  Examination Guidelines: A complete evaluation includes B-mode imaging, spectral Doppler, color Doppler, and power Doppler as needed of all accessible portions of each vessel. Bilateral testing is considered an integral part of a complete examination. Limited examinations for reoccurring indications may be performed as noted.  +---------+---------------+---------+-----------+----------+--------------+  RIGHT     Compressibility Phasicity Spontaneity Properties Summary         +---------+---------------+---------+-----------+----------+--------------+  CFV       Full            Yes       Yes                                    +---------+---------------+---------+-----------+----------+--------------+  SFJ       Full                                                             +---------+---------------+---------+-----------+----------+--------------+  FV Prox   Full                                                             +---------+---------------+---------+-----------+----------+--------------+  FV Mid    Full                                                             +---------+---------------+---------+-----------+----------+--------------+  FV Distal Full                                                             +---------+---------------+---------+-----------+----------+--------------+  PFV       Full                                                             +---------+---------------+---------+-----------+----------+--------------+  POP                       Yes       Yes                                    +---------+---------------+---------+-----------+----------+--------------+  PTV                                                        Not visualized  +---------+---------------+---------+-----------+----------+--------------+  PERO                                                       Not visualized   +---------+---------------+---------+-----------+----------+--------------+   +---------+---------------+---------+-----------+----------+--------------+  LEFT      Compressibility Phasicity Spontaneity Properties Summary         +---------+---------------+---------+-----------+----------+--------------+  CFV       Full            Yes       Yes                                    +---------+---------------+---------+-----------+----------+--------------+  SFJ       Full                                                             +---------+---------------+---------+-----------+----------+--------------+  FV Prox   Full                                                             +---------+---------------+---------+-----------+----------+--------------+  FV Mid    Full                                                             +---------+---------------+---------+-----------+----------+--------------+  FV Distal Full                                                             +---------+---------------+---------+-----------+----------+--------------+  PFV       Full                                                             +---------+---------------+---------+-----------+----------+--------------+  POP                       Yes       Yes                                    +---------+---------------+---------+-----------+----------+--------------+  PTV                                                        Not visualized  +---------+---------------+---------+-----------+----------+--------------+  PERO                                                       Not visualized  +---------+---------------+---------+-----------+----------+--------------+     Summary: Right: There is no evidence of deep vein thrombosis in the lower extremity. However, portions of this examination were limited- see technologist comments above. Left: There is no evidence of deep vein thrombosis in the lower extremity. However, portions of this  examination were limited- see technologist comments above.  *See table(s) above for measurements and observations. Electronically signed by Deitra Mayo MD on 12/10/2018 at 3:43:29 PM.    Final    Irpicc Placement Left >5 Yrs Inc Img Guide  Result Date: 12/03/2018 INDICATION: Poor venous access. In need of durable intravenous access for medication administration and blood draws while hospitalized. EXAM: ULTRASOUND AND FLUOROSCOPIC GUIDED PICC LINE INSERTION MEDICATIONS: None. CONTRAST:  None FLUOROSCOPY TIME:  18 seconds (0.7 mGy) COMPLICATIONS: None immediate. TECHNIQUE: The procedure, risks, benefits, and alternatives were explained  to the patient and informed written consent was obtained. A timeout was performed prior to the initiation of the procedure. The right upper extremity was prepped with chlorhexidine in a sterile fashion, and a sterile drape was applied covering the operative field. Maximum barrier sterile technique with sterile gowns and gloves were used for the procedure. A timeout was performed prior to the initiation of the procedure. Local anesthesia was provided with 1% lidocaine. Under direct ultrasound guidance, the basilic vein was accessed with a micropuncture kit after the overlying soft tissues were anesthetized with 1% lidocaine. After the overlying soft tissues were anesthetized, a small venotomy incision was created and a micropuncture kit was utilized to access the right basilic vein. Real-time ultrasound guidance was utilized for vascular access including the acquisition of a permanent ultrasound image documenting patency of the accessed vessel. A guidewire was advanced to the level of the superior caval-atrial junction for measurement purposes and the PICC line was cut to length. A peel-away sheath was placed and a 38 cm, 5 [place], dual lumen was inserted to level of the superior caval-atrial junction. A post procedure spot fluoroscopic was obtained. The catheter easily  aspirated and flushed and was sutured in place. A dressing was placed. The patient tolerated the procedure well without immediate post procedural complication. FINDINGS: After catheter placement, the tip lies within the superior cavoatrial junction. The catheter aspirates and flushes normally and is ready for immediate use. IMPRESSION: Successful ultrasound and fluoroscopic guided placement of a right basilic vein approach, 38 cm, 5 French, dual lumen PICC with tip at the superior caval-atrial junction. The PICC line is ready for immediate use. Electronically Signed   By: Sandi Mariscal M.D.   On: 12/03/2018 13:45   [place] Ekg Site Rite  Result Date: 12/02/2018 If Site Rite image not attached, placement could not be confirmed due to current cardiac rhythm.   Microbiology: Recent Results (from the past 240 hour(s))  MRSA PCR Screening     Status: None   Collection Time: 12/01/18  4:43 PM  Result Value Ref Range Status   MRSA by PCR NEGATIVE NEGATIVE Final    Comment:        The GeneXpert MRSA Assay (FDA approved for NASAL specimens only), is one component of a comprehensive MRSA colonization surveillance program. It is not intended to diagnose MRSA infection nor to guide or monitor treatment for MRSA infections. Performed at [facility], [place] [address], [place], [place] [address]      Labs: CBC: Recent Labs  Lab 12/06/18 0606  12/07/18 2104 12/08/18 0304 12/08/18 0952 12/08/18 1504 12/08/18 2050  WBC 7.4   < > 7.3 7.4 7.7 9.1 8.1  NEUTROABS 5.5  --   --  5.1  --   --   --   HGB 11.2*   < > 9.7* 9.6* 9.0* 9.5* 9.5*  HCT 33.6*   < > 29.2* 29.3* 27.2* 28.8* 29.0*  MCV 102.1*   < > 103.2* 103.2* 102.3* 101.8* 103.9*  PLT 164   < > 178 182 171 203 200   < > = values in this interval not displayed.   Basic Metabolic Panel: Recent Labs  Lab 12/05/18 0500 12/05/18 0530 12/06/18 0606 12/07/18 0605 12/08/18 0304 12/09/18 0255 12/09/18 1500 12/10/18 0618  NA 142  --  140  142 141 142 139 140  K 3.7  --  3.2* 3.6 3.3* 4.1 4.8 4.0  CL 105  --  105 106 103 107 103  107  CO2 26  --  27 27 28 29 27 27   GLUCOSE 106*  --  85 80 90 78 202* 89  BUN 34*  --  33* 31* 27* 25* 28* 29*  CREATININE 1.03*  --  0.99 0.77 0.86 0.87 0.94 0.69  CALCIUM 8.7*  --  8.4* 8.6* 8.2* 8.1* 8.1* 8.0*  MG  --  2.0 1.9 1.9 1.8  --   --   --   PHOS 3.8  --  3.6 3.1 3.1  --   --   --    Liver Function Tests: Recent Labs  Lab 12/05/18 0500 12/06/18 0606 12/07/18 0605 12/08/18 0304  ALBUMIN 2.3* 2.3* 2.5* 2.3*   No results for input(s): LIPASE, AMYLASE in the last 168 hours. No results for input(s): AMMONIA in the last 168 hours. Cardiac Enzymes: No results for input(s): CKTOTAL, CKMB, CKMBINDEX, TROPONINI in the last 168 hours. BNP (last 3 results) Recent Labs    12/01/18 0317  BNP 1,554.3*   CBG: Recent Labs  Lab 12/09/18 2336 12/10/18 0331 12/10/18 0733 12/10/18 1141 12/10/18 1651  GLUCAP 176* 117* 75 87 156*   Time spent: 35 minutes  Signed:  Berle Mull  [facility] 12/10/2018

## 2018-12-14 LAB — INSULIN ANTIBODIES, BLOOD: Insulin Antibodies, Human: <5 uU/mL

## 2018-12-15 DIAGNOSIS — I5043 Acute on chronic combined systolic (congestive) and diastolic (congestive) heart failure: Secondary | ICD-10-CM | POA: Diagnosis not present

## 2018-12-15 DIAGNOSIS — S8011XD Contusion of right lower leg, subsequent encounter: Secondary | ICD-10-CM | POA: Diagnosis not present

## 2018-12-15 DIAGNOSIS — L989 Disorder of the skin and subcutaneous tissue, unspecified: Secondary | ICD-10-CM | POA: Diagnosis not present

## 2018-12-15 DIAGNOSIS — D62 Acute posthemorrhagic anemia: Secondary | ICD-10-CM | POA: Diagnosis not present

## 2018-12-15 DIAGNOSIS — T45515D Adverse effect of anticoagulants, subsequent encounter: Secondary | ICD-10-CM | POA: Diagnosis not present

## 2018-12-15 DIAGNOSIS — I739 Peripheral vascular disease, unspecified: Secondary | ICD-10-CM | POA: Diagnosis not present

## 2018-12-15 DIAGNOSIS — I130 Hypertensive heart and chronic kidney disease with heart failure and stage 1 through stage 4 chronic kidney disease, or unspecified chronic kidney disease: Secondary | ICD-10-CM | POA: Diagnosis not present

## 2018-12-15 DIAGNOSIS — E162 Hypoglycemia, unspecified: Secondary | ICD-10-CM | POA: Diagnosis not present

## 2018-12-15 DIAGNOSIS — I482 Chronic atrial fibrillation, unspecified: Secondary | ICD-10-CM | POA: Diagnosis not present

## 2018-12-15 DIAGNOSIS — N183 Chronic kidney disease, stage 3 (moderate): Secondary | ICD-10-CM | POA: Diagnosis not present

## 2018-12-16 DIAGNOSIS — I509 Heart failure, unspecified: Secondary | ICD-10-CM | POA: Diagnosis not present

## 2018-12-16 DIAGNOSIS — T148XXD Other injury of unspecified body region, subsequent encounter: Secondary | ICD-10-CM | POA: Diagnosis not present

## 2018-12-16 DIAGNOSIS — A419 Sepsis, unspecified organism: Secondary | ICD-10-CM | POA: Diagnosis not present

## 2018-12-16 DIAGNOSIS — Z09 Encounter for follow-up examination after completed treatment for conditions other than malignant neoplasm: Secondary | ICD-10-CM | POA: Diagnosis not present

## 2018-12-16 DIAGNOSIS — M353 Polymyalgia rheumatica: Secondary | ICD-10-CM | POA: Diagnosis not present

## 2018-12-17 DIAGNOSIS — D62 Acute posthemorrhagic anemia: Secondary | ICD-10-CM | POA: Diagnosis not present

## 2018-12-17 DIAGNOSIS — S8011XD Contusion of right lower leg, subsequent encounter: Secondary | ICD-10-CM | POA: Diagnosis not present

## 2018-12-17 DIAGNOSIS — I482 Chronic atrial fibrillation, unspecified: Secondary | ICD-10-CM | POA: Diagnosis not present

## 2018-12-17 DIAGNOSIS — I5043 Acute on chronic combined systolic (congestive) and diastolic (congestive) heart failure: Secondary | ICD-10-CM | POA: Diagnosis not present

## 2018-12-17 DIAGNOSIS — N183 Chronic kidney disease, stage 3 (moderate): Secondary | ICD-10-CM | POA: Diagnosis not present

## 2018-12-17 DIAGNOSIS — T45515D Adverse effect of anticoagulants, subsequent encounter: Secondary | ICD-10-CM | POA: Diagnosis not present

## 2018-12-17 DIAGNOSIS — L989 Disorder of the skin and subcutaneous tissue, unspecified: Secondary | ICD-10-CM | POA: Diagnosis not present

## 2018-12-17 DIAGNOSIS — E162 Hypoglycemia, unspecified: Secondary | ICD-10-CM | POA: Diagnosis not present

## 2018-12-17 DIAGNOSIS — I739 Peripheral vascular disease, unspecified: Secondary | ICD-10-CM | POA: Diagnosis not present

## 2018-12-17 DIAGNOSIS — I130 Hypertensive heart and chronic kidney disease with heart failure and stage 1 through stage 4 chronic kidney disease, or unspecified chronic kidney disease: Secondary | ICD-10-CM | POA: Diagnosis not present

## 2018-12-19 ENCOUNTER — Encounter (HOSPITAL_COMMUNITY): Payer: Self-pay | Admitting: Emergency Medicine

## 2018-12-19 ENCOUNTER — Other Ambulatory Visit: Payer: Self-pay

## 2018-12-19 ENCOUNTER — Inpatient Hospital Stay (HOSPITAL_COMMUNITY)
Admission: EM | Admit: 2018-12-19 | Discharge: 2018-12-27 | DRG: 166 | Disposition: A | Discharge status: Hospice | Payer: Medicare HMO | Attending: Internal Medicine | Admitting: Internal Medicine

## 2018-12-19 ENCOUNTER — Emergency Department (HOSPITAL_COMMUNITY): Payer: Medicare HMO

## 2018-12-19 DIAGNOSIS — R0902 Hypoxemia: Secondary | ICD-10-CM | POA: Diagnosis not present

## 2018-12-19 DIAGNOSIS — N183 Chronic kidney disease, stage 3 (moderate): Secondary | ICD-10-CM | POA: Diagnosis not present

## 2018-12-19 DIAGNOSIS — Z820 Family history of epilepsy and other diseases of the nervous system: Secondary | ICD-10-CM

## 2018-12-19 DIAGNOSIS — R069 Unspecified abnormalities of breathing: Secondary | ICD-10-CM | POA: Diagnosis not present

## 2018-12-19 DIAGNOSIS — Z87891 Personal history of nicotine dependence: Secondary | ICD-10-CM

## 2018-12-19 DIAGNOSIS — I110 Hypertensive heart disease with heart failure: Secondary | ICD-10-CM | POA: Diagnosis present

## 2018-12-19 DIAGNOSIS — Z1159 Encounter for screening for other viral diseases: Secondary | ICD-10-CM

## 2018-12-19 DIAGNOSIS — I5043 Acute on chronic combined systolic (congestive) and diastolic (congestive) heart failure: Secondary | ICD-10-CM | POA: Diagnosis present

## 2018-12-19 DIAGNOSIS — I5022 Chronic systolic (congestive) heart failure: Secondary | ICD-10-CM | POA: Diagnosis not present

## 2018-12-19 DIAGNOSIS — Z79899 Other long term (current) drug therapy: Secondary | ICD-10-CM

## 2018-12-19 DIAGNOSIS — Z66 Do not resuscitate: Secondary | ICD-10-CM | POA: Diagnosis present

## 2018-12-19 DIAGNOSIS — I4891 Unspecified atrial fibrillation: Secondary | ICD-10-CM | POA: Diagnosis not present

## 2018-12-19 DIAGNOSIS — E785 Hyperlipidemia, unspecified: Secondary | ICD-10-CM | POA: Diagnosis present

## 2018-12-19 DIAGNOSIS — I2510 Atherosclerotic heart disease of native coronary artery without angina pectoris: Secondary | ICD-10-CM | POA: Diagnosis present

## 2018-12-19 DIAGNOSIS — S8010XA Contusion of unspecified lower leg, initial encounter: Secondary | ICD-10-CM

## 2018-12-19 DIAGNOSIS — I700 Atherosclerosis of aorta: Secondary | ICD-10-CM | POA: Diagnosis not present

## 2018-12-19 DIAGNOSIS — I482 Chronic atrial fibrillation, unspecified: Secondary | ICD-10-CM | POA: Diagnosis not present

## 2018-12-19 DIAGNOSIS — I130 Hypertensive heart and chronic kidney disease with heart failure and stage 1 through stage 4 chronic kidney disease, or unspecified chronic kidney disease: Secondary | ICD-10-CM | POA: Diagnosis not present

## 2018-12-19 DIAGNOSIS — Z96643 Presence of artificial hip joint, bilateral: Secondary | ICD-10-CM | POA: Diagnosis present

## 2018-12-19 DIAGNOSIS — S8011XD Contusion of right lower leg, subsequent encounter: Secondary | ICD-10-CM

## 2018-12-19 DIAGNOSIS — Z7901 Long term (current) use of anticoagulants: Secondary | ICD-10-CM

## 2018-12-19 DIAGNOSIS — I743 Embolism and thrombosis of arteries of the lower extremities: Secondary | ICD-10-CM | POA: Diagnosis present

## 2018-12-19 DIAGNOSIS — E162 Hypoglycemia, unspecified: Secondary | ICD-10-CM | POA: Diagnosis not present

## 2018-12-19 DIAGNOSIS — J3800 Paralysis of vocal cords and larynx, unspecified: Secondary | ICD-10-CM | POA: Diagnosis present

## 2018-12-19 DIAGNOSIS — Z7952 Long term (current) use of systemic steroids: Secondary | ICD-10-CM

## 2018-12-19 DIAGNOSIS — R000 Tachycardia, unspecified: Secondary | ICD-10-CM | POA: Diagnosis not present

## 2018-12-19 DIAGNOSIS — Z7189 Other specified counseling: Secondary | ICD-10-CM

## 2018-12-19 DIAGNOSIS — Z888 Allergy status to other drugs, medicaments and biological substances status: Secondary | ICD-10-CM

## 2018-12-19 DIAGNOSIS — J90 Pleural effusion, not elsewhere classified: Secondary | ICD-10-CM | POA: Diagnosis not present

## 2018-12-19 DIAGNOSIS — D62 Acute posthemorrhagic anemia: Secondary | ICD-10-CM | POA: Diagnosis present

## 2018-12-19 DIAGNOSIS — J9601 Acute respiratory failure with hypoxia: Secondary | ICD-10-CM | POA: Diagnosis present

## 2018-12-19 DIAGNOSIS — L989 Disorder of the skin and subcutaneous tissue, unspecified: Secondary | ICD-10-CM | POA: Diagnosis not present

## 2018-12-19 DIAGNOSIS — R0602 Shortness of breath: Secondary | ICD-10-CM | POA: Diagnosis not present

## 2018-12-19 DIAGNOSIS — I10 Essential (primary) hypertension: Secondary | ICD-10-CM | POA: Diagnosis present

## 2018-12-19 DIAGNOSIS — J439 Emphysema, unspecified: Secondary | ICD-10-CM | POA: Diagnosis not present

## 2018-12-19 DIAGNOSIS — M7989 Other specified soft tissue disorders: Secondary | ICD-10-CM | POA: Diagnosis not present

## 2018-12-19 DIAGNOSIS — M549 Dorsalgia, unspecified: Secondary | ICD-10-CM | POA: Diagnosis present

## 2018-12-19 DIAGNOSIS — I2699 Other pulmonary embolism without acute cor pulmonale: Secondary | ICD-10-CM | POA: Diagnosis not present

## 2018-12-19 DIAGNOSIS — I70201 Unspecified atherosclerosis of native arteries of extremities, right leg: Secondary | ICD-10-CM | POA: Diagnosis present

## 2018-12-19 DIAGNOSIS — Z515 Encounter for palliative care: Secondary | ICD-10-CM

## 2018-12-19 DIAGNOSIS — T45515D Adverse effect of anticoagulants, subsequent encounter: Secondary | ICD-10-CM | POA: Diagnosis not present

## 2018-12-19 DIAGNOSIS — Z85828 Personal history of other malignant neoplasm of skin: Secondary | ICD-10-CM

## 2018-12-19 DIAGNOSIS — I4819 Other persistent atrial fibrillation: Secondary | ICD-10-CM | POA: Diagnosis present

## 2018-12-19 DIAGNOSIS — I739 Peripheral vascular disease, unspecified: Secondary | ICD-10-CM | POA: Diagnosis not present

## 2018-12-19 DIAGNOSIS — A419 Sepsis, unspecified organism: Secondary | ICD-10-CM

## 2018-12-19 DIAGNOSIS — I272 Pulmonary hypertension, unspecified: Secondary | ICD-10-CM | POA: Diagnosis present

## 2018-12-19 DIAGNOSIS — G8929 Other chronic pain: Secondary | ICD-10-CM | POA: Diagnosis present

## 2018-12-19 LAB — CBC WITH DIFFERENTIAL/PLATELET
Abs Immature Granulocytes: 0.37 10*3/uL — ABNORMAL HIGH (ref 0.00–0.07)
Basophils Absolute: 0.1 10*3/uL (ref 0.0–0.1)
Basophils Relative: 1 %
Eosinophils Absolute: 0.1 10*3/uL (ref 0.0–0.5)
Eosinophils Relative: 1 %
HCT: 33.7 % — ABNORMAL LOW (ref 36.0–46.0)
Hemoglobin: 10.1 g/dL — ABNORMAL LOW (ref 12.0–15.0)
Immature Granulocytes: 4 %
Lymphocytes Relative: 22 %
Lymphs Abs: 2.1 10*3/uL (ref 0.7–4.0)
MCH: 33.7 pg (ref 26.0–34.0)
MCHC: 30 g/dL (ref 30.0–36.0)
MCV: 112.3 fL — ABNORMAL HIGH (ref 80.0–100.0)
Monocytes Absolute: 0.6 10*3/uL (ref 0.1–1.0)
Monocytes Relative: 6 %
Neutro Abs: 6.2 10*3/uL (ref 1.7–7.7)
Neutrophils Relative %: 66 %
Platelets: 298 10*3/uL (ref 150–400)
RBC: 3 MIL/uL — ABNORMAL LOW (ref 3.87–5.11)
RDW: 17.1 % — ABNORMAL HIGH (ref 11.5–15.5)
WBC: 9.4 10*3/uL (ref 4.0–10.5)
nRBC: 1.1 % — ABNORMAL HIGH (ref 0.0–0.2)

## 2018-12-19 LAB — HEPARIN LEVEL (UNFRACTIONATED): Heparin Unfractionated: 0.46 IU/mL (ref 0.30–0.70)

## 2018-12-19 LAB — HEMOGLOBIN AND HEMATOCRIT, BLOOD
HCT: 29.6 % — ABNORMAL LOW (ref 36.0–46.0)
Hemoglobin: 9.1 g/dL — ABNORMAL LOW (ref 12.0–15.0)

## 2018-12-19 LAB — BASIC METABOLIC PANEL
Anion gap: 8 (ref 5–15)
BUN: 22 mg/dL (ref 8–23)
CO2: 24 mmol/L (ref 22–32)
Calcium: 8.7 mg/dL — ABNORMAL LOW (ref 8.9–10.3)
Chloride: 110 mmol/L (ref 98–111)
Creatinine, Ser: 0.8 mg/dL (ref 0.44–1.00)
GFR calc Af Amer: >60 mL/min (ref >60)
GFR calc non Af Amer: >60 mL/min (ref >60)
Glucose, Bld: 84 mg/dL (ref 70–99)
Potassium: 4.5 mmol/L (ref 3.5–5.1)
Sodium: 142 mmol/L (ref 135–145)

## 2018-12-19 LAB — BRAIN NATRIURETIC PEPTIDE: B Natriuretic Peptide: 486.5 pg/mL — ABNORMAL HIGH (ref 0.0–100.0)

## 2018-12-19 LAB — SARS CORONAVIRUS 2 BY RT PCR (HOSPITAL ORDER, PERFORMED IN ~~LOC~~ HOSPITAL LAB): SARS Coronavirus 2: NEGATIVE

## 2018-12-19 MED ORDER — POLYETHYLENE GLYCOL 3350 17 G PO PACK: 17.0000 g | PACK | Freq: Every day | ORAL | Status: DC | PRN

## 2018-12-19 MED ORDER — SODIUM CHLORIDE (PF) 0.9 % IJ SOLN
INTRAMUSCULAR | Status: AC
  Filled 2018-12-19: qty 50

## 2018-12-19 MED ORDER — HEPARIN BOLUS VIA INFUSION
2000.0000 [IU] | Freq: Once | INTRAVENOUS | Status: AC
  Administered 2018-12-19: 2000 [IU] via INTRAVENOUS
  Filled 2018-12-19: qty 2000

## 2018-12-19 MED ORDER — NYSTATIN 100000 UNIT/GM EX POWD
Freq: Two times a day (BID) | CUTANEOUS | Status: DC
  Administered 2018-12-19 – 2018-12-20 (×3): via TOPICAL
  Administered 2018-12-21: 1 via TOPICAL
  Administered 2018-12-21 – 2018-12-27 (×12): via TOPICAL
  Filled 2018-12-19 (×3): qty 15

## 2018-12-19 MED ORDER — ONDANSETRON HCL 4 MG/2ML IJ SOLN: 4.0000 mg | Freq: Four times a day (QID) | INTRAMUSCULAR | Status: DC | PRN

## 2018-12-19 MED ORDER — ONDANSETRON HCL 4 MG PO TABS: 4.0000 mg | ORAL_TABLET | Freq: Four times a day (QID) | ORAL | Status: DC | PRN

## 2018-12-19 MED ORDER — AMIODARONE HCL 200 MG PO TABS
200.0000 mg | ORAL_TABLET | Freq: Every day | ORAL | Status: DC
  Administered 2018-12-19 – 2018-12-27 (×9): 200 mg via ORAL
  Filled 2018-12-19 (×10): qty 1

## 2018-12-19 MED ORDER — FERROUS SULFATE 325 (65 FE) MG PO TABS
325.0000 mg | ORAL_TABLET | Freq: Three times a day (TID) | ORAL | Status: DC
  Administered 2018-12-19 – 2018-12-22 (×8): 325 mg via ORAL
  Filled 2018-12-19 (×8): qty 1

## 2018-12-19 MED ORDER — CARVEDILOL 25 MG PO TABS
25.0000 mg | ORAL_TABLET | Freq: Two times a day (BID) | ORAL | Status: DC
  Administered 2018-12-19 – 2018-12-20 (×2): 25 mg via ORAL
  Filled 2018-12-19 (×2): qty 1

## 2018-12-19 MED ORDER — HYDROCODONE-ACETAMINOPHEN 5-325 MG PO TABS
1.0000 | ORAL_TABLET | Freq: Four times a day (QID) | ORAL | Status: DC | PRN
  Administered 2018-12-19: 1 via ORAL
  Filled 2018-12-19 (×2): qty 1

## 2018-12-19 MED ORDER — PREDNISONE 5 MG PO TABS
15.0000 mg | ORAL_TABLET | Freq: Every day | ORAL | Status: DC
  Administered 2018-12-20 – 2018-12-22 (×2): 15 mg via ORAL
  Filled 2018-12-19 (×2): qty 1

## 2018-12-19 MED ORDER — ACETAMINOPHEN 650 MG RE SUPP: 650.0000 mg | Freq: Four times a day (QID) | RECTAL | Status: DC | PRN

## 2018-12-19 MED ORDER — ALBUTEROL SULFATE (2.5 MG/3ML) 0.083% IN NEBU: 5.0000 mg | INHALATION_SOLUTION | Freq: Once | RESPIRATORY_TRACT | Status: DC

## 2018-12-19 MED ORDER — HEPARIN (PORCINE) 25000 UT/250ML-% IV SOLN
1000.0000 [IU]/h | INTRAVENOUS | Status: DC
  Administered 2018-12-19: 11:00:00 1000 [IU]/h via INTRAVENOUS
  Filled 2018-12-19: qty 250

## 2018-12-19 MED ORDER — DOCUSATE SODIUM 100 MG PO CAPS
100.0000 mg | ORAL_CAPSULE | Freq: Two times a day (BID) | ORAL | Status: DC
  Administered 2018-12-21 – 2018-12-27 (×8): 100 mg via ORAL
  Filled 2018-12-19 (×15): qty 1

## 2018-12-19 MED ORDER — ACETAMINOPHEN 325 MG PO TABS
650.0000 mg | ORAL_TABLET | Freq: Four times a day (QID) | ORAL | Status: DC | PRN
  Administered 2018-12-20: 650 mg via ORAL
  Filled 2018-12-19: qty 2

## 2018-12-19 MED ORDER — FUROSEMIDE 20 MG PO TABS
20.0000 mg | ORAL_TABLET | Freq: Every day | ORAL | Status: DC
  Filled 2018-12-19 (×2): qty 1

## 2018-12-19 MED ORDER — IOHEXOL 350 MG/ML SOLN
100.0000 mL | Freq: Once | INTRAVENOUS | Status: AC | PRN
  Administered 2018-12-19: 100 mL via INTRAVENOUS

## 2018-12-19 MED ORDER — LATANOPROST 0.005 % OP SOLN
1.0000 [drp] | Freq: Every day | OPHTHALMIC | Status: DC
  Administered 2018-12-19 – 2018-12-26 (×7): 1 [drp] via OPHTHALMIC
  Filled 2018-12-19 (×3): qty 2.5

## 2018-12-19 NOTE — ED Notes (Signed)
Bedside commode placed in room for patient.

## 2018-12-19 NOTE — H&P (Signed)
History and Physical    Kelsey [name] HKV:[id number] DOB: 1932/01/15 DOA: 12/19/2018  PCP: [name], MD  Patient coming from: Home  Chief Complaint: Worsening shortness of breath  HPI: Kelsey [name] is a 85 y.o. female with medical history significant of chronic atrial fibrillation, chronic systolic heart failure, pulmonary hypertension, hyperlipidemia, right leg hematoma, recent PE/pulmonary infarct.  Patient presents secondary to 1 day history of worsening shortness of breath.  She reports having some shortness of breath for the last week does worsen.  She also has some bleeding from her right leg wound for which she receives home health nursing wound care.  She reports no chest pain, nausea, vomiting, abdominal pain.  She has persistent leg swelling.  ED Course: Vitals: Afebrile, pulse of 1 teens to 120s, respirations of 17-20, SPO2 of low 90s on 2 L via nasal cannula Labs: Hemoglobin of 10.1, BNP of 46 Imaging: CT chest significant for small right lower lobe PE and pulmonary infarct Medications/Course: None  Review of Systems: Review of Systems  Constitutional: Negative for chills and fever.  Respiratory: Positive for shortness of breath. Negative for cough, hemoptysis, sputum production and wheezing.   Cardiovascular: Negative for chest pain.  Gastrointestinal: Negative for abdominal pain, constipation, diarrhea, nausea and vomiting.  Musculoskeletal:       Right leg pain with some intermittent bleeding.  All other systems reviewed and are negative.   Past Medical History:  Diagnosis Date   Arthritis    DJD   Cancer ([place])    basal cell CA   Chronic anticoagulation    on Xarelto   Chronic atrial fibrillation    s/p failed DCCV now on rate control   Diastolic dysfunction    by echo 07/2012   Glaucoma    Hyperlipidemia    Hypertension    Pulmonary HTN (HCC)    mild - RVSP 40-70mmHg   Vocal cord paralysis     Past Surgical  History:  Procedure Laterality Date   BACK SURGERY     C6-C7 discectomy   basal cell CA removal     BREAST SURGERY     breast implants   CARDIOVERSION N/A 09/06/2012   Procedure: CARDIOVERSION;  Surgeon: [name], MD;  Location: [facility];  Service: Cardiovascular;  Laterality: N/A;   EYE SURGERY  2011   bilateral cataract surgery   FACIAL COSMETIC SURGERY     JOINT REPLACEMENT  2003   bilateral hip replacements   MUSCLE BIOPSY N/A 08/13/2017   Procedure: LEFT QUADRICEPS MUSCLE BIOPSY;  Surgeon: [name], MD;  Location: [facility];  Service: General;  Laterality: N/A;     reports that she has quit smoking. She has never used smokeless tobacco. She reports current alcohol use. She reports that she does not use drugs.  Allergies  Allergen Reactions   Cardizem [Diltiazem Hcl] Swelling   Clonidine Derivatives Swelling   Doxazosin Swelling    Family History  Problem Relation Age of Onset   Alzheimer's disease Mother     Prior to Admission medications   Medication Sig Start Date End Date Taking? Authorizing Provider  amiodarone (PACERONE) 200 MG tablet Take 1 tablet (200 mg total) by mouth daily. 12/11/18  Yes Lavina Hamman, MD  carvedilol (COREG) 25 MG tablet Take 1 tablet (25 mg total) by mouth 2 (two) times daily with a meal. 12/10/18  Yes Lavina Hamman, MD  ferrous sulfate 325 (65 FE) MG tablet Take 1 tablet (325 mg total)  by mouth 3 (three) times daily with meals. 12/10/18 01/10/19 Yes Lavina Hamman, MD  furosemide (LASIX) 20 MG tablet Take 1 tablet (20 mg total) by mouth daily as needed for fluid or edema (weight gain>3Lbs in 1 day). 12/10/18  Yes Lavina Hamman, MD  HYDROcodone-acetaminophen (NORCO/VICODIN) 5-325 MG tablet Take 1 tablet by mouth every 6 (six) hours as needed for moderate pain.   Yes [provider]  hydroxypropyl methylcellulose / hypromellose (ISOPTO TEARS / GONIOVISC) 2.5 % ophthalmic solution Place 1 drop into both eyes 4  (four) times daily as needed for dry eyes.   Yes [provider]  latanoprost (XALATAN) 0.005 % ophthalmic solution Place 1 drop into both eyes at bedtime.   Yes [provider]  neomycin-bacitracin-polymyxin (NEOSPORIN) ointment Apply 1 application topically as needed for wound care.   Yes [provider]  predniSONE (DELTASONE) 10 MG tablet Take 2 tablets (20 mg total) by mouth daily with breakfast. Patient taking differently: Take 15 mg by mouth daily with breakfast.  12/10/18  Yes Lavina Hamman, MD  docusate sodium (COLACE) 100 MG capsule Take 1 capsule (100 mg total) by mouth 2 (two) times daily. Patient not taking: Reported on 12/19/2018 12/10/18   Lavina Hamman, MD    Physical Exam:  Physical Exam Vitals signs reviewed.  Constitutional:      General: She is not in acute distress.    Appearance: She is well-developed. She is not diaphoretic.  HENT:     Mouth/Throat:     Dentition: Has dentures.  Eyes:     Conjunctiva/sclera: Conjunctivae normal.     Pupils: Pupils are equal, round, and reactive to light.  Neck:     Musculoskeletal: Normal range of motion.  Cardiovascular:     Rate and Rhythm: Tachycardia present. Rhythm irregular.     Heart sounds: Normal heart sounds. No murmur.  Pulmonary:     Effort: Pulmonary effort is normal. No respiratory distress.     Breath sounds: Normal breath sounds. No decreased breath sounds, wheezing or rales.  Abdominal:     General: Bowel sounds are normal. There is no distension.     Palpations: Abdomen is soft.     Tenderness: There is no abdominal tenderness. There is no guarding or rebound.  Musculoskeletal: Normal range of motion.        General: No tenderness.     Right lower leg: Edema present.     Left lower leg: Edema present.  Lymphadenopathy:     Cervical: No cervical adenopathy.  Skin:    General: Skin is warm and dry.  Neurological:     Mental Status: She is alert and oriented to person, place,  and time.    Labs on Admission: I have personally reviewed following labs and imaging studies  CBC: Recent Labs  Lab 12/19/18 0341  WBC 9.4  NEUTROABS 6.2  HGB 10.1*  HCT 33.7*  MCV 112.3*  PLT 614    Basic Metabolic Panel: Recent Labs  Lab 12/19/18 0341  NA 142  K 4.5  CL 110  CO2 24  GLUCOSE 84  BUN 22  CREATININE 0.80  CALCIUM 8.7*    GFR: Estimated Creatinine Clearance: 56 mL/min (by C-G formula based on SCr of 0.8 mg/dL).  Liver Function Tests: No results for input(s): AST, ALT, ALKPHOS, BILITOT, PROT, ALBUMIN in the last 168 hours. No results for input(s): LIPASE, AMYLASE in the last 168 hours. No results for input(s): AMMONIA in the last  168 hours.  Coagulation Profile: No results for input(s): INR, PROTIME in the last 168 hours.  Cardiac Enzymes: No results for input(s): CKTOTAL, CKMB, CKMBINDEX, TROPONINI in the last 168 hours.  BNP (last 3 results) No results for input(s): PROBNP in the last 8760 hours.  HbA1C: No results for input(s): HGBA1C in the last 72 hours.  CBG: No results for input(s): GLUCAP in the last 168 hours.  Lipid Profile: No results for input(s): CHOL, HDL, LDLCALC, TRIG, CHOLHDL, LDLDIRECT in the last 72 hours.  Thyroid Function Tests: No results for input(s): TSH, T4TOTAL, FREET4, T3FREE, THYROIDAB in the last 72 hours.  Anemia Panel: No results for input(s): VITAMINB12, FOLATE, FERRITIN, TIBC, IRON, RETICCTPCT in the last 72 hours.  Urine analysis:    Component Value Date/Time   COLORURINE AMBER (A) 12/01/2018 0331   APPEARANCEUR HAZY (A) 12/01/2018 0331   LABSPEC 1.024 12/01/2018 0331   PHURINE 5.0 12/01/2018 0331   GLUCOSEU NEGATIVE 12/01/2018 0331   HGBUR NEGATIVE 12/01/2018 0331   BILIRUBINUR NEGATIVE 12/01/2018 0331   KETONESUR 5 (A) 12/01/2018 0331   PROTEINUR 30 (A) 12/01/2018 0331   NITRITE NEGATIVE 12/01/2018 0331   LEUKOCYTESUR NEGATIVE 12/01/2018 0331     Radiological Exams on Admission: Dg  Chest 2 View  Result Date: 12/19/2018 CLINICAL DATA:  Shortness of breath. Hypoxia. EXAM: CHEST - 2 VIEW COMPARISON:  12/01/2018 FINDINGS: Stable mild cardiomegaly. Aortic atherosclerosis. No evidence of acute infiltrate or pleural effusion. Pulmonary hyperinflation again seen, suspicious for COPD. Bilateral breast implants noted with capsular calcification. IMPRESSION: Stable mild cardiomegaly and probable COPD. No active lung disease. Electronically Signed   By: Earle Gell M.D.   On: 12/19/2018 01:52   Ct Angio Chest Pe W And/or Wo Contrast  Result Date: 12/19/2018 CLINICAL DATA:  Hypoxia EXAM: CT ANGIOGRAPHY CHEST WITH CONTRAST TECHNIQUE: Multidetector CT imaging of the chest was performed using the standard protocol during bolus administration of intravenous contrast. Multiplanar CT image reconstructions and MIPs were obtained to evaluate the vascular anatomy. CONTRAST:  132mL OMNIPAQUE IOHEXOL 350 MG/ML SOLN COMPARISON:  Chest radiographs dated 12/19/2018 FINDINGS: Cardiovascular: Satisfactory opacification of the bilateral pulmonary arteries to the segmental level. Segmental/subsegmental right lower lobe pulmonary emboli (series 5/images 137, 147, and 160). Overall clot burden is mild. No evidence of right heart strain. No evidence of thoracic aortic aneurysm. Atherosclerotic calcifications of the aortic arch. Cardiomegaly.  No pericardial effusion. Mild coronary atherosclerosis of the LAD and right coronary artery. Mediastinum/Nodes: Calcified subcarinal nodes which do not meet pathologic CT size criteria. Visualized thyroid is unremarkable. Lungs/Pleura: Evaluation of the lung parenchyma is constrained by respiratory motion. Within that constraint, there are no suspicious pulmonary nodules. Biapical pleural-parenchymal scarring. Moderate centrilobular and paraseptal emphysematous changes, upper lung predominant. Scattered calcified granulomata throughout the lungs bilaterally. Stable patchy subpleural  right basilar opacity, unchanged from recent CT abdomen/pelvis, likely reflecting a pulmonary infarct in this context. Trace bilateral pleural effusions. No pneumothorax. Upper Abdomen: Visualized upper abdomen is unchanged from recent CT. Musculoskeletal: Bilateral breast augmentation. Stable mild superior endplate compression fracture deformity at T11. Multilevel degenerative changes of the visualized thoracolumbar spine. Review of the MIP images confirms the above findings. IMPRESSION: Segmental/subsegmental right lower lobe pulmonary emboli, as above. Overall clot burden is mild. No evidence of right heart strain. Critical Value/emergent results were called by telephone at the time of interpretation on 12/19/2018 at 7:20 am to Dr. Ripley Fraise , who verbally acknowledged these results. Suspected pulmonary infarct in the posterior right lower lobe, unchanged  from recent CT abdomen/pelvis. Trace bilateral pleural effusions. Aortic Atherosclerosis (ICD10-I70.0) and Emphysema (ICD10-J43.9). Electronically Signed   By: Julian Hy M.D.   On: 12/19/2018 07:20    EKG: Independently reviewed. Atrial fibrillation with mild tachycardia, PVCs, anterolateral t-wave inversions that are slightly more pronounced from previous  Assessment/Plan Active Problems:   Acute pulmonary embolism (HCC)  Acute pulmonary embolism Right lower lobe infarct Mildly symptomatic with associated hypoxia. Recently taken off of anticoagulation secondary blood loss anemia and hematoma. Hemoglobin stable from previous discharge. No evidence of heart strain on CT and patient with a recent Transthoracic Echocardiogram two weeks ago. Upon chart review, this was first noticed 9 days ago incidentally on CT abdomen/pelvis. Patient with worsening symptoms. At that time, no anticoagulation secondary to hematoma. -Heparin drip -Repeat LE duplex scan for source -If fails anticoagulation, may need to consider IVC filter -Daily  CBC  Chronic systolic heart failure EF of 25-30% from Transthoracic Echocardiogram on 5/14. Significant LE edema with no evidence of acute exacerbation. BNP of 486 today; 1500 two weeks prior. -Lasix daily -Daily weights and strict in/out  Paroxysmal atrial fibrillation Slight tachycardia in setting of acute PE -Resume home amiodarone, Coreg  Leg wounds/weeping Right calf hematoma Patient is followed by home health and receives wound care. -WOC consult  Macrocytic anemia Normal folate/vitamin B12. Stable from discharge -CBC as mentioned above  Chronic back pain Patient is on steroids chronically in addition to Norco as needed -Continue Prednisone and Norco prn   DVT prophylaxis: Heparin drip Code Status: DNR Family Communication: None Disposition Plan: Telemetry Consults called: None Admission status: Observation   Cordelia Poche, MD [facility] 12/19/2018, 10:11 AM  If 7PM-7AM, please contact night-coverage [email or web address] Password [email or web address]

## 2018-12-19 NOTE — Progress Notes (Signed)
ANTICOAGULATION CONSULT NOTE - Initial Consult  Pharmacy Consult for IV heparin Indication: pulmonary embolus  Allergies  Allergen Reactions  . Cardizem [Diltiazem Hcl] Swelling  . Clonidine Derivatives Swelling  . Doxazosin Swelling    Patient Measurements: Height: 5\' 6"  (167.6 cm) Weight: 177 lb 11.1 oz (80.6 kg) IBW/kg (Calculated) : 59.3 Heparin Dosing Weight: 76  Vital Signs: Temp: 98.2 F (36.8 C) (05/31 0525) Temp Source: Oral (05/31 0525) BP: 139/96 (05/31 0930) Pulse Rate: 130 (05/31 0930)  Labs: Recent Labs    12/19/18 0341  HGB 10.1*  HCT 33.7*  PLT 298  CREATININE 0.80    Estimated Creatinine Clearance: 56 mL/min (by C-G formula based on SCr of 0.8 mg/dL).   Medical History: Past Medical History:  Diagnosis Date  . Arthritis    DJD  . Cancer ([place])    basal cell CA  . Chronic anticoagulation    on Xarelto  . Chronic atrial fibrillation    s/p failed DCCV now on rate control  . Diastolic dysfunction    by echo 07/2012  . Glaucoma   . Hyperlipidemia   . Hypertension   . Pulmonary HTN (HCC)    mild - RVSP 40-66mmHg  . Vocal cord paralysis     Medications:  Scheduled:  . sodium chloride (PF)       Infusions:    Assessment: 85 yo female with new PE to start IV heparin per Rx dosing. Baseline CBC and SCr done. No anticoagulant prescribed PTA.   Goal of Therapy:  Heparin level 0.3-0.7 units/ml Monitor platelets by anticoagulation protocol: Yes   Plan:  1) IV heparin 2000 unit bolus then 2) IV heparin 1000 units/hr 3) check heparin level 8 hours after start of IV heparin 4) Daily CBC and heparin level   Adrian Saran, PharmD, BCPS 12/19/2018 9:50 AM

## 2018-12-19 NOTE — ED Notes (Signed)
Pt placed on 2 L of Nasal Cannula do to oxygen level not going over 85% while on room air.

## 2018-12-19 NOTE — ED Notes (Signed)
Hospitalist at bedside 

## 2018-12-19 NOTE — ED Notes (Signed)
Bed: UE59 Expected date:  Expected time:  Means of arrival:  Comments: EMS 85 yo from home/care giver states O2 sat 80's-O2 applied by EMS-hx CHF-O2 4l/[place] with sat 90's now

## 2018-12-19 NOTE — ED Provider Notes (Signed)
[facility] DEPT Provider Note   CSN: [id number] Arrival date & time: 12/19/18  0102    History   Chief Complaint Chief Complaint  Patient presents with   Shortness of Breath    HPI KORRIN WATERFIELD is a 85 y.o. female.     The history is provided by the patient.  Shortness of Breath  Severity:  Moderate Timing:  Constant Progression:  Unchanged Chronicity:  New Relieved by:  Nothing Worsened by:  Nothing Associated symptoms: no chest pain, no fever and no vomiting   Patient with history of atrial fibrillation, CHF, hypertension Presents for hypoxia evaluation. Patient had a recent prolonged hospital stay.  She was discharged approximately 8 days ago. She has been evaluated at home by a home health nurse for lower extremity edema, and was noted to be hypoxic on room air.  She does not typically use oxygen. No fever/vomiting.  No chest pain.  She reports feeling improved since her hospital stay  Past Medical History:  Diagnosis Date   Arthritis    DJD   Cancer ([place])    basal cell CA   Chronic anticoagulation    on Xarelto   Chronic atrial fibrillation    s/p failed DCCV now on rate control   Diastolic dysfunction    by echo 07/2012   Glaucoma    Hyperlipidemia    Hypertension    Pulmonary HTN (HCC)    mild - RVSP 40-53mmHg   Vocal cord paralysis     Patient Active Problem List   Diagnosis Date Noted   Acute systolic heart failure (HCC)    Atrial fibrillation with RVR (HCC)    Elevated troponin    Acute on chronic combined systolic and diastolic CHF (congestive heart failure) (HCC)    Severe sepsis ([place]) 12/01/2018   Cardiogenic shock ([place]) 12/01/2018   Elevated lactic acid level    Hypotension due to hypovolemia    Chronic cough 03/07/2014   Chronic atrial fibrillation with RVR 03/07/2014   Chronic anticoagulation 93/71/6967   Diastolic dysfunction    Vocal cord polyp 05/08/2013   PSVT  (paroxysmal supraventricular tachycardia) ([place]) 05/08/2013   Allergic rhinitis 05/08/2013   Hypertension    Dyslipidemia    Cancer (HCC)    Arthritis    Pulmonary HTN ([place])     Past Surgical History:  Procedure Laterality Date   BACK SURGERY     C6-C7 discectomy   basal cell CA removal     BREAST SURGERY     breast implants   CARDIOVERSION N/A 09/06/2012   Procedure: CARDIOVERSION;  Surgeon: [name], MD;  Location: [facility];  Service: Cardiovascular;  Laterality: N/A;   EYE SURGERY  2011   bilateral cataract surgery   FACIAL COSMETIC SURGERY     JOINT REPLACEMENT  2003   bilateral hip replacements   MUSCLE BIOPSY N/A 08/13/2017   Procedure: LEFT QUADRICEPS MUSCLE BIOPSY;  Surgeon: [name], MD;  Location: [facility] OR;  Service: General;  Laterality: N/A;     OB History   No obstetric history on file.      Home Medications    Prior to Admission medications   Medication Sig Start Date End Date Taking? Authorizing Provider  amiodarone (PACERONE) 200 MG tablet Take 1 tablet (200 mg total) by mouth daily. 12/11/18  Yes Lavina Hamman, MD  carvedilol (COREG) 25 MG tablet Take 1 tablet (25 mg total) by mouth 2 (two) times daily with a meal. 12/10/18  Yes Lavina Hamman, MD  ferrous sulfate 325 (65 FE) MG tablet Take 1 tablet (325 mg total) by mouth 3 (three) times daily with meals. 12/10/18 01/10/19 Yes Lavina Hamman, MD  furosemide (LASIX) 20 MG tablet Take 1 tablet (20 mg total) by mouth daily as needed for fluid or edema (weight gain>3Lbs in 1 day). 12/10/18  Yes Lavina Hamman, MD  HYDROcodone-acetaminophen (NORCO/VICODIN) 5-325 MG tablet Take 1 tablet by mouth every 6 (six) hours as needed for moderate pain.   Yes [provider]  hydroxypropyl methylcellulose / hypromellose (ISOPTO TEARS / GONIOVISC) 2.5 % ophthalmic solution Place 1 drop into both eyes 4 (four) times daily as needed for dry eyes.   Yes [provider]  latanoprost  (XALATAN) 0.005 % ophthalmic solution Place 1 drop into both eyes at bedtime.   Yes [provider]  neomycin-bacitracin-polymyxin (NEOSPORIN) ointment Apply 1 application topically as needed for wound care.   Yes [provider]  predniSONE (DELTASONE) 10 MG tablet Take 2 tablets (20 mg total) by mouth daily with breakfast. Patient taking differently: Take 15 mg by mouth daily with breakfast.  12/10/18  Yes Lavina Hamman, MD  docusate sodium (COLACE) 100 MG capsule Take 1 capsule (100 mg total) by mouth 2 (two) times daily. Patient not taking: Reported on 12/19/2018 12/10/18   Lavina Hamman, MD    Family History Family History  Problem Relation Age of Onset   Alzheimer's disease Mother     Social History Social History   Tobacco Use   Smoking status: Former Smoker   Smokeless tobacco: Never Used  Substance Use Topics   Alcohol use: Yes    Comment: occasional wine   Drug use: No     Allergies   Cardizem [diltiazem hcl]; Clonidine derivatives; and Doxazosin   Review of Systems Review of Systems  Constitutional: Negative for fever.  Respiratory: Positive for shortness of breath.   Cardiovascular: Positive for leg swelling. Negative for chest pain.  Gastrointestinal: Negative for vomiting.  All other systems reviewed and are negative.    Physical Exam Updated Vital Signs BP (!) 134/55 (BP Location: Left Arm)    Pulse (!) 114    Temp 98.5 F (36.9 C) (Oral)    Resp 20    Ht 1.676 m (5\' 6" )    Wt 80.6 kg    SpO2 96%    BMI 28.68 kg/m   Physical Exam  CONSTITUTIONAL: Elderly, chronically ill-appearing, no acute distress HEAD: Normocephalic/atraumatic EYES: EOMI NECK: supple no meningeal signs SPINE/BACK:entire spine nontender CV: Irregular, no loud murmurs LUNGS: Coarse breath sounds bilaterally, pulse ox dropped to about 90% on room air ABDOMEN: soft, nontender GU:no cva tenderness NEURO: Pt is awake/alert/appropriate, moves all  extremitiesx4.  No facial droop.   EXTREMITIES: pulses normal/equal, full ROM, bandages noted to lower extremities.  Peripheral edema noted SKIN: warm, color normal PSYCH: no abnormalities of mood noted, alert and oriented to situation  ED Treatments / Results  Labs (all labs ordered are listed, but only abnormal results are displayed) Labs Reviewed  BASIC METABOLIC PANEL - Abnormal; Notable for the following components:      Result Value   Calcium 8.7 (*)    All other components within normal limits  CBC WITH DIFFERENTIAL/PLATELET - Abnormal; Notable for the following components:   RBC 3.00 (*)    Hemoglobin 10.1 (*)    HCT 33.7 (*)    MCV 112.3 (*)    RDW 17.1 (*)  nRBC 1.1 (*)    Abs Immature Granulocytes 0.37 (*)    All other components within normal limits  BRAIN NATRIURETIC PEPTIDE - Abnormal; Notable for the following components:   B Natriuretic Peptide 486.5 (*)    All other components within normal limits  SARS CORONAVIRUS 2 (HOSPITAL ORDER, [facility])    EKG EKG Interpretation  Date/Time:  Sunday Dec 19 2018 02:21:06 EDT Ventricular Rate:  105 PR Interval:    QRS Duration: 84 QT Interval:  346 QTC Calculation: 458 R Axis:   27 Text Interpretation:  Atrial fibrillation Multiple ventricular premature complexes RSR' in V1 or V2, probably normal variant Repol abnrm suggests ischemia, anterolateral Confirmed by Ripley Fraise [phone]) on 12/19/2018 2:24:03 AM   Radiology Dg Chest 2 View  Result Date: 12/19/2018 CLINICAL DATA:  Shortness of breath. Hypoxia. EXAM: CHEST - 2 VIEW COMPARISON:  12/01/2018 FINDINGS: Stable mild cardiomegaly. Aortic atherosclerosis. No evidence of acute infiltrate or pleural effusion. Pulmonary hyperinflation again seen, suspicious for COPD. Bilateral breast implants noted with capsular calcification. IMPRESSION: Stable mild cardiomegaly and probable COPD. No active lung disease. Electronically Signed   By: Earle Gell M.D.   On: 12/19/2018 01:52   Ct Angio Chest Pe W And/or Wo Contrast  Result Date: 12/19/2018 CLINICAL DATA:  Hypoxia EXAM: CT ANGIOGRAPHY CHEST WITH CONTRAST TECHNIQUE: Multidetector CT imaging of the chest was performed using the standard protocol during bolus administration of intravenous contrast. Multiplanar CT image reconstructions and MIPs were obtained to evaluate the vascular anatomy. CONTRAST:  128mL OMNIPAQUE IOHEXOL 350 MG/ML SOLN COMPARISON:  Chest radiographs dated 12/19/2018 FINDINGS: Cardiovascular: Satisfactory opacification of the bilateral pulmonary arteries to the segmental level. Segmental/subsegmental right lower lobe pulmonary emboli (series 5/images 137, 147, and 160). Overall clot burden is mild. No evidence of right heart strain. No evidence of thoracic aortic aneurysm. Atherosclerotic calcifications of the aortic arch. Cardiomegaly.  No pericardial effusion. Mild coronary atherosclerosis of the LAD and right coronary artery. Mediastinum/Nodes: Calcified subcarinal nodes which do not meet pathologic CT size criteria. Visualized thyroid is unremarkable. Lungs/Pleura: Evaluation of the lung parenchyma is constrained by respiratory motion. Within that constraint, there are no suspicious pulmonary nodules. Biapical pleural-parenchymal scarring. Moderate centrilobular and paraseptal emphysematous changes, upper lung predominant. Scattered calcified granulomata throughout the lungs bilaterally. Stable patchy subpleural right basilar opacity, unchanged from recent CT abdomen/pelvis, likely reflecting a pulmonary infarct in this context. Trace bilateral pleural effusions. No pneumothorax. Upper Abdomen: Visualized upper abdomen is unchanged from recent CT. Musculoskeletal: Bilateral breast augmentation. Stable mild superior endplate compression fracture deformity at T11. Multilevel degenerative changes of the visualized thoracolumbar spine. Review of the MIP images confirms the above  findings. IMPRESSION: Segmental/subsegmental right lower lobe pulmonary emboli, as above. Overall clot burden is mild. No evidence of right heart strain. Critical Value/emergent results were called by telephone at the time of interpretation on 12/19/2018 at 7:20 am to Dr. Ripley Fraise , who verbally acknowledged these results. Suspected pulmonary infarct in the posterior right lower lobe, unchanged from recent CT abdomen/pelvis. Trace bilateral pleural effusions. Aortic Atherosclerosis (ICD10-I70.0) and Emphysema (ICD10-J43.9). Electronically Signed   By: Julian Hy M.D.   On: 12/19/2018 07:20    Procedures Procedures    CRITICAL CARE Performed by: Sharyon Cable Total critical care time: 30 minutes Critical care time was exclusive of separately billable procedures and treating other patients. Critical care was necessary to treat or prevent imminent or life-threatening deterioration. Critical care was time spent personally  by me on the following activities: development of treatment plan with patient and/or surrogate as well as nursing, discussions with consultants, evaluation of patient's response to treatment, examination of patient, obtaining history from patient or surrogate, ordering and performing treatments and interventions, ordering and review of laboratory studies, ordering and review of radiographic studies, pulse oximetry and re-evaluation of patient's condition. PATIENT WITH PULMONARY EMBOLISM WITH HYPOXIA REQUIRING ADMISSION  Medications Ordered in ED Medications  sodium chloride (PF) 0.9 % injection (has no administration in time range)  iohexol (OMNIPAQUE) 350 MG/ML injection 100 mL (100 mLs Intravenous Contrast Given 12/19/18 3845)     Initial Impression / Assessment and Plan / ED Course  I have reviewed the triage vital signs and the nursing notes.  Pertinent labs & imaging results that were available during my care of the patient were reviewed by me and  considered in my medical decision making (see chart for details).        3:29 AM PT With recent hospital stay discharge on 12/10/2018 She was found to have sepsis of unclear etiology and cardiogenic shock COVID negative at that time Not currently on anticoagulation due to recent bleeding hematoma of calf She was found to have incidental pulmonary infarct/PE At the time of hospital stay she was contraindicated for anticoagulation.  At that time in the hospital she was asymptomatic without hypoxia so no further treatment was pursued.  No DVT was found. I am concerned at this point that the hypoxia is due to a potentially worsening PE. Labs are pending this time, may need CT imaging of her chest 6:33 AM HGB stable Awaiting CT imaging If negative, she can be discharged home on oxygen 7:32 AM Ct CONFIRMS PE SHE WILL NEED TO BE ADMITTED, WILL NEED TO CONSIDER TO RE-START ANTICOAGULATION  Final Clinical Impressions(s) / ED Diagnoses   Final diagnoses:  Other acute pulmonary embolism, unspecified whether acute cor pulmonale present [facility])    ED Discharge Orders    None       Ripley Fraise, MD 12/19/18 [phone]

## 2018-12-19 NOTE — ED Triage Notes (Addendum)
Patient brought in by [facility]. Patient called home health nurse to change dressings on legs. Patient nurse could not get patient O2 sat - 85% at home. Patient doctors told patient to go to hospital and be evaluated. EMS put patient on [place] 4L. Patient is O2 sat-96%

## 2018-12-19 NOTE — Progress Notes (Signed)
Pharmacy: Re- heparin  Patient's an 85 y.o F with recent hx PE/pulmonary infarct (per abd CT on 12/10/18) and right leg hematoma, presented to th e ED on 5/31 with c/o SOB. Of note, discharge note on 12/10/18 indicated that anticoag. was held d/t severe calf hematoma. Chest CT angio on 5/31 showed "segmental/subsegmental right lower lobe pulmonary emboli."  Heparin drip started on admission for PE.  - First heparin level now back therapeutic at 0.46 (goal 0.3-0.7) - hgb down slightly to 9.1  Plan: - continue heparin drip at 1000 units/hr - check another 8 hr level to confirm level is still therapeutic before changing to daily monitoring - monitor for s/s bleeding  Kelsey [name], PharmD, BCPS 12/19/2018 9:00 PM

## 2018-12-20 ENCOUNTER — Observation Stay (HOSPITAL_COMMUNITY): Payer: Medicare HMO

## 2018-12-20 DIAGNOSIS — I2782 Chronic pulmonary embolism: Secondary | ICD-10-CM | POA: Diagnosis not present

## 2018-12-20 DIAGNOSIS — Z820 Family history of epilepsy and other diseases of the nervous system: Secondary | ICD-10-CM | POA: Diagnosis not present

## 2018-12-20 DIAGNOSIS — Z7401 Bed confinement status: Secondary | ICD-10-CM | POA: Diagnosis not present

## 2018-12-20 DIAGNOSIS — I743 Embolism and thrombosis of arteries of the lower extremities: Secondary | ICD-10-CM | POA: Diagnosis present

## 2018-12-20 DIAGNOSIS — R609 Edema, unspecified: Secondary | ICD-10-CM | POA: Diagnosis not present

## 2018-12-20 DIAGNOSIS — R531 Weakness: Secondary | ICD-10-CM | POA: Diagnosis not present

## 2018-12-20 DIAGNOSIS — Z1159 Encounter for screening for other viral diseases: Secondary | ICD-10-CM | POA: Diagnosis not present

## 2018-12-20 DIAGNOSIS — D62 Acute posthemorrhagic anemia: Secondary | ICD-10-CM | POA: Diagnosis present

## 2018-12-20 DIAGNOSIS — R6521 Severe sepsis with septic shock: Secondary | ICD-10-CM | POA: Diagnosis not present

## 2018-12-20 DIAGNOSIS — Z888 Allergy status to other drugs, medicaments and biological substances status: Secondary | ICD-10-CM | POA: Diagnosis not present

## 2018-12-20 DIAGNOSIS — Z7901 Long term (current) use of anticoagulants: Secondary | ICD-10-CM | POA: Diagnosis not present

## 2018-12-20 DIAGNOSIS — Z515 Encounter for palliative care: Secondary | ICD-10-CM | POA: Diagnosis not present

## 2018-12-20 DIAGNOSIS — I5043 Acute on chronic combined systolic (congestive) and diastolic (congestive) heart failure: Secondary | ICD-10-CM | POA: Diagnosis present

## 2018-12-20 DIAGNOSIS — I5022 Chronic systolic (congestive) heart failure: Secondary | ICD-10-CM | POA: Diagnosis not present

## 2018-12-20 DIAGNOSIS — J9601 Acute respiratory failure with hypoxia: Secondary | ICD-10-CM | POA: Diagnosis present

## 2018-12-20 DIAGNOSIS — I2699 Other pulmonary embolism without acute cor pulmonale: Secondary | ICD-10-CM

## 2018-12-20 DIAGNOSIS — Z87891 Personal history of nicotine dependence: Secondary | ICD-10-CM | POA: Diagnosis not present

## 2018-12-20 DIAGNOSIS — A419 Sepsis, unspecified organism: Secondary | ICD-10-CM | POA: Diagnosis not present

## 2018-12-20 DIAGNOSIS — G8929 Other chronic pain: Secondary | ICD-10-CM | POA: Diagnosis present

## 2018-12-20 DIAGNOSIS — I2510 Atherosclerotic heart disease of native coronary artery without angina pectoris: Secondary | ICD-10-CM | POA: Diagnosis present

## 2018-12-20 DIAGNOSIS — I10 Essential (primary) hypertension: Secondary | ICD-10-CM | POA: Diagnosis not present

## 2018-12-20 DIAGNOSIS — J984 Other disorders of lung: Secondary | ICD-10-CM | POA: Diagnosis not present

## 2018-12-20 DIAGNOSIS — S8010XA Contusion of unspecified lower leg, initial encounter: Secondary | ICD-10-CM | POA: Diagnosis not present

## 2018-12-20 DIAGNOSIS — I70201 Unspecified atherosclerosis of native arteries of extremities, right leg: Secondary | ICD-10-CM | POA: Diagnosis present

## 2018-12-20 DIAGNOSIS — I2609 Other pulmonary embolism with acute cor pulmonale: Secondary | ICD-10-CM | POA: Diagnosis not present

## 2018-12-20 DIAGNOSIS — Z79899 Other long term (current) drug therapy: Secondary | ICD-10-CM | POA: Diagnosis not present

## 2018-12-20 DIAGNOSIS — R0902 Hypoxemia: Secondary | ICD-10-CM | POA: Diagnosis not present

## 2018-12-20 DIAGNOSIS — M2550 Pain in unspecified joint: Secondary | ICD-10-CM | POA: Diagnosis not present

## 2018-12-20 DIAGNOSIS — Z85828 Personal history of other malignant neoplasm of skin: Secondary | ICD-10-CM | POA: Diagnosis not present

## 2018-12-20 DIAGNOSIS — E785 Hyperlipidemia, unspecified: Secondary | ICD-10-CM | POA: Diagnosis present

## 2018-12-20 DIAGNOSIS — N9489 Other specified conditions associated with female genital organs and menstrual cycle: Secondary | ICD-10-CM | POA: Diagnosis not present

## 2018-12-20 DIAGNOSIS — Z66 Do not resuscitate: Secondary | ICD-10-CM | POA: Diagnosis present

## 2018-12-20 DIAGNOSIS — I110 Hypertensive heart disease with heart failure: Secondary | ICD-10-CM | POA: Diagnosis present

## 2018-12-20 DIAGNOSIS — M549 Dorsalgia, unspecified: Secondary | ICD-10-CM | POA: Diagnosis present

## 2018-12-20 DIAGNOSIS — M7981 Nontraumatic hematoma of soft tissue: Secondary | ICD-10-CM | POA: Diagnosis not present

## 2018-12-20 DIAGNOSIS — I87319 Chronic venous hypertension (idiopathic) with ulcer of unspecified lower extremity: Secondary | ICD-10-CM | POA: Diagnosis not present

## 2018-12-20 DIAGNOSIS — Z7189 Other specified counseling: Secondary | ICD-10-CM | POA: Diagnosis not present

## 2018-12-20 DIAGNOSIS — Z96643 Presence of artificial hip joint, bilateral: Secondary | ICD-10-CM | POA: Diagnosis present

## 2018-12-20 DIAGNOSIS — I482 Chronic atrial fibrillation, unspecified: Secondary | ICD-10-CM | POA: Diagnosis not present

## 2018-12-20 DIAGNOSIS — I4819 Other persistent atrial fibrillation: Secondary | ICD-10-CM | POA: Diagnosis not present

## 2018-12-20 DIAGNOSIS — I272 Pulmonary hypertension, unspecified: Secondary | ICD-10-CM | POA: Diagnosis not present

## 2018-12-20 DIAGNOSIS — Z7952 Long term (current) use of systemic steroids: Secondary | ICD-10-CM | POA: Diagnosis not present

## 2018-12-20 DIAGNOSIS — I2694 Multiple subsegmental pulmonary emboli without acute cor pulmonale: Secondary | ICD-10-CM | POA: Diagnosis not present

## 2018-12-20 LAB — CBC
HCT: 31.4 % — ABNORMAL LOW (ref 36.0–46.0)
Hemoglobin: 9.9 g/dL — ABNORMAL LOW (ref 12.0–15.0)
MCH: 34.5 pg — ABNORMAL HIGH (ref 26.0–34.0)
MCHC: 31.5 g/dL (ref 30.0–36.0)
MCV: 109.4 fL — ABNORMAL HIGH (ref 80.0–100.0)
Platelets: 263 10*3/uL (ref 150–400)
RBC: 2.87 MIL/uL — ABNORMAL LOW (ref 3.87–5.11)
RDW: 17.3 % — ABNORMAL HIGH (ref 11.5–15.5)
WBC: 11.5 10*3/uL — ABNORMAL HIGH (ref 4.0–10.5)
nRBC: 0.3 % — ABNORMAL HIGH (ref 0.0–0.2)

## 2018-12-20 LAB — BASIC METABOLIC PANEL
Anion gap: 8 (ref 5–15)
BUN: 17 mg/dL (ref 8–23)
CO2: 24 mmol/L (ref 22–32)
Calcium: 8.2 mg/dL — ABNORMAL LOW (ref 8.9–10.3)
Chloride: 107 mmol/L (ref 98–111)
Creatinine, Ser: 0.68 mg/dL (ref 0.44–1.00)
GFR calc Af Amer: >60 mL/min (ref >60)
GFR calc non Af Amer: >60 mL/min (ref >60)
Glucose, Bld: 90 mg/dL (ref 70–99)
Potassium: 4.4 mmol/L (ref 3.5–5.1)
Sodium: 139 mmol/L (ref 135–145)

## 2018-12-20 LAB — URINALYSIS, ROUTINE W REFLEX MICROSCOPIC
Bilirubin Urine: NEGATIVE
Glucose, UA: NEGATIVE mg/dL
Hgb urine dipstick: NEGATIVE
Ketones, ur: NEGATIVE mg/dL
Leukocytes,Ua: NEGATIVE
Nitrite: NEGATIVE
Protein, ur: NEGATIVE mg/dL
Specific Gravity, Urine: 1.014 (ref 1.005–1.030)
pH: 6 (ref 5.0–8.0)

## 2018-12-20 LAB — OCCULT BLOOD X 1 CARD TO LAB, STOOL: Fecal Occult Bld: NEGATIVE

## 2018-12-20 LAB — HEMOGLOBIN AND HEMATOCRIT, BLOOD
HCT: 30.5 % — ABNORMAL LOW (ref 36.0–46.0)
Hemoglobin: 9.6 g/dL — ABNORMAL LOW (ref 12.0–15.0)

## 2018-12-20 LAB — HEPARIN LEVEL (UNFRACTIONATED)
Heparin Unfractionated: 0.28 IU/mL — ABNORMAL LOW (ref 0.30–0.70)
Heparin Unfractionated: 0.58 IU/mL (ref 0.30–0.70)

## 2018-12-20 MED ORDER — FUROSEMIDE 20 MG PO TABS
20.0000 mg | ORAL_TABLET | Freq: Every day | ORAL | Status: DC
  Administered 2018-12-20 – 2018-12-22 (×3): 20 mg via ORAL
  Filled 2018-12-20 (×3): qty 1

## 2018-12-20 MED ORDER — SODIUM CHLORIDE 0.9 % IV BOLUS
500.0000 mL | Freq: Once | INTRAVENOUS | Status: AC
  Administered 2018-12-20: 500 mL via INTRAVENOUS

## 2018-12-20 MED ORDER — CARVEDILOL 12.5 MG PO TABS
12.5000 mg | ORAL_TABLET | Freq: Two times a day (BID) | ORAL | Status: DC
  Administered 2018-12-21 – 2018-12-22 (×2): 12.5 mg via ORAL
  Filled 2018-12-20 (×3): qty 1

## 2018-12-20 MED ORDER — HEPARIN (PORCINE) 25000 UT/250ML-% IV SOLN
1150.0000 [IU]/h | INTRAVENOUS | Status: DC
  Administered 2018-12-20 (×2): 1150 [IU]/h via INTRAVENOUS
  Filled 2018-12-20 (×2): qty 250

## 2018-12-20 NOTE — Progress Notes (Signed)
[place] for IV heparin Indication: pulmonary embolus  Allergies  Allergen Reactions  . Cardizem [Diltiazem Hcl] Swelling  . Clonidine Derivatives Swelling  . Doxazosin Swelling    Patient Measurements: Height: 5\' 6"  (167.6 cm) Weight: 178 lb 2.1 oz (80.8 kg) IBW/kg (Calculated) : 59.3 Heparin Dosing Weight: 76  Vital Signs: Temp: 98.6 F (37 C) (06/01 1406) Temp Source: Oral (06/01 1406) BP: 110/62 (06/01 1406) Pulse Rate: 98 (06/01 1406)  Labs: Recent Labs    12/19/18 0341 12/19/18 1945 12/20/18 0400 12/20/18 1401  HGB 10.1* 9.1* 9.9* 9.6*  HCT 33.7* 29.6* 31.4* 30.5*  PLT 298  --  263  --   HEPARINUNFRC  --  0.46 0.28* 0.58  CREATININE 0.80  --  0.68  --     Estimated Creatinine Clearance: 56.1 mL/min (by C-G formula based on SCr of 0.68 mg/dL).  Assessment: 85 yo female with new PE to start IV heparin per Rx dosing. Baseline CBC and SCr done. No anticoagulant prescribed PTA. Discharge note on 12/10/18 indicated that anticoag. was held d/t severe calf hematoma  12/20/2018  Heparin level therapeutic at 0.58 after drip increased to 1150 units/hr H/H 9.6/30.5 > stable from AM labs 9.6/31.4 Pt had black tarry stool this morning. FOB negative 6/1 doppler neg for DVT but exam was limited by pt pain R leg would w/ intermittent bleeding noted 5/31  Goal of Therapy:  Heparin level 0.3-0.7 units/ml Monitor platelets by anticoagulation protocol: Yes   Plan:  Continue heparin drip at 1150 units/hr Daily CBC and heparin level F/u long term plans> IVC filter vs anticoagulation  Eudelia Bunch, Pharm.D 12/20/2018 2:49 PM

## 2018-12-20 NOTE — Progress Notes (Signed)
Referring Physician: Dr Jonnie Finner  Patient name: Kelsey [name] MRN: [id number] DOB: [birth year] Sex: female  REASON FOR CONSULT: right calf hematoma  HPI: Kelsey [name] is a 85 y.o. female, who developed a spontaneous calf hematoma while on anticoagulation.  She had been on oral anticoagulation in the past for afib.  This was stopped recently but she doesn't know why.  She currently in on heparin for a new onset right lower lobe PE.  She currently is requiring 2 L O2.  She developed a large hematom in the right calf with a small one in the left calf.  The left calf has been fairly stable but the right expanded with pain and skin compromise. Other medical problems include hyperlipid hypertension CHF pulmonary HTN all of which have been fairly stable.  She had a negative COVID test yesterday and one week ago.  Past Medical History:  Diagnosis Date  . Arthritis    DJD  . Cancer ([place])    basal cell CA  . Chronic anticoagulation    on Xarelto  . Chronic atrial fibrillation    s/p failed DCCV now on rate control  . Diastolic dysfunction    by echo 07/2012  . Glaucoma   . Hyperlipidemia   . Hypertension   . Pulmonary HTN (HCC)    mild - RVSP 40-68mmHg  . Vocal cord paralysis    Past Surgical History:  Procedure Laterality Date  . BACK SURGERY     C6-C7 discectomy  . basal cell CA removal    . BREAST SURGERY     breast implants  . CARDIOVERSION N/A 09/06/2012   Procedure: CARDIOVERSION;  Surgeon: [name], MD;  Location: [facility];  Service: Cardiovascular;  Laterality: N/A;  . EYE SURGERY  2011   bilateral cataract surgery  . FACIAL COSMETIC SURGERY    . JOINT REPLACEMENT  2003   bilateral hip replacements  . MUSCLE BIOPSY N/A 08/13/2017   Procedure: LEFT QUADRICEPS MUSCLE BIOPSY;  Surgeon: [name], MD;  Location: [facility] OR;  Service: General;  Laterality: N/A;    Family History  Problem Relation Age of Onset  . Alzheimer's disease Mother      SOCIAL HISTORY: Social History   Socioeconomic History  . Marital status: Married    Spouse name: Not on file  . Number of children: Not on file  . Years of education: Not on file  . Highest education level: Not on file  Occupational History  . Not on file  Social Needs  . Financial resource strain: Not on file  . Food insecurity:    Worry: Not on file    Inability: Not on file  . Transportation needs:    Medical: Not on file    Non-medical: Not on file  Tobacco Use  . Smoking status: Former Research scientist (life sciences)  . Smokeless tobacco: Never Used  Substance and Sexual Activity  . Alcohol use: Yes    Comment: occasional wine  . Drug use: No  . Sexual activity: Not on file  Lifestyle  . Physical activity:    Days per week: Not on file    Minutes per session: Not on file  . Stress: Not on file  Relationships  . Social connections:    Talks on phone: Not on file    Gets together: Not on file    Attends religious service: Not on file    Active member of club or organization: Not on file    Attends  meetings of clubs or organizations: Not on file    Relationship status: Not on file  . Intimate partner violence:    Fear of current or ex partner: Not on file    Emotionally abused: Not on file    Physically abused: Not on file    Forced sexual activity: Not on file  Other Topics Concern  . Not on file  Social History Narrative  . Not on file    Allergies  Allergen Reactions  . Cardizem [Diltiazem Hcl] Swelling  . Clonidine Derivatives Swelling  . Doxazosin Swelling    Current Facility-Administered Medications  Medication Dose Route Frequency Provider Last Rate Last Dose  . acetaminophen (TYLENOL) tablet 650 mg  650 mg Oral Q6H PRN Mariel Aloe, MD   650 mg at 12/20/18 9379   Or  . acetaminophen (TYLENOL) suppository 650 mg  650 mg Rectal Q6H PRN Mariel Aloe, MD      . amiodarone (PACERONE) tablet 200 mg  200 mg Oral Daily Mariel Aloe, MD   200 mg at 12/20/18 1004   . carvedilol (COREG) tablet 12.5 mg  12.5 mg Oral BID WC Roney Jaffe, MD   Stopped at 12/20/18 1628  . docusate sodium (COLACE) capsule 100 mg  100 mg Oral BID Mariel Aloe, MD      . ferrous sulfate tablet 325 mg  325 mg Oral TID WC Mariel Aloe, MD   325 mg at 12/20/18 1629  . furosemide (LASIX) tablet 20 mg  20 mg Oral Daily Roney Jaffe, MD   20 mg at 12/20/18 1629  . heparin ADULT infusion 100 units/mL (25000 units/217mL sodium chloride 0.45%)  1,150 Units/hr Intravenous Continuous Dorrene German, RPH 11.5 mL/hr at 12/20/18 0600 1,150 Units/hr at 12/20/18 0600  . HYDROcodone-acetaminophen (NORCO/VICODIN) 5-325 MG per tablet 1 tablet  1 tablet Oral Q6H PRN Mariel Aloe, MD   1 tablet at 12/19/18 1055  . latanoprost (XALATAN) 0.005 % ophthalmic solution 1 drop  1 drop Both Eyes QHS Mariel Aloe, MD   1 drop at 12/19/18 2137  . nystatin (MYCOSTATIN/NYSTOP) topical powder   Topical BID Mariel Aloe, MD      . ondansetron [facility]) tablet 4 mg  4 mg Oral Q6H PRN Mariel Aloe, MD       Or  . ondansetron (ZOFRAN) injection 4 mg  4 mg Intravenous Q6H PRN Mariel Aloe, MD      . polyethylene glycol (MIRALAX / GLYCOLAX) packet 17 g  17 g Oral Daily PRN Mariel Aloe, MD      . predniSONE (DELTASONE) tablet 15 mg  15 mg Oral Q breakfast Mariel Aloe, MD   15 mg at 12/20/18 0725    ROS:   General:  No weight loss, Fever, chills  HEENT: No recent headaches, no nasal bleeding, no visual changes, no sore throat  Neurologic: No dizziness, blackouts, seizures. No recent symptoms of stroke or mini- stroke. No recent episodes of slurred speech, or temporary blindness.  Cardiac: No recent episodes of chest pain/pressure, +shortness of breath at rest.  + shortness of breath with exertion.  + history of atrial fibrillation or irregular heartbeat  Vascular: No history of rest pain in feet.  No history of claudication.  No history of non-healing ulcer, No history of DVT    Pulmonary: No home oxygen, no productive cough, no hemoptysis,  No asthma or wheezing  Musculoskeletal:  [X]  Arthritis, [ ]  Low back  pain,  [X]  Joint pain  Hematologic:No history of hypercoagulable state.  No history of easy bleeding.  No history of anemia  Gastrointestinal: No hematochezia or melena,  No gastroesophageal reflux, no trouble swallowing  Urinary: [ ]  chronic Kidney disease, [ ]  on HD - [ ]  MWF or [ ]  TTHS, [ ]  Burning with urination, [ ]  Frequent urination, [ ]  Difficulty urinating;   Skin: No rashes  Psychological: No history of anxiety,  No history of depression   Physical Examination  Vitals:   12/20/18 1118 12/20/18 1330 12/20/18 1406 12/20/18 1628  BP: 97/62 113/76 110/62 116/75  Pulse: (!) 101  98 87  Resp: 19  18 18   Temp: 98.4 F (36.9 C)  98.6 F (37 C) 98.9 F (37.2 C)  TempSrc: Oral  Oral   SpO2: 97%  99% 99%  Weight:      Height:        Body mass index is 28.75 kg/m.  General:  Alert and oriented, no acute distress HEENT: Normal Neck: No JVD Pulmonary: Clear to auscultation bilaterally Cardiac: Irregularly irregular Abdomen: Soft, non-tender, non-distended Skin: No rash, complete slough of calf skin when removing dressing at bedside           Extremity Pulses:  2+ radial, brachial, 1+ femoral (obese), absent right popliteal dorsalis pedis, posterior tibial pulses right leg, unna boot is on left leg Musculoskeletal: No deformity or edema  Neurologic: Upper and lower extremity motor 5/5 and symmetric  DATA:  BMET    Component Value Date/Time   NA 139 12/20/2018 0400   K 4.4 12/20/2018 0400   CL 107 12/20/2018 0400   CO2 24 12/20/2018 0400   GLUCOSE 90 12/20/2018 0400   BUN 17 12/20/2018 0400   CREATININE 0.68 12/20/2018 0400   CALCIUM 8.2 (L) 12/20/2018 0400   GFRNONAA >60 12/20/2018 0400   GFRAA >60 12/20/2018 0400    CBC    Component Value Date/Time   WBC 11.5 (H) 12/20/2018 0400   RBC 2.87 (L) 12/20/2018 0400    HGB 9.6 (L) 12/20/2018 1401   HCT 30.5 (L) 12/20/2018 1401   HCT 39.2 12/04/2018 0844   PLT 263 12/20/2018 0400   MCV 109.4 (H) 12/20/2018 0400   MCH 34.5 (H) 12/20/2018 0400   MCHC 31.5 12/20/2018 0400   RDW 17.3 (H) 12/20/2018 0400   LYMPHSABS 2.1 12/19/2018 0341   MONOABS 0.6 12/19/2018 0341   EOSABS 0.1 12/19/2018 0341   BASOSABS 0.1 12/19/2018 0341    CTA right leg from a few days ago reviewed long SFA occlusion with reconstitution at tibial origins , large calf hematoma  ASSESSMENT:  Large calf hematoma now resulting in large wound from skin infarction.  Pt with known SFA occlusion.  Foot is pink so most likely this is chronic rather than Afib embolic but still a possibility.  Currently on heparin with recent PE.     PLAN:  Will transfer to [facility] tonight Stop heparin tomorrow 5 AM Will place IVC filter tomorrow since her large bleed now contraindicates anticoagulation Aortogram with bilat runoff possible intervention during same setting as this large wound will have high metabolic demands and I believe she will be at risk for limb loss without increased perfusion.  Plan discussed with pt nurse as well as Dr Jonnie Finner who will arrange transfer tonight.  NPO p midnight AM labs Procedure risk benefits possible complications of bleeding infection possible limb loss discussed.   Ruta Hinds, MD [facility] [place] Office: [phone] Pager: [phone]

## 2018-12-20 NOTE — Progress Notes (Signed)
Pt had large black tarry stool, MD [place] notified, awaiting orders. Pt BP at this time 83/60 with a  MAP of 68. Pt asymptomatic at this time and denies lightheadedness or sweating. MD notified of BP and requested direction to give or hold scheduled lasix and amiodarone.     Will continue to monitor closely.

## 2018-12-20 NOTE — Progress Notes (Signed)
Pt BP now 81/51, MAP 59. MD notified again. Awaiting orders.

## 2018-12-20 NOTE — Progress Notes (Signed)
Report called to receiving RN at [place]. Pt's husband called and notified that patient was transferring. Pt transferred to [facility] via [facility].

## 2018-12-20 NOTE — Progress Notes (Signed)
500cc bolus given and occult stool sample to sent to lab. Will continue to monitor.

## 2018-12-20 NOTE — Progress Notes (Addendum)
[facility] Progress Note  Subjective: pt w/o new c/o's  Vitals:   12/20/18 1045 12/20/18 1118 12/20/18 1330 12/20/18 1406  BP: 104/69 97/62 113/76 110/62  Pulse:  (!) 101  98  Resp: 19 19  18   Temp:  98.4 F (36.9 C)  98.6 F (37 C)  TempSrc:  Oral  Oral  SpO2:  97%  99%  Weight:      Height:        Inpatient medications: . amiodarone  200 mg Oral Daily  . docusate sodium  100 mg Oral BID  . ferrous sulfate  325 mg Oral TID WC  . latanoprost  1 drop Both Eyes QHS  . nystatin   Topical BID  . predniSONE  15 mg Oral Q breakfast   . heparin 1,150 Units/hr (12/20/18 0600)   acetaminophen **OR** acetaminophen, HYDROcodone-acetaminophen, ondansetron **OR** ondansetron (ZOFRAN) IV, polyethylene glycol  Exam:  alert, looks tired and frustrated  no jvd  Chest cta bilat to bases  Cor reg no mrg  Abd soft ntnd +bs  Ext diffuse pitting 1-2+ edema throughout bilat LE's, large open wound / hematoma of post R calf; smaller wound per RN on L calf not unwrapped today  Neuro NF Ox 3   Presentation Summary: Kelsey [name] is a 85 y.o. female with medical history significant of chronic atrial fibrillation, chronic systolic heart failure, pulmonary hypertension, hyperlipidemia, right leg hematoma, recent PE/pulmonary infarct.  Patient presents secondary to 1 day history of worsening shortness of breath.  She reports having some shortness of breath for the last week does worsen.  She also has some bleeding from her right leg wound for which she receives home health nursing wound care.  She reports no chest pain, nausea, vomiting, abdominal pain.  She has persistent leg swelling ED Course: Vitals: Afebrile, pulse of 1 teens to 120s, respirations of 17-20, SPO2 of low 90s on 2 L via nasal cannula Labs: Hemoglobin of 10.1, BNP of 46 Imaging: CT chest significant for small right lower lobe PE and pulmonary infarct Medications/Course: None   Home meds:  - lasix 20 qd prn/  carvedilol 25 bid/ amiodarone 200 qd     Hospital Problems/ Course:  Acute pulmonary embolism/ RLL small infarct by CT:  Mildly symptomatic with associated hypoxia. Recently taken off of anticoagulation secondary to blood loss anemia and R post calf hematoma. No evidence of heart strain on CT and patient with a recent Transthoracic Echocardiogram two weeks ago. Upon chart review, this was first noticed 9 days ago incidentally on CT abdomen/pelvis - back on IV heparin now for PE (small clot burden), prev was on it for atrial fib but was taken off last admit due to the leg hematoma - main issue now is the R calf wound/ hematoma, will have to weigh risks and benefits of continued anticoagulation for PE vs the risk of tissue loss in the leg; have consulted Dr Oneida Alar [facility] who will see the patient  Leg wounds/weeping R calf hematoma Patient is followed by home health and receives wound care. -WOC consult appreciated - [facility] consult pending as above  Chronic systolic heart failure EF of 25-30% from Transthoracic Echocardiogram on 5/14. Significant LE edema with no evidence of acute exacerbation. BNP of 486 today; 1500 two weeks prior. - restart home Lasix 20 daily - cont coreg at lower dose 12.5 bid for softer BP's - Daily weights and strict in/out  Fever: new fever spike last night 5/31 to 102 deg -  no new symptoms, cough, etc > will get blood cx's x 2, send UA  Paroxysmal atrial fibrillation Slight tachycardia in setting of acute PE - on home amiodarone, Coreg - heart rate labile 80's- 115   Macrocytic anemia - Normal folate/vitamin B12. Stable from discharge -CBC as mentioned above  Chronic back pain Patient is on steroids chronically in addition to Norco as needed -Continue Prednisone and Norco prn  DNR - patient is full DNR   DVT prophylaxis: Heparin drip Code Status: DNR Family Communication: None Disposition Plan: Telemetry Consults called: None Admission status:  inpatient    Kelly Splinter / [facility] [phone] 12/20/2018, 3:43 PM   Imaging:  CTA chest on 5/31 > FINDINGS: Cardiovascular: Satisfactory opacification of the bilateral pulmonary arteries to the segmental level. Segmental/subsegmental right lower lobe pulmonary emboli (series 5/images 137, 147, and 160). Overall clot burden is mild. No evidence of right heart strain.... Moderate centrilobular and paraseptal emphysematous changes, upper lung predominant.  Scattered calcified granulomata throughout the lungs bilaterally. Stable patchy subpleural right basilar opacity, unchanged from recent CT abdomen/pelvis, likely reflecting a pulmonary infarct in this context.    Recent Labs  Lab 12/19/18 0341 12/20/18 0400  NA 142 139  K 4.5 4.4  CL 110 107  CO2 24 24  GLUCOSE 84 90  BUN 22 17  CREATININE 0.80 0.68  CALCIUM 8.7* 8.2*   No results for input(s): AST, ALT, ALKPHOS, BILITOT, PROT, ALBUMIN in the last 168 hours. Recent Labs  Lab 12/19/18 0341 12/19/18 1945 12/20/18 0400 12/20/18 1401  WBC 9.4  --  11.5*  --   NEUTROABS 6.2  --   --   --   HGB 10.1* 9.1* 9.9* 9.6*  HCT 33.7* 29.6* 31.4* 30.5*  MCV 112.3*  --  109.4*  --   PLT 298  --  263  --    Iron/TIBC/Ferritin/ %Sat No results found for: IRON, TIBC, FERRITIN, IRONPCTSAT

## 2018-12-20 NOTE — H&P (View-Only) (Signed)
Referring Physician: Dr Jonnie Finner  Patient name: Kelsey [name] MRN: [id number] DOB: [birth year] Sex: female  REASON FOR CONSULT: right calf hematoma  HPI: Kelsey [name] is a 85 y.o. female, who developed a spontaneous calf hematoma while on anticoagulation.  She had been on oral anticoagulation in the past for afib.  This was stopped recently but she doesn't know why.  She currently in on heparin for a new onset right lower lobe PE.  She currently is requiring 2 L O2.  She developed a large hematom in the right calf with a small one in the left calf.  The left calf has been fairly stable but the right expanded with pain and skin compromise. Other medical problems include hyperlipid hypertension CHF pulmonary HTN all of which have been fairly stable.  She had a negative COVID test yesterday and one week ago.  Past Medical History:  Diagnosis Date  . Arthritis    DJD  . Cancer ([place])    basal cell CA  . Chronic anticoagulation    on Xarelto  . Chronic atrial fibrillation    s/p failed DCCV now on rate control  . Diastolic dysfunction    by echo 07/2012  . Glaucoma   . Hyperlipidemia   . Hypertension   . Pulmonary HTN (HCC)    mild - RVSP 40-1mmHg  . Vocal cord paralysis    Past Surgical History:  Procedure Laterality Date  . BACK SURGERY     C6-C7 discectomy  . basal cell CA removal    . BREAST SURGERY     breast implants  . CARDIOVERSION N/A 09/06/2012   Procedure: CARDIOVERSION;  Surgeon: [name], MD;  Location: [facility];  Service: Cardiovascular;  Laterality: N/A;  . EYE SURGERY  2011   bilateral cataract surgery  . FACIAL COSMETIC SURGERY    . JOINT REPLACEMENT  2003   bilateral hip replacements  . MUSCLE BIOPSY N/A 08/13/2017   Procedure: LEFT QUADRICEPS MUSCLE BIOPSY;  Surgeon: [name], MD;  Location: [facility] OR;  Service: General;  Laterality: N/A;    Family History  Problem Relation Age of Onset  . Alzheimer's disease Mother      SOCIAL HISTORY: Social History   Socioeconomic History  . Marital status: Married    Spouse name: Not on file  . Number of children: Not on file  . Years of education: Not on file  . Highest education level: Not on file  Occupational History  . Not on file  Social Needs  . Financial resource strain: Not on file  . Food insecurity:    Worry: Not on file    Inability: Not on file  . Transportation needs:    Medical: Not on file    Non-medical: Not on file  Tobacco Use  . Smoking status: Former Research scientist (life sciences)  . Smokeless tobacco: Never Used  Substance and Sexual Activity  . Alcohol use: Yes    Comment: occasional wine  . Drug use: No  . Sexual activity: Not on file  Lifestyle  . Physical activity:    Days per week: Not on file    Minutes per session: Not on file  . Stress: Not on file  Relationships  . Social connections:    Talks on phone: Not on file    Gets together: Not on file    Attends religious service: Not on file    Active member of club or organization: Not on file    Attends  meetings of clubs or organizations: Not on file    Relationship status: Not on file  . Intimate partner violence:    Fear of current or ex partner: Not on file    Emotionally abused: Not on file    Physically abused: Not on file    Forced sexual activity: Not on file  Other Topics Concern  . Not on file  Social History Narrative  . Not on file    Allergies  Allergen Reactions  . Cardizem [Diltiazem Hcl] Swelling  . Clonidine Derivatives Swelling  . Doxazosin Swelling    Current Facility-Administered Medications  Medication Dose Route Frequency Provider Last Rate Last Dose  . acetaminophen (TYLENOL) tablet 650 mg  650 mg Oral Q6H PRN Mariel Aloe, MD   650 mg at 12/20/18 5409   Or  . acetaminophen (TYLENOL) suppository 650 mg  650 mg Rectal Q6H PRN Mariel Aloe, MD      . amiodarone (PACERONE) tablet 200 mg  200 mg Oral Daily Mariel Aloe, MD   200 mg at 12/20/18 1004   . carvedilol (COREG) tablet 12.5 mg  12.5 mg Oral BID WC Roney Jaffe, MD   Stopped at 12/20/18 1628  . docusate sodium (COLACE) capsule 100 mg  100 mg Oral BID Mariel Aloe, MD      . ferrous sulfate tablet 325 mg  325 mg Oral TID WC Mariel Aloe, MD   325 mg at 12/20/18 1629  . furosemide (LASIX) tablet 20 mg  20 mg Oral Daily Roney Jaffe, MD   20 mg at 12/20/18 1629  . heparin ADULT infusion 100 units/mL (25000 units/261mL sodium chloride 0.45%)  1,150 Units/hr Intravenous Continuous Dorrene German, RPH 11.5 mL/hr at 12/20/18 0600 1,150 Units/hr at 12/20/18 0600  . HYDROcodone-acetaminophen (NORCO/VICODIN) 5-325 MG per tablet 1 tablet  1 tablet Oral Q6H PRN Mariel Aloe, MD   1 tablet at 12/19/18 1055  . latanoprost (XALATAN) 0.005 % ophthalmic solution 1 drop  1 drop Both Eyes QHS Mariel Aloe, MD   1 drop at 12/19/18 2137  . nystatin (MYCOSTATIN/NYSTOP) topical powder   Topical BID Mariel Aloe, MD      . ondansetron [facility]) tablet 4 mg  4 mg Oral Q6H PRN Mariel Aloe, MD       Or  . ondansetron (ZOFRAN) injection 4 mg  4 mg Intravenous Q6H PRN Mariel Aloe, MD      . polyethylene glycol (MIRALAX / GLYCOLAX) packet 17 g  17 g Oral Daily PRN Mariel Aloe, MD      . predniSONE (DELTASONE) tablet 15 mg  15 mg Oral Q breakfast Mariel Aloe, MD   15 mg at 12/20/18 0725    ROS:   General:  No weight loss, Fever, chills  HEENT: No recent headaches, no nasal bleeding, no visual changes, no sore throat  Neurologic: No dizziness, blackouts, seizures. No recent symptoms of stroke or mini- stroke. No recent episodes of slurred speech, or temporary blindness.  Cardiac: No recent episodes of chest pain/pressure, +shortness of breath at rest.  + shortness of breath with exertion.  + history of atrial fibrillation or irregular heartbeat  Vascular: No history of rest pain in feet.  No history of claudication.  No history of non-healing ulcer, No history of DVT    Pulmonary: No home oxygen, no productive cough, no hemoptysis,  No asthma or wheezing  Musculoskeletal:  [X]  Arthritis, [ ]  Low back  pain,  [X]  Joint pain  Hematologic:No history of hypercoagulable state.  No history of easy bleeding.  No history of anemia  Gastrointestinal: No hematochezia or melena,  No gastroesophageal reflux, no trouble swallowing  Urinary: [ ]  chronic Kidney disease, [ ]  on HD - [ ]  MWF or [ ]  TTHS, [ ]  Burning with urination, [ ]  Frequent urination, [ ]  Difficulty urinating;   Skin: No rashes  Psychological: No history of anxiety,  No history of depression   Physical Examination  Vitals:   12/20/18 1118 12/20/18 1330 12/20/18 1406 12/20/18 1628  BP: 97/62 113/76 110/62 116/75  Pulse: (!) 101  98 87  Resp: 19  18 18   Temp: 98.4 F (36.9 C)  98.6 F (37 C) 98.9 F (37.2 C)  TempSrc: Oral  Oral   SpO2: 97%  99% 99%  Weight:      Height:        Body mass index is 28.75 kg/m.  General:  Alert and oriented, no acute distress HEENT: Normal Neck: No JVD Pulmonary: Clear to auscultation bilaterally Cardiac: Irregularly irregular Abdomen: Soft, non-tender, non-distended Skin: No rash, complete slough of calf skin when removing dressing at bedside           Extremity Pulses:  2+ radial, brachial, 1+ femoral (obese), absent right popliteal dorsalis pedis, posterior tibial pulses right leg, unna boot is on left leg Musculoskeletal: No deformity or edema  Neurologic: Upper and lower extremity motor 5/5 and symmetric  DATA:  BMET    Component Value Date/Time   NA 139 12/20/2018 0400   K 4.4 12/20/2018 0400   CL 107 12/20/2018 0400   CO2 24 12/20/2018 0400   GLUCOSE 90 12/20/2018 0400   BUN 17 12/20/2018 0400   CREATININE 0.68 12/20/2018 0400   CALCIUM 8.2 (L) 12/20/2018 0400   GFRNONAA >60 12/20/2018 0400   GFRAA >60 12/20/2018 0400    CBC    Component Value Date/Time   WBC 11.5 (H) 12/20/2018 0400   RBC 2.87 (L) 12/20/2018 0400    HGB 9.6 (L) 12/20/2018 1401   HCT 30.5 (L) 12/20/2018 1401   HCT 39.2 12/04/2018 0844   PLT 263 12/20/2018 0400   MCV 109.4 (H) 12/20/2018 0400   MCH 34.5 (H) 12/20/2018 0400   MCHC 31.5 12/20/2018 0400   RDW 17.3 (H) 12/20/2018 0400   LYMPHSABS 2.1 12/19/2018 0341   MONOABS 0.6 12/19/2018 0341   EOSABS 0.1 12/19/2018 0341   BASOSABS 0.1 12/19/2018 0341    CTA right leg from a few days ago reviewed long SFA occlusion with reconstitution at tibial origins , large calf hematoma  ASSESSMENT:  Large calf hematoma now resulting in large wound from skin infarction.  Pt with known SFA occlusion.  Foot is pink so most likely this is chronic rather than Afib embolic but still a possibility.  Currently on heparin with recent PE.     PLAN:  Will transfer to [facility] tonight Stop heparin tomorrow 5 AM Will place IVC filter tomorrow since her large bleed now contraindicates anticoagulation Aortogram with bilat runoff possible intervention during same setting as this large wound will have high metabolic demands and I believe she will be at risk for limb loss without increased perfusion.  Plan discussed with pt nurse as well as Dr Jonnie Finner who will arrange transfer tonight.  NPO p midnight AM labs Procedure risk benefits possible complications of bleeding infection possible limb loss discussed.   Ruta Hinds, MD [facility] [place] Office: [phone] Pager: [phone]

## 2018-12-20 NOTE — Progress Notes (Signed)
LE venous duplex       has been completed. Preliminary results can be found under CV proc through chart review. [place], BS, RDMS, RVT   

## 2018-12-20 NOTE — Progress Notes (Signed)
Pharmacy: Re- heparin  Patient's an 85 y.o F with recent hx PE/pulmonary infarct (per abd CT on 12/10/18) and right leg hematoma, presented to th e ED on 5/31 with c/o SOB. Of note, discharge note on 12/10/18 indicated that anticoag. was held d/t severe calf hematoma. Chest CT angio on 5/31 showed "segmental/subsegmental right lower lobe pulmonary emboli."  Heparin drip started on admission for PE.  -0400 HL = 0.28 below goal  (goal 0.3-0.7) - RN reports some IV site issues, but pt now with new site that is running well. No bleeding reported  - hgb = 9.9  Plan: - increase heparin drip to 1150 units/hr - Recheck HL in 8 hours - monitor for s/s bleeding -Daily CBC/HL  Dorrene German 12/20/2018 5:17 AM

## 2018-12-20 NOTE — Consult Note (Signed)
[facility] Nurse wound consult note Reason for Consult: Chronic edema.  Nonhealing wounds to bilateral lower legs. Ultrasound this AM was negative for DVT.  Trauma to left medial lower leg Blood filled hematoma to right medial lower leg.  Has ruptured with bleeding, ruddy red wound bed. Tender to touch Wound type:Trauma Pressure Injury POA: NA Measurement: Right leg hematoma: 12 cm x 3 cm x 0.3 cm ruptured hematoma Left medial lower leg:  1 cm x 1 cm x 0.2 cm round nonhealing abrasion yellow wound bed.  Wound bed:see above Drainage (amount, consistency, odor) Right leg moderate serosanguinous oozing Left leg weeping  Periwound:Edema and chronic skin changes. Wears removable compression garment at home and has [facility] assisting with wound care. Dressing procedure/placement/frequency: Cleanse wounds to bilateral lower legs with NS and pat dry  Apply vaseline gauze to wound bed.  Cover both wounds with small piece (cut to fit) of Aquacel Ag.  Cover with gauze. Ortho tech to wrap legs in [employer].  Change twice weekly.  Monday and Thursday   Will not follow at this time.  Please re-consult if needed.  Domenic Moras MSN, RN, FNP-BC CWON Wound, Ostomy, Continence Nurse Pager [phone]

## 2018-12-21 ENCOUNTER — Encounter (HOSPITAL_COMMUNITY)
Admission: EM | Disposition: A | Discharge status: Hospice | Payer: Self-pay | Source: Home / Self Care | Attending: Internal Medicine

## 2018-12-21 ENCOUNTER — Encounter (HOSPITAL_COMMUNITY): Payer: Self-pay | Admitting: Surgery

## 2018-12-21 DIAGNOSIS — I2694 Multiple subsegmental pulmonary emboli without acute cor pulmonale: Secondary | ICD-10-CM

## 2018-12-21 DIAGNOSIS — I272 Pulmonary hypertension, unspecified: Secondary | ICD-10-CM

## 2018-12-21 DIAGNOSIS — I2699 Other pulmonary embolism without acute cor pulmonale: Secondary | ICD-10-CM

## 2018-12-21 HISTORY — PX: IVC FILTER INSERTION: CATH118245

## 2018-12-21 HISTORY — PX: ABDOMINAL AORTOGRAM W/LOWER EXTREMITY: CATH118223

## 2018-12-21 HISTORY — PX: PERIPHERAL VASCULAR INTERVENTION: CATH118257

## 2018-12-21 LAB — BASIC METABOLIC PANEL
Anion gap: 8 (ref 5–15)
BUN: 15 mg/dL (ref 8–23)
CO2: 28 mmol/L (ref 22–32)
Calcium: 8.2 mg/dL — ABNORMAL LOW (ref 8.9–10.3)
Chloride: 104 mmol/L (ref 98–111)
Creatinine, Ser: 0.87 mg/dL (ref 0.44–1.00)
GFR calc Af Amer: >60 mL/min (ref >60)
GFR calc non Af Amer: >60 mL/min (ref >60)
Glucose, Bld: 92 mg/dL (ref 70–99)
Potassium: 4.1 mmol/L (ref 3.5–5.1)
Sodium: 140 mmol/L (ref 135–145)

## 2018-12-21 LAB — CBC
HCT: 29.8 % — ABNORMAL LOW (ref 36.0–46.0)
Hemoglobin: 9.4 g/dL — ABNORMAL LOW (ref 12.0–15.0)
MCH: 33.1 pg (ref 26.0–34.0)
MCHC: 31.5 g/dL (ref 30.0–36.0)
MCV: 104.9 fL — ABNORMAL HIGH (ref 80.0–100.0)
Platelets: 201 10*3/uL (ref 150–400)
RBC: 2.84 MIL/uL — ABNORMAL LOW (ref 3.87–5.11)
RDW: 16.6 % — ABNORMAL HIGH (ref 11.5–15.5)
WBC: 11.3 10*3/uL — ABNORMAL HIGH (ref 4.0–10.5)
nRBC: 0.3 % — ABNORMAL HIGH (ref 0.0–0.2)

## 2018-12-21 LAB — POCT ACTIVATED CLOTTING TIME: Activated Clotting Time: 230 seconds

## 2018-12-21 LAB — HEPARIN LEVEL (UNFRACTIONATED): Heparin Unfractionated: 0.57 IU/mL (ref 0.30–0.70)

## 2018-12-21 SURGERY — IVC FILTER INSERTION
Anesthesia: LOCAL | Laterality: Right

## 2018-12-21 MED ORDER — IODIXANOL 320 MG/ML IV SOLN
INTRAVENOUS | Status: DC | PRN
  Administered 2018-12-21: 190 mL via INTRAVENOUS

## 2018-12-21 MED ORDER — FENTANYL CITRATE (PF) 100 MCG/2ML IJ SOLN
INTRAMUSCULAR | Status: AC
  Filled 2018-12-21: qty 2

## 2018-12-21 MED ORDER — HEPARIN SODIUM (PORCINE) 1000 UNIT/ML IJ SOLN
INTRAMUSCULAR | Status: DC | PRN
  Administered 2018-12-21: 1000 [IU] via INTRAVENOUS
  Administered 2018-12-21: 8000 [IU] via INTRAVENOUS

## 2018-12-21 MED ORDER — LIDOCAINE HCL (PF) 1 % IJ SOLN
INTRAMUSCULAR | Status: DC | PRN
  Administered 2018-12-21: 20 mL via INTRADERMAL
  Administered 2018-12-21: 10 mL via INTRADERMAL

## 2018-12-21 MED ORDER — METOPROLOL TARTRATE 5 MG/5ML IV SOLN
2.5000 mg | INTRAVENOUS | Status: DC | PRN
  Administered 2018-12-21: 15:00:00 2.5 mg via INTRAVENOUS
  Filled 2018-12-21: qty 5

## 2018-12-21 MED ORDER — LIDOCAINE HCL (PF) 1 % IJ SOLN
INTRAMUSCULAR | Status: AC
  Filled 2018-12-21: qty 30

## 2018-12-21 MED ORDER — HYDRALAZINE HCL 20 MG/ML IJ SOLN: 5.0000 mg | INTRAMUSCULAR | Status: DC | PRN

## 2018-12-21 MED ORDER — HEPARIN (PORCINE) IN NACL 1000-0.9 UT/500ML-% IV SOLN
INTRAVENOUS | Status: AC
  Filled 2018-12-21: qty 1000

## 2018-12-21 MED ORDER — FENTANYL CITRATE (PF) 100 MCG/2ML IJ SOLN
INTRAMUSCULAR | Status: DC | PRN
  Administered 2018-12-21 (×4): 25 ug via INTRAVENOUS

## 2018-12-21 MED ORDER — ROSUVASTATIN CALCIUM 5 MG PO TABS
10.0000 mg | ORAL_TABLET | Freq: Every day | ORAL | Status: DC
  Administered 2018-12-21: 18:00:00 10 mg via ORAL
  Filled 2018-12-21: qty 2

## 2018-12-21 MED ORDER — SODIUM CHLORIDE 0.9 % IV SOLN
INTRAVENOUS | Status: AC | PRN
  Administered 2018-12-21: 10 mL/h via INTRAVENOUS

## 2018-12-21 MED ORDER — MORPHINE SULFATE (PF) 10 MG/ML IV SOLN: 2.0000 mg | INTRAVENOUS | Status: DC | PRN

## 2018-12-21 MED ORDER — SODIUM CHLORIDE 0.9 % WEIGHT BASED INFUSION
1.0000 mL/kg/h | INTRAVENOUS | Status: AC
  Administered 2018-12-21: 1 mL/kg/h via INTRAVENOUS

## 2018-12-21 MED ORDER — ASPIRIN EC 81 MG PO TBEC
81.0000 mg | DELAYED_RELEASE_TABLET | Freq: Every day | ORAL | Status: DC
  Administered 2018-12-21 – 2018-12-22 (×2): 81 mg via ORAL
  Filled 2018-12-21 (×2): qty 1

## 2018-12-21 MED ORDER — ACETAMINOPHEN 325 MG PO TABS
650.0000 mg | ORAL_TABLET | ORAL | Status: DC | PRN
  Administered 2018-12-22: 650 mg via ORAL
  Filled 2018-12-21: qty 2

## 2018-12-21 MED ORDER — ONDANSETRON HCL 4 MG/2ML IJ SOLN: 4.0000 mg | Freq: Four times a day (QID) | INTRAMUSCULAR | Status: DC | PRN

## 2018-12-21 MED ORDER — SODIUM CHLORIDE 0.9% FLUSH
3.0000 mL | Freq: Two times a day (BID) | INTRAVENOUS | Status: DC
  Administered 2018-12-21 – 2018-12-24 (×7): 3 mL via INTRAVENOUS

## 2018-12-21 MED ORDER — LABETALOL HCL 5 MG/ML IV SOLN: 10.0000 mg | INTRAVENOUS | Status: DC | PRN

## 2018-12-21 MED ORDER — CLOPIDOGREL BISULFATE 75 MG PO TABS
75.0000 mg | ORAL_TABLET | Freq: Every day | ORAL | Status: DC
  Administered 2018-12-22: 75 mg via ORAL
  Filled 2018-12-21: qty 1

## 2018-12-21 MED ORDER — SODIUM CHLORIDE 0.9% FLUSH: 3.0000 mL | INTRAVENOUS | Status: DC | PRN

## 2018-12-21 MED ORDER — SODIUM CHLORIDE 0.9 % IV SOLN
INTRAVENOUS | Status: DC
  Administered 2018-12-21 – 2018-12-23 (×4): via INTRAVENOUS

## 2018-12-21 MED ORDER — OXYCODONE HCL 5 MG PO TABS: 5.0000 mg | ORAL_TABLET | ORAL | Status: DC | PRN

## 2018-12-21 MED ORDER — HEPARIN (PORCINE) IN NACL 1000-0.9 UT/500ML-% IV SOLN
INTRAVENOUS | Status: DC | PRN
  Administered 2018-12-21 (×2): 500 mL

## 2018-12-21 MED ORDER — MIDAZOLAM HCL 2 MG/2ML IJ SOLN
INTRAMUSCULAR | Status: DC | PRN
  Administered 2018-12-21: 1 mg via INTRAVENOUS

## 2018-12-21 MED ORDER — SODIUM CHLORIDE 0.9 % IV SOLN: 250.0000 mL | INTRAVENOUS | Status: DC | PRN

## 2018-12-21 MED ORDER — MIDAZOLAM HCL 2 MG/2ML IJ SOLN
INTRAMUSCULAR | Status: AC
  Filled 2018-12-21: qty 2

## 2018-12-21 SURGICAL SUPPLY — 26 items
BALLN MUSTANG 5X200X135 (BALLOONS) ×3
BALLOON MUSTANG 5X200X135 (BALLOONS) ×2 IMPLANT
CATH MUSTANG 4X200X135 (BALLOONS) ×3 IMPLANT
CATH OMNI FLUSH 5F 65CM (CATHETERS) ×3 IMPLANT
CATH QUICKCROSS SUPP .035X90CM (MICROCATHETER) ×3 IMPLANT
CLOSURE MYNX CONTROL 6F/7F (Vascular Products) ×3 IMPLANT
COVER DOME SNAP 22 D (MISCELLANEOUS) ×3 IMPLANT
DEVICE TORQUE H2O (MISCELLANEOUS) ×3 IMPLANT
FILTER VC CELECT-FEMORAL (Filter) ×3 IMPLANT
GUIDEWIRE ANGLED .035X150CM (WIRE) ×3 IMPLANT
KIT ENCORE 26 ADVANTAGE (KITS) ×3 IMPLANT
KIT MICROPUNCTURE NIT STIFF (SHEATH) ×3 IMPLANT
KIT PV (KITS) ×3 IMPLANT
SHEATH HIGHFLEX ANSEL 7FR 55CM (SHEATH) ×3 IMPLANT
SHEATH PINNACLE 5F 10CM (SHEATH) ×3 IMPLANT
SHEATH PINNACLE 7F 10CM (SHEATH) ×3 IMPLANT
SHIELD RADPAD SCOOP 12X17 (MISCELLANEOUS) ×3 IMPLANT
STENT ELUVIA 6X120X130 (Permanent Stent) ×6 IMPLANT
STENT ELUVIA 6X60X130 (Permanent Stent) ×3 IMPLANT
SYR MEDRAD MARK V 150ML (SYRINGE) ×3 IMPLANT
TAPE VIPERTRACK RADIOPAQ (MISCELLANEOUS) ×2 IMPLANT
TAPE VIPERTRACK RADIOPAQUE (MISCELLANEOUS) ×1
TRANSDUCER W/STOPCOCK (MISCELLANEOUS) ×3 IMPLANT
TRAY PV CATH (CUSTOM PROCEDURE TRAY) ×3 IMPLANT
WIRE BENTSON .035X145CM (WIRE) ×3 IMPLANT
WIRE HI TORQ VERSACORE J 260CM (WIRE) ×3 IMPLANT

## 2018-12-21 NOTE — Op Note (Signed)
Patient name: Kelsey [name] MRN: [id number] DOB: 10-29-1931 Sex: female  12/21/2018 Pre-operative Diagnosis: Right leg wound Post-operative diagnosis:  Same Surgeon:  Annamarie Major Procedure Performed:  1.  Ultrasound-guided access, left femoral artery  2.  Abdominal aortogram  3.  Bilateral lower extremity runoff  4.  Stent, right superficial femoral artery  5.  Closure device (Mynx  6.  Ultrasound-guided access, right femoral vein  7.  Inferior venacavogram   8.  Placement of inferior vena cava filter  9.  Conscious sedation (87 minutes)   Indications: The patient has developed a large right calf wound secondary to hematoma from anticoagulation.  She is also developed a PE.  She comes in today for right leg intervention and filter placement  Procedure:  The patient was identified in the holding area and taken to room 8.  The patient was then placed supine on the table and prepped and draped in the usual sterile fashion.  A time out was called.  Conscious sedation was administered with the use of IV fentanyl and Versed under continuous physician and nurse monitoring.  Heart rate, blood pressure, and oxygen saturation were continuously monitored.  Total sedation time was 87 minutes.    The right common femoral vein was evaluated with ultrasound.  A digital image was acquired.  The vein was easily compressible.  1% lidocaine was used for local anesthesia.  The right common femoral vein was then cannulated under ultrasound guidance with a micropuncture needle.  An 018 wire was advanced without resistance a micropuncture sheath was placed.  A Bentson wire was then inserted.  The micropuncture sheath was removed and a Omni Flush catheter was placed into the inferior vena cava and inferior venacavogram was performed which located the renal veins bilaterally.  The diameter of the vena cava was 16 mm.  Next, the filter introducing sheath was inserted.  A Cook Celect retrievable filter  was then deployed with the tip of the filter landing at the top of L3 which is at the renal vein confluence.  The sheath was then removed and manual pressure was held.    Next, ultrasound was used to evaluate the left common femoral artery.  It was patent .  A digital ultrasound image was acquired.  A micropuncture needle was used to access the left common femoral artery under ultrasound guidance.  An 018 wire was advanced without resistance and a micropuncture sheath was placed.  The 018 wire was removed and a benson wire was placed.  The micropuncture sheath was exchanged for a 5 french sheath.  An omniflush catheter was advanced over the wire to the level of L-1.  An abdominal angiogram was obtained.  The Omni Flush catheter was then pulled down to the aortic bifurcation and bilateral runoff was performed.    Findings:   Aortogram: No significant renal artery stenosis however the renal arteries are small in caliber.  The infrarenal abdominal aorta is widely patent.  Bilateral common and external iliac arteries are widely patent.  Right Lower Extremity: Right common femoral and profundofemoral artery are widely patent.  The superficial femoral artery is occluded at its origin.  There is reconstitution at the adductor canal.  The popliteal arteries pain throughout its course with single-vessel runoff via the peroneal artery.  Left Lower Extremity: Left, within profundofemoral and superficial femoral artery are patent throughout the course.  Popliteal arteries widely patent.  Intervention: After the above images were acquired the decision was made to proceed with  intervention.  Using the Omni Flush catheter and a Bentson wire the aortic bifurcation was crossed.  A 7 French 55 cm high flex Ansell 1 sheath was then inserted into the right external iliac artery.  The patient was fully heparinized.  Next using a 035 Glidewire and quick cross, subintimal recanalization was performed.  Successful reentry into  the above-knee popliteal artery was performed and confirmed with a contrast injection.  A Woolley wire was then inserted.  The subintimal tract was then predilated with a 4 mm Mustang balloon.  I then deployed overlapping Elluvia stents.  First a 6 x 120 followed by a 6 x 120, and then a 6 x 60 stent were deployed.  They were postdilated with a 5 mm balloon.  Completion imaging revealed widely patent inline flow down to the ankle.  At this point the long sheath was exchanged out for short 6 French sheath and a minx device was used for closure.  Impression:  #1  Occluded right superficial femoral artery, successfully crossed and stented using overlapping 6 mm Elluvia stents.  #2  Placement of retrievable vena cava filter     V. Annamarie Major, M.D., [facility] [place] Office: [phone] Pager:  [phone]

## 2018-12-21 NOTE — Progress Notes (Addendum)
PROGRESS NOTE    Danyeal Akens Robinette-Hagie   MBT:[id number]  DOB: 1932-04-26  DOA: 12/19/2018 PCP: [name], MD   Brief Narrative:  MAHNOOR MATHISEN is an 85 y/o with spinal stenosis (moslty sedentary) chronic atrial fibrillation off of Xarelto due to "blotches on her hand", PVD,  breast implants, chronic systolic heart failure, pulmonary hypertension, hyperlipidemia, vocal cord paralysis, septic shock, AKI and incidental PT on last admission. She also was found on 5/19  to have a right calf hematoma without any known injury. She was on full dose Lovenox in the hospital and it was discontinued after this hematoma was found. She was later found to have a PE when a CT of the abd/pelvis. PE was in the RL base and was associated with a right subsegmental infarct. She had no symptoms and no DVT was found on venous duplex.  She returns to [facility] shortness of breath and is found to be hypoxic to 85% on RA and required 4 L. CTA Chest>  Segmental/subsegmental right lower lobe pulmonary emboli, as above. Overall clot burden is mild. Transferred to [facility] for IVC filter.   Subjective: She has no complaints.   Assessment & Plan:   Principal Problem:   Pulmonary emboli with pulmonary infarct/ acute respiratory failure - unable to anticoagulate due to calf hematomas bilaterally - she has received an IVC filter today by vascular surgery and had an Aortogram with b/l runoff due to PVD- see below - currently on heparin infusion but will need to d/c without AC - wean O2 as able - pain control with IV Morphine and Hydrocodone PRN  Active Problems: Calf hematoma and severe PVD - apparently was a spontaneous hematoma while on anticoagulation in hospital (full dose Lovenox) noted on 5/19 - Right SFA stent should allow for increased perfusion to let wound heal - vascular surgery has started ASA and Plavix - legs currently wrapped  Fever 102.4 on 5/31 - this is concerning because  she was just admitted for septic source but source was not found- she completed 7 days of Cefepine and Vancomycin - now ~ 99 - blood cultures sent on 6/1 - UA negative    Hypertension,   Chronic systolic CHF (EF 53-64%),  Pulmonary HTN  -cont Coreg, lasix    Chronic atrial fibrillation with RVR  - cont Coreg, Amiodarone- tachycardic post op   - avoid AC - she was previously on Xarelto  -adding IV Lopressor PRN   Chronic back pain - cont Prednisone 15 mg daily and Norco PRN  Time spent in minutes: 35 DVT prophylaxis: heparin infusion Code Status: DNR Family Communication:  Disposition Plan: will need PT eval Consultants:   Vascular surgery Procedures:   6/2 1.  Ultrasound-guided access, left femoral artery             2.  Abdominal aortogram             3.  Bilateral lower extremity runoff             4.  Stent, right superficial femoral artery             5.  Closure device (Mynx             6.  Ultrasound-guided access, right femoral vein             7.  Inferior venacavogram                    8.  Placement of inferior vena cava filter             9.  Conscious sedation (87 minutes) Antimicrobials:  Anti-infectives (From admission, onward)   None       Objective: Vitals:   12/21/18 0912 12/21/18 0916 12/21/18 0921 12/21/18 0950  BP: 127/66 134/76 130/75 105/62  Pulse: (!) 0 (!) 126 (!) 110 (!) 117  Resp: 15 (!) 8 15 20   Temp:      TempSrc:      SpO2: 96% 96% 96% 94%  Weight:      Height:        Intake/Output Summary (Last 24 hours) at 12/21/2018 1137 Last data filed at 12/21/2018 1011 Gross per 24 hour  Intake 939.27 ml  Output 1750 ml  Net -810.73 ml   Filed Weights   12/19/18 1045 12/20/18 2038 12/21/18 0500  Weight: 80.8 kg 79.1 kg 81.6 kg    Examination: General exam: Appears comfortable  HEENT: PERRLA, oral mucosa moist, no sclera icterus or thrush Respiratory system: Clear to auscultation. Respiratory effort normal. Cardiovascular system: S1  & S2 heard, IIRR.   Gastrointestinal system: Abdomen soft, non-tender, nondistended. Normal bowel sounds. Central nervous system: Alert and oriented. No focal neurological deficits. Extremities: No cyanosis, clubbing or edema Skin: both legs are wrapped Psychiatry:  Mood & affect appropriate.     Data Reviewed: I have personally reviewed following labs and imaging studies  CBC: Recent Labs  Lab 12/19/18 0341 12/19/18 1945 12/20/18 0400 12/20/18 1401 12/21/18 0535  WBC 9.4  --  11.5*  --  11.3*  NEUTROABS 6.2  --   --   --   --   HGB 10.1* 9.1* 9.9* 9.6* 9.4*  HCT 33.7* 29.6* 31.4* 30.5* 29.8*  MCV 112.3*  --  109.4*  --  104.9*  PLT 298  --  263  --  962   Basic Metabolic Panel: Recent Labs  Lab 12/19/18 0341 12/20/18 0400 12/21/18 0535  NA 142 139 140  K 4.5 4.4 4.1  CL 110 107 104  CO2 24 24 28   GLUCOSE 84 90 92  BUN 22 17 15   CREATININE 0.80 0.68 0.87  CALCIUM 8.7* 8.2* 8.2*   GFR: Estimated Creatinine Clearance: 54 mL/min (by C-G formula based on SCr of 0.87 mg/dL). Liver Function Tests: No results for input(s): AST, ALT, ALKPHOS, BILITOT, PROT, ALBUMIN in the last 168 hours. No results for input(s): LIPASE, AMYLASE in the last 168 hours. No results for input(s): AMMONIA in the last 168 hours. Coagulation Profile: No results for input(s): INR, PROTIME in the last 168 hours. Cardiac Enzymes: No results for input(s): CKTOTAL, CKMB, CKMBINDEX, TROPONINI in the last 168 hours. BNP (last 3 results) No results for input(s): PROBNP in the last 8760 hours. HbA1C: No results for input(s): HGBA1C in the last 72 hours. CBG: No results for input(s): GLUCAP in the last 168 hours. Lipid Profile: No results for input(s): CHOL, HDL, LDLCALC, TRIG, CHOLHDL, LDLDIRECT in the last 72 hours. Thyroid Function Tests: No results for input(s): TSH, T4TOTAL, FREET4, T3FREE, THYROIDAB in the last 72 hours. Anemia Panel: No results for input(s): VITAMINB12, FOLATE, FERRITIN,  TIBC, IRON, RETICCTPCT in the last 72 hours. Urine analysis:    Component Value Date/Time   COLORURINE YELLOW 12/20/2018 1603   APPEARANCEUR CLEAR 12/20/2018 1603   LABSPEC 1.014 12/20/2018 1603   PHURINE 6.0 12/20/2018 1603   GLUCOSEU NEGATIVE 12/20/2018 1603   HGBUR NEGATIVE 12/20/2018 1603   BILIRUBINUR NEGATIVE 12/20/2018 1603  KETONESUR NEGATIVE 12/20/2018 1603   PROTEINUR NEGATIVE 12/20/2018 1603   NITRITE NEGATIVE 12/20/2018 1603   LEUKOCYTESUR NEGATIVE 12/20/2018 1603   Sepsis Labs: @LABRCNTIP (procalcitonin:4,lacticidven:4) ) Recent Results (from the past 240 hour(s))  SARS Coronavirus 2 (CEPHEID - Performed in [facility] hospital lab), Hosp Order     Status: None   Collection Time: 12/19/18  3:41 AM  Result Value Ref Range Status   SARS Coronavirus 2 NEGATIVE NEGATIVE Final    Comment: (NOTE) If result is NEGATIVE SARS-CoV-2 target nucleic acids are NOT DETECTED. The SARS-CoV-2 RNA is generally detectable in upper and lower  respiratory specimens during the acute phase of infection. The lowest  concentration of SARS-CoV-2 viral copies this assay can detect is 250  copies / mL. A negative result does not preclude SARS-CoV-2 infection  and should not be used as the sole basis for treatment or other  patient management decisions.  A negative result may occur with  improper specimen collection / handling, submission of specimen other  than nasopharyngeal swab, presence of viral mutation(s) within the  areas targeted by this assay, and inadequate number of viral copies  (<250 copies / mL). A negative result must be combined with clinical  observations, patient history, and epidemiological information. If result is POSITIVE SARS-CoV-2 target nucleic acids are DETECTED. The SARS-CoV-2 RNA is generally detectable in upper and lower  respiratory specimens dur ing the acute phase of infection.  Positive  results are indicative of active infection with SARS-CoV-2.  Clinical   correlation with patient history and other diagnostic information is  necessary to determine patient infection status.  Positive results do  not rule out bacterial infection or co-infection with other viruses. If result is PRESUMPTIVE POSTIVE SARS-CoV-2 nucleic acids MAY BE PRESENT.   A presumptive positive result was obtained on the submitted specimen  and confirmed on repeat testing.  While 2019 novel coronavirus  (SARS-CoV-2) nucleic acids may be present in the submitted sample  additional confirmatory testing may be necessary for epidemiological  and / or clinical management purposes  to differentiate between  SARS-CoV-2 and other Sarbecovirus currently known to infect humans.  If clinically indicated additional testing with an alternate test  methodology [phone]) is advised. The SARS-CoV-2 RNA is generally  detectable in upper and lower respiratory sp ecimens during the acute  phase of infection. The expected result is Negative. Fact Sheet for Patients:  StrictlyIdeas.no Fact Sheet for Healthcare Providers: BankingDealers.co.za This test is not yet approved or cleared by the [place] FDA and has been authorized for detection and/or diagnosis of SARS-CoV-2 by FDA under an Emergency Use Authorization (EUA).  This EUA will remain in effect (meaning this test can be used) for the duration of the COVID-19 declaration under Section 564(b)(1) of the Act, 21 U.S.C. section 360bbb-3(b)(1), unless the authorization is terminated or revoked sooner. Performed at [facility], [place] [address], [place], [place] [address]   Culture, blood (Routine X 2) w Reflex to ID Panel     Status: None (Preliminary result)   Collection Time: 12/20/18  4:13 PM  Result Value Ref Range Status   Specimen Description   Final    BLOOD RIGHT HAND Performed at [facility] [address] [place], [place] [address]     Special Requests   Final    BOTTLES DRAWN AEROBIC AND ANAEROBIC Blood Culture adequate volume Performed at [facility] [address], [place], [place] [address]    Culture  Final    NO GROWTH < 12 HOURS Performed at [facility] [address], [place], [place] [address]    Report Status PENDING  Incomplete  Culture, blood (Routine X 2) w Reflex to ID Panel     Status: None (Preliminary result)   Collection Time: 12/20/18  4:19 PM  Result Value Ref Range Status   Specimen Description   Final    BLOOD RIGHT HAND Performed at [facility] [address], [place], [place] [address]    Special Requests   Final    BOTTLES DRAWN AEROBIC ONLY Blood Culture results may not be optimal due to an inadequate volume of blood received in culture bottles Performed at [facility] [address], [place], [place] [address]    Culture   Final    NO GROWTH < 12 HOURS Performed at [facility] [address] [place], [place] [address]    Report Status PENDING  Incomplete         Radiology Studies: Vas [place] Lower Extremity Venous (dvt)  Result Date: 12/20/2018  Lower Venous Study Risk Factors: Confirmed PE. Limitations: Patient in pain/ low tolerance for compressions. Comparison Study: 12/10/18 negative Performing Technologist: June Leap RDMS, RVT  Examination Guidelines: A complete evaluation includes B-mode imaging, spectral Doppler, color Doppler, and power Doppler as needed of all accessible portions of each vessel. Bilateral testing is considered an integral part of a complete examination. Limited examinations for reoccurring indications may be performed as noted.  +---------+---------------+---------+-----------+----------+--------------+ RIGHT    CompressibilityPhasicitySpontaneityPropertiesSummary        +---------+---------------+---------+-----------+----------+--------------+ CFV      Full           Yes       Yes                                 +---------+---------------+---------+-----------+----------+--------------+ SFJ      Full                                                        +---------+---------------+---------+-----------+----------+--------------+ FV Prox  Full                                                        +---------+---------------+---------+-----------+----------+--------------+ FV Mid   Full                                                        +---------+---------------+---------+-----------+----------+--------------+ FV DistalFull                                                        +---------+---------------+---------+-----------+----------+--------------+ PFV      Full                                                        +---------+---------------+---------+-----------+----------+--------------+  POP                     Yes      Yes                                 +---------+---------------+---------+-----------+----------+--------------+ PTV                                                   Not visualized +---------+---------------+---------+-----------+----------+--------------+ PERO                                                  Not visualized +---------+---------------+---------+-----------+----------+--------------+ Due to patient's very tender legs, unable to adequately image calf veins.  +---------+---------------+---------+-----------+----------+--------------+ LEFT     CompressibilityPhasicitySpontaneityPropertiesSummary        +---------+---------------+---------+-----------+----------+--------------+ CFV      Full           Yes      Yes                                 +---------+---------------+---------+-----------+----------+--------------+ SFJ      Full                                                        +---------+---------------+---------+-----------+----------+--------------+ FV Prox   Full                                                        +---------+---------------+---------+-----------+----------+--------------+ FV Mid   Full                                                        +---------+---------------+---------+-----------+----------+--------------+ FV DistalFull                                                        +---------+---------------+---------+-----------+----------+--------------+ PFV      Full                                                        +---------+---------------+---------+-----------+----------+--------------+ POP                     Yes      Yes                                 +---------+---------------+---------+-----------+----------+--------------+  PTV                                                   Not visualized +---------+---------------+---------+-----------+----------+--------------+ PERO                                                  Not visualized +---------+---------------+---------+-----------+----------+--------------+ Due to patient's very tender legs, unable to adequately image calf veins.    Summary: Right: There is no evidence of deep vein thrombosis in the lower extremity. However, portions of this examination were limited- see technologist comments above. No cystic structure found in the popliteal fossa. Left: There is no evidence of deep vein thrombosis in the lower extremity. However, portions of this examination were limited- see technologist comments above. A cystic structure is found in the popliteal fossa.  *See table(s) above for measurements and observations.    Preliminary       Scheduled Meds: . amiodarone  200 mg Oral Daily  . aspirin EC  81 mg Oral Daily  . carvedilol  12.5 mg Oral BID WC  . [START ON 12/22/2018] clopidogrel  75 mg Oral Q breakfast  . docusate sodium  100 mg Oral BID  . ferrous sulfate  325 mg Oral TID WC  . furosemide  20 mg Oral Daily  . latanoprost   1 drop Both Eyes QHS  . nystatin   Topical BID  . predniSONE  15 mg Oral Q breakfast  . rosuvastatin  10 mg Oral q1800  . sodium chloride flush  3 mL Intravenous Q12H   Continuous Infusions: . sodium chloride    . sodium chloride    . sodium chloride    . heparin Stopped (12/21/18 0513)     LOS: 1 day      Debbe Odea, MD [facility] Pager: [email or web address] Password [facility] 12/21/2018, 11:37 AM

## 2018-12-21 NOTE — Progress Notes (Addendum)
PT Cancellation Note  Patient Details Name: Kelsey [name] MRN: [id number] DOB: [birth year]   Cancelled Treatment:    Reason Eval/Treat Not Completed: Medical issues which prohibited therapy.  Pt was demonstrating low O2 sats and declined to move her today.  Ranged from 66% to 85%.   Will re-attempt tomorrow.   Ramond Dial 12/21/2018, 2:21 PM   Mee Hives, PT MS Acute Rehab Dept. Number: [place] [facility] [place]

## 2018-12-21 NOTE — Interval H&P Note (Signed)
History and Physical Interval Note:  12/21/2018 7:44 AM  Kelsey [name]  has presented today for surgery, with the diagnosis of Peripheral Vascular Disease.  The various methods of treatment have been discussed with the patient and family. After consideration of risks, benefits and other options for treatment, the patient has consented to  Procedure(s): IVC FILTER INSERTION (N/A) ABDOMINAL AORTOGRAM W/ BilateralLOWER EXTREMITY Runoff (N/A) as a surgical intervention.  The patient's history has been reviewed, patient examined, no change in status, stable for surgery.  I have reviewed the patient's chart and labs.  Questions were answered to the patient's satisfaction.     Annamarie Major

## 2018-12-22 ENCOUNTER — Inpatient Hospital Stay (HOSPITAL_COMMUNITY): Payer: Medicare HMO

## 2018-12-22 DIAGNOSIS — I4819 Other persistent atrial fibrillation: Secondary | ICD-10-CM

## 2018-12-22 LAB — CBC
HCT: 23.7 % — ABNORMAL LOW (ref 36.0–46.0)
HCT: 22.8 % — ABNORMAL LOW (ref 36.0–46.0)
HCT: 25 % — ABNORMAL LOW (ref 36.0–46.0)
Hemoglobin: 7.7 g/dL — ABNORMAL LOW (ref 12.0–15.0)
Hemoglobin: 7.2 g/dL — ABNORMAL LOW (ref 12.0–15.0)
Hemoglobin: 8.1 g/dL — ABNORMAL LOW (ref 12.0–15.0)
MCH: 33.8 pg (ref 26.0–34.0)
MCH: 33 pg (ref 26.0–34.0)
MCH: 32.5 pg (ref 26.0–34.0)
MCHC: 32.5 g/dL (ref 30.0–36.0)
MCHC: 31.6 g/dL (ref 30.0–36.0)
MCHC: 32.4 g/dL (ref 30.0–36.0)
MCV: 103.9 fL — ABNORMAL HIGH (ref 80.0–100.0)
MCV: 104.6 fL — ABNORMAL HIGH (ref 80.0–100.0)
MCV: 100.4 fL — ABNORMAL HIGH (ref 80.0–100.0)
Platelets: 153 10*3/uL (ref 150–400)
Platelets: 132 10*3/uL — ABNORMAL LOW (ref 150–400)
Platelets: 118 10*3/uL — ABNORMAL LOW (ref 150–400)
RBC: 2.28 MIL/uL — ABNORMAL LOW (ref 3.87–5.11)
RBC: 2.18 MIL/uL — ABNORMAL LOW (ref 3.87–5.11)
RBC: 2.49 MIL/uL — ABNORMAL LOW (ref 3.87–5.11)
RDW: 16.4 % — ABNORMAL HIGH (ref 11.5–15.5)
RDW: 16.3 % — ABNORMAL HIGH (ref 11.5–15.5)
RDW: 17.6 % — ABNORMAL HIGH (ref 11.5–15.5)
WBC: 3.5 10*3/uL — ABNORMAL LOW (ref 4.0–10.5)
WBC: 2.4 10*3/uL — ABNORMAL LOW (ref 4.0–10.5)
WBC: 3.3 10*3/uL — ABNORMAL LOW (ref 4.0–10.5)
nRBC: 0 % (ref 0.0–0.2)
nRBC: 0 % (ref 0.0–0.2)
nRBC: 0 % (ref 0.0–0.2)

## 2018-12-22 LAB — PROTIME-INR
INR: 1.7 — ABNORMAL HIGH (ref 0.8–1.2)
Prothrombin Time: 19.8 seconds — ABNORMAL HIGH (ref 11.4–15.2)

## 2018-12-22 LAB — BASIC METABOLIC PANEL
Anion gap: 11 (ref 5–15)
BUN: 16 mg/dL (ref 8–23)
CO2: 23 mmol/L (ref 22–32)
Calcium: 7.5 mg/dL — ABNORMAL LOW (ref 8.9–10.3)
Chloride: 105 mmol/L (ref 98–111)
Creatinine, Ser: 0.91 mg/dL (ref 0.44–1.00)
GFR calc Af Amer: >60 mL/min (ref >60)
GFR calc non Af Amer: 58 mL/min — ABNORMAL LOW (ref >60)
Glucose, Bld: 78 mg/dL (ref 70–99)
Potassium: 3.7 mmol/L (ref 3.5–5.1)
Sodium: 139 mmol/L (ref 135–145)

## 2018-12-22 LAB — LACTIC ACID, PLASMA
Lactic Acid, Venous: 2.4 mmol/L (ref 0.5–1.9)
Lactic Acid, Venous: 2.9 mmol/L (ref 0.5–1.9)

## 2018-12-22 LAB — APTT: aPTT: 42 seconds — ABNORMAL HIGH (ref 24–36)

## 2018-12-22 LAB — PROCALCITONIN: Procalcitonin: 36.76 ng/mL

## 2018-12-22 LAB — GLUCOSE, CAPILLARY: Glucose-Capillary: 101 mg/dL — ABNORMAL HIGH (ref 70–99)

## 2018-12-22 LAB — PREPARE RBC (CROSSMATCH)

## 2018-12-22 LAB — BLOOD PRODUCT ORDER (VERBAL) VERIFICATION

## 2018-12-22 LAB — HEPARIN LEVEL (UNFRACTIONATED): Heparin Unfractionated: 0.24 IU/mL — ABNORMAL LOW (ref 0.30–0.70)

## 2018-12-22 LAB — ABO/RH: ABO/RH(D): O NEG

## 2018-12-22 MED ORDER — HYDROCORTISONE NA SUCCINATE PF 100 MG IJ SOLR
100.0000 mg | Freq: Three times a day (TID) | INTRAMUSCULAR | Status: DC
  Administered 2018-12-22 – 2018-12-25 (×9): 100 mg via INTRAVENOUS
  Filled 2018-12-22 (×9): qty 2

## 2018-12-22 MED ORDER — ENSURE ENLIVE PO LIQD
237.0000 mL | Freq: Two times a day (BID) | ORAL | Status: DC
  Administered 2018-12-23 – 2018-12-25 (×6): 237 mL via ORAL

## 2018-12-22 MED ORDER — SODIUM CHLORIDE 0.9% IV SOLUTION: Freq: Once | INTRAVENOUS | Status: DC

## 2018-12-22 MED ORDER — SODIUM CHLORIDE 0.9 % IV SOLN
2.0000 g | Freq: Once | INTRAVENOUS | Status: AC
  Administered 2018-12-22: 2 g via INTRAVENOUS
  Filled 2018-12-22: qty 2

## 2018-12-22 MED ORDER — VANCOMYCIN HCL 10 G IV SOLR
1500.0000 mg | INTRAVENOUS | Status: DC
  Administered 2018-12-22: 1500 mg via INTRAVENOUS
  Filled 2018-12-22 (×2): qty 1500

## 2018-12-22 MED ORDER — VANCOMYCIN HCL 10 G IV SOLR
1250.0000 mg | Freq: Once | INTRAVENOUS | Status: DC
  Filled 2018-12-22: qty 1250

## 2018-12-22 MED ORDER — SODIUM CHLORIDE 0.9 % IV SOLN
2.0000 g | Freq: Two times a day (BID) | INTRAVENOUS | Status: DC
  Administered 2018-12-23 (×2): 2 g via INTRAVENOUS
  Filled 2018-12-22 (×3): qty 2

## 2018-12-22 MED ORDER — SODIUM CHLORIDE 0.9 % IV BOLUS
1000.0000 mL | Freq: Once | INTRAVENOUS | Status: AC
  Administered 2018-12-22: 1000 mL via INTRAVENOUS

## 2018-12-22 NOTE — Progress Notes (Signed)
Pharmacy Antibiotic Note  Kelsey [name] is a 85 y.o. female admitted on 12/19/2018 found to have pulmonary infarct/resp failure. Remains febrile. Planning to initiate broad spectrum antibiotics with undetermined source at this time.   Vancomycin 1500 mg IV Q 24 hrs. Goal AUC 400-550. Expected AUC: 478 SCr used: 0.9  Plan: -Vancomycin 1500 mg IV q24h -Cefepime 2g/12h -Monitor renal fx, cultures, vancomycin levels as needed   Height: 5\' 8"  (172.7 cm) Weight: 179 lb 14.3 oz (81.6 kg) IBW/kg (Calculated) : 63.9  Temp (24hrs), Avg:101.1 F (38.4 C), Min:98.2 F (36.8 C), Max:103.6 F (39.8 C)  Recent Labs  Lab 12/19/18 0341 12/20/18 0400 12/21/18 0535 12/22/18 0306  WBC 9.4 11.5* 11.3* 3.5*  CREATININE 0.80 0.68 0.87 0.91     Antimicrobials this admission: 6/3 vancomycin > 6/3 cefepime >  Dose adjustments this admission: N/A  Microbiology results: 6/3 blood cx: ip 6/3 Covid-19: neg   MastersJake Church 12/22/2018 2:23 PM

## 2018-12-22 NOTE — Progress Notes (Signed)
[place] for IV heparin Indication: pulmonary embolus  Allergies  Allergen Reactions  . Cardizem [Diltiazem Hcl] Swelling  . Clonidine Derivatives Swelling  . Doxazosin Swelling    Patient Measurements: Height: 5\' 8"  (172.7 cm) Weight: 179 lb 14.3 oz (81.6 kg) IBW/kg (Calculated) : 63.9 Heparin Dosing Weight: 76  Vital Signs: Temp: 98.2 F (36.8 C) (06/03 0800) Temp Source: Oral (06/03 0800) BP: 106/56 (06/03 0800) Pulse Rate: 88 (06/03 0800)  Labs: Recent Labs    12/20/18 0400 12/20/18 1401 12/21/18 0535 12/22/18 0306  HGB 9.9* 9.6* 9.4* 7.7*  HCT 31.4* 30.5* 29.8* 23.7*  PLT 263  --  201 153  HEPARINUNFRC 0.28* 0.58 0.57 0.24*  CREATININE 0.68  --  0.87 0.91    Estimated Creatinine Clearance: 51.6 mL/min (by C-G formula based on SCr of 0.91 mg/dL).  Assessment: 85 yo female with new PE to start IV heparin per Rx dosing. Baseline CBC and SCr done. No anticoagulant prescribed PTA. Discharge note on 12/10/18 indicated that anticoag. was held d/t severe calf hematoma  12/22/2018  S/p IVC filter 6/2, placed IVC since her large bleed (large left calf hematoma) contraindicates anticoagulation, no plan for oral AC at discharge. Today 6/3 the heparin level is 0.24 on heparin infusion 1150 units/hr.  RN reports heparin drip was infusing yesterday when patient returned to floor s/p IVC filter placement.   Continuing IV heparin for now per Dr. Reggy Eye note post IVC filter 6/2.  Hgb has dropped to 7.7 from 9.4, PLTC down to 153k from 201k.   RN reports that Dr. Oneida Alar seeing patient now and said to discontinue the IV heparin drip.    Goal of Therapy:  Heparin level 0.3-0.7 units/ml Monitor platelets by anticoagulation protocol: Yes   Plan:  Heparin drip discontinued per Dr. Jon Gills, [place] Pharmacist Pager: [phone] Please check AMION for all [facility] phone numbers After 10:00 PM, call [place]  [phone] 12/22/2018 8:22 AM

## 2018-12-22 NOTE — Progress Notes (Addendum)
  Progress Note    12/22/2018 8:04 AM 1 Day Post-Op  Subjective:  Some soreness L groin   Vitals:   12/22/18 0130 12/22/18 0434  BP: (!) 93/43 (!) 101/57  Pulse: (!) 112 (!) 128  Resp: (!) 27 (!) 30  Temp:  100.2 F (37.9 C)  SpO2: (!) 87% 100%   Physical Exam: Lungs:  Non labored Incisions:  L groin cath site with local ecchymosis, no palpable firm hematoma; R groin cath site unremarkable Extremities:  Feet warm to touch with good cap refill; unna boots left in place Neurologic: A&O  CBC    Component Value Date/Time   WBC 3.5 (L) 12/22/2018 0306   RBC 2.28 (L) 12/22/2018 0306   HGB 7.7 (L) 12/22/2018 0306   HCT 23.7 (L) 12/22/2018 0306   HCT 39.2 12/04/2018 0844   PLT 153 12/22/2018 0306   MCV 103.9 (H) 12/22/2018 0306   MCH 33.8 12/22/2018 0306   MCHC 32.5 12/22/2018 0306   RDW 16.4 (H) 12/22/2018 0306   LYMPHSABS 2.1 12/19/2018 0341   MONOABS 0.6 12/19/2018 0341   EOSABS 0.1 12/19/2018 0341   BASOSABS 0.1 12/19/2018 0341    BMET    Component Value Date/Time   NA 139 12/22/2018 0306   K 3.7 12/22/2018 0306   CL 105 12/22/2018 0306   CO2 23 12/22/2018 0306   GLUCOSE 78 12/22/2018 0306   BUN 16 12/22/2018 0306   CREATININE 0.91 12/22/2018 0306   CALCIUM 7.5 (L) 12/22/2018 0306   GFRNONAA 58 (L) 12/22/2018 0306   GFRAA >60 12/22/2018 0306    INR    Component Value Date/Time   INR 1.04 08/13/2017 0822     Intake/Output Summary (Last 24 hours) at 12/22/2018 0804 Last data filed at 12/22/2018 1884 Gross per 24 hour  Intake 892.76 ml  Output 225 ml  Net 667.76 ml     Assessment/Plan:  85 y.o. female is s/p R SFA stent and placement of IVC filter 1 Day Post-Op   Feet symmetrically warm to touch with good capillary refill Continue unna boots bilaterally She will need asa and plavix for R SFA stent patency We will arrange follow up for wound care as an outpatient [facility] for discharge when mobility increased and pain controlled   Dagoberto Ligas, PA-C  [facility] [phone] 12/22/2018 8:04 AM  Agree with above.  Pt needs weekly unna boot changes.  I will arrange follow up to check wounds in 1 week. Would d/c heparin so she does not have further bleeding into calf Will need plavix asa for at least 6 weeks.  Hopefully she can tolerate this.  Call if questions  Ruta Hinds, MD [facility] [place] Office: [phone] Pager: [phone]

## 2018-12-22 NOTE — Progress Notes (Signed)
New hematoma noted on assessment on Left lower quadrant abdomen. Matt PA and Patal MD notified. Orders placed. Will continue to monitor

## 2018-12-22 NOTE — Significant Event (Addendum)
Rapid Response Event Note  Overview: Hypotension   Initial Focused Assessment: Called by nurse about patient's SBP being in the 60s. When I arrived, nurse informed that patient has more bruising on her LT lower abdomen coupled with increased pain/tenderness. Patient was somnolent, would moan/groan to painful stimuli but otherwise was quite sleepy. HR in the 100-110 range, 99% on RA, RR 20-24. Has been febrile on and off since yesterday. Manual BP was checked and it correlated with the automatic BPs, MD arrived at the bedside as well, PCCM was consulted. STAT labs were being obtained when I came to the bedside. BLE were bound, dusky in the toes, + popliteal pulses per nurse (not a few finding). Lung sounds clear in the bilateral upper fields, diminished in the lower fields.  Interventions: -- NS 250 cc initiated - MD ordered a total of 1L NS so another 750 cc were given -- Emergency Release Blood Transfusion was given - 1 unit was given rapidly over 30 minutes  -- Solu - Cortef 100 mg Q8H -- ABX   Plan of Care: -- BP improved into the SBP 70s - patient was more awake, conversant, denies pain, states she feels better.  -- MD to order a 2nd unit of PRBCs -- Patient's spouse was called to the hospital to have [place] discussion, patient is a DNR  Event Summary:  Call Time [place] End Time 1535   Maday Guarino R

## 2018-12-22 NOTE — Progress Notes (Signed)
PT Cancellation Note  Patient Details Name: Kelsey [name] MRN: [id number] DOB: 1931-08-11   Cancelled Treatment:    Reason Eval/Treat Not Completed: Medical issues which prohibited therapy Per RN, rapid response currently seeing pt. Will follow up once pt medically appropriate and as schedule allows.   Leighton Ruff, PT, DPT  Acute [facility] Services  Pager: [phone] Office: [phone]  Rudean Hitt 12/22/2018, 2:21 PM

## 2018-12-22 NOTE — Consult Note (Addendum)
Cardiology Consultation:   Patient ID: Kelsey [name] MRN: [id number]; DOB: 1931-08-18  Admit date: 12/19/2018 Date of Consult: 12/22/2018  Primary Care Provider: [name], MD Primary Cardiologist: Mahala Menghini, MD    Patient Profile:   Kelsey [name] is a 85 y.o. female with a hx of PMH of HTN, HLD, pulmonary HTN, atrial fibrillation not on anticoagulation, vocal cord paralysis, chronic combined CHF and chronic prednisone use for unknown reason and recent long admission as below who is being seen today for the evaluation of atrial fibrillation at the request of Dr. Posey Pronto.   Patient follows with Dr. [name] at [facility] for cardiology care. She has a history of atrial fibrillation with past failed cardioversion and amiodarone was discontinued due to tremor and lack of HR response.  Self discontinued Xarelto.  Her rates have been managed with BB lockers.   She was admitted 11/2018 with septic shock.  Treated with broad-spectrum antibiotic.  Cardiology for persistently elevated atrial fibrillation.  Treated on amiodarone gtt. and transition to p.o. with plan to discontinue as outpatient by primary cardiologist. Echo showed new cardiomyopathy with LVEF 25-30%, global hypokinesis and at least moderate RV systolic dysfunction as well as moderate to severe biatrial enlargement.  She was placed on Coreg. CT abdomen showed right lower lobe infarct with pulmonary embolism.  Felt not to be a candidate for anticoagulation given hematoma of  calf.  History of Present Illness:   [name] [birth year] with worsening shortness of breath and bleeding from right leg wound.  She was found hypoxic at 85% on room air.  Patient was admitted for acute respiratory failure secondary to pulmonary emboli with pulmonary infract.  Patient underwent placement of IVC filter and placement of right superficial femoral artery yesterday by vascular surgery.  He was placed on  aspirin and Plavix but stopped after today's dose. Off heparin. On broad spectrum antibiotics. New abdominal hematoma. This afternoon patient was hypotensive with SBP in 60s. HR of 100s. givnen NS 250cc with improved blood pressure. Patient denies any chest pain or palpitation. Unsure about afternoon event.   Hgb of 9.4 yesterday to 7.7>>7.2 today. Plan to transfuse 2 unit of PRBCs. Lactic acid 2.4. CXR this afternoon showed atelectasis vs pneumonia. Pending CT of abdomen.   Past Medical History:  Diagnosis Date   Arthritis    DJD   Cancer ([place])    basal cell CA   Chronic anticoagulation    on Xarelto   Chronic atrial fibrillation    s/p failed DCCV now on rate control   Diastolic dysfunction    by echo 07/2012   Glaucoma    Hyperlipidemia    Hypertension    Pulmonary HTN ([facility])    mild - RVSP 40-13mmHg   Vocal cord paralysis     Past Surgical History:  Procedure Laterality Date   ABDOMINAL AORTOGRAM W/LOWER EXTREMITY N/A 12/21/2018   Procedure: ABDOMINAL AORTOGRAM W/ BilateralLOWER EXTREMITY [place];  Surgeon: Serafina Mitchell, MD;  Location: [facility];  Service: Cardiovascular;  Laterality: N/A;   BACK SURGERY     C6-C7 discectomy   basal cell CA removal     BREAST SURGERY     breast implants   CARDIOVERSION N/A 09/06/2012   Procedure: CARDIOVERSION;  Surgeon: [name], MD;  Location: [facility];  Service: Cardiovascular;  Laterality: N/A;   EYE SURGERY  2011   bilateral cataract surgery   FACIAL COSMETIC SURGERY     IVC  FILTER INSERTION N/A 12/21/2018   Procedure: IVC FILTER INSERTION;  Surgeon: Serafina Mitchell, MD;  Location: [facility];  Service: Cardiovascular;  Laterality: N/A;   JOINT REPLACEMENT  2003   bilateral hip replacements   MUSCLE BIOPSY N/A 08/13/2017   Procedure: LEFT QUADRICEPS MUSCLE BIOPSY;  Surgeon: [name], MD;  Location: [facility];  Service: General;  Laterality: N/A;   PERIPHERAL VASCULAR INTERVENTION  Right 12/21/2018   Procedure: PERIPHERAL VASCULAR INTERVENTION;  Surgeon: Serafina Mitchell, MD;  Location: [facility];  Service: Cardiovascular;  Laterality: Right;     Inpatient Medications: Scheduled Meds:  sodium chloride   Intravenous Once   amiodarone  200 mg Oral Daily   docusate sodium  100 mg Oral BID   feeding supplement (ENSURE ENLIVE)  237 mL Oral BID BM   hydrocortisone sod succinate (SOLU-CORTEF) inj  100 mg Intravenous Q8H   latanoprost  1 drop Both Eyes QHS   nystatin   Topical BID   sodium chloride flush  3 mL Intravenous Q12H   Continuous Infusions:  sodium chloride 100 mL/hr at 12/22/18 0613   sodium chloride     [START ON 12/23/2018] ceFEPime (MAXIPIME) IV     vancomycin     PRN Meds: sodium chloride, acetaminophen **OR** acetaminophen, HYDROcodone-acetaminophen, ondansetron (ZOFRAN) IV, ondansetron **OR** [DISCONTINUED] ondansetron (ZOFRAN) IV, oxyCODONE, polyethylene glycol, sodium chloride flush  Allergies:    Allergies  Allergen Reactions   Cardizem [Diltiazem Hcl] Swelling   Clonidine Derivatives Swelling   Doxazosin Swelling    Social History:   Social History   Socioeconomic History   Marital status: Married    Spouse name: Not on file   Number of children: Not on file   Years of education: Not on file   Highest education level: Not on file  Occupational History   Not on file  Social Needs   Financial resource strain: Not on file   Food insecurity:    Worry: Not on file    Inability: Not on file   Transportation needs:    Medical: Not on file    Non-medical: Not on file  Tobacco Use   Smoking status: Former Smoker   Smokeless tobacco: Never Used  Substance and Sexual Activity   Alcohol use: Yes    Comment: occasional wine   Drug use: No   Sexual activity: Not on file  Lifestyle   Physical activity:    Days per week: Not on file    Minutes per session: Not on file   Stress: Not on file    Relationships   Social connections:    Talks on phone: Not on file    Gets together: Not on file    Attends religious service: Not on file    Active member of club or organization: Not on file    Attends meetings of clubs or organizations: Not on file    Relationship status: Not on file   Intimate partner violence:    Fear of current or ex partner: Not on file    Emotionally abused: Not on file    Physically abused: Not on file    Forced sexual activity: Not on file  Other Topics Concern   Not on file  Social History Narrative   Not on file    Family History:   Family History  Problem Relation Age of Onset   Alzheimer's disease Mother      ROS:  Please see the history of present  illness.  All other ROS reviewed and negative.     Physical Exam/Data:   Vitals:   12/22/18 1500 12/22/18 1515 12/22/18 1530 12/22/18 1535  BP: (!) 75/53 (!) 72/52 (!) 71/45 (!) 82/55  Pulse: (!) 104 (!) 112 (!) 105   Resp: (!) 22 (!) 21 18   Temp:      TempSrc:      SpO2: 100% 100% 100%   Weight:      Height:        Intake/Output Summary (Last 24 hours) at 12/22/2018 1611 Last data filed at 12/22/2018 0900 Gross per 24 hour  Intake 892.76 ml  Output 225 ml  Net 667.76 ml   Last 3 Weights 12/21/2018 12/20/2018 12/19/2018  Weight (lbs) 179 lb 14.3 oz 174 lb 6.1 oz 178 lb 2.1 oz  Weight (kg) 81.6 kg 79.1 kg 80.8 kg     Body mass index is 27.35 kg/m.  General: Ill-appearing female in no acute distress HEENT: normal Lymph: no adenopathy Neck: no JVD Endocrine:  No thryomegaly Vascular: No carotid bruits; FA pulses 2+ bilaterally without bruits  Cardiac:  normal S1, S2; irregularly irregular; no murmur  Lungs:  clear to auscultation bilaterally, no wheezing, rhonchi or rales  Abd: Left abdomen tender with significant ecchymosis Ext: Left calf with swelling and significant tenderness.  Bilateral Ace wraps on legs Musculoskeletal:  No deformities,  Skin: warm and dry  Neuro:  CNs 2-12  intact, no focal abnormalities noted Psych:  Normal affect   EKG:  The EKG 12/19/2018 was personally reviewed and demonstrates:  afib at rate of 105 bpm Telemetry:  Telemetry was personally reviewed and demonstrates: Atrial fibrillation at rate of 100-110s  Relevant CV Studies:  Echo 12/02/2018 IMPRESSIONS    1. The left ventricle has severely reduced systolic function, with an ejection fraction of 25-30%. The cavity size was normal. There is mildly increased left ventricular wall thickness. Left ventricular diastolic function could not be evaluated  secondary to atrial fibrillation. Left ventricular diffuse hypokinesis.  2. The right ventricle has moderately reduced systolic function. The cavity was mildly enlarged. There is no increase in right ventricular wall thickness.  3. Left atrial size was severely dilated.  4. Right atrial size was moderately dilated.  5. The mitral valve is abnormal. Mild thickening of the mitral valve leaflet. There is mild to moderate mitral annular calcification present.  6. The tricuspid valve was abnormal. Tricuspid valve regurgitation is mild-moderate.  7. The aortic valve is abnormal Mild calcification of the aortic valve. No stenosis of the aortic valve.  8. The aortic root and ascending aorta are normal in size and structure.  9. The interatrial septum was not well visualized.  Laboratory Data:  Chemistry Recent Labs  Lab 12/20/18 0400 12/21/18 0535 12/22/18 0306  NA 139 140 139  K 4.4 4.1 3.7  CL 107 104 105  CO2 24 28 23   GLUCOSE 90 92 78  BUN 17 15 16   CREATININE 0.68 0.87 0.91  CALCIUM 8.2* 8.2* 7.5*  GFRNONAA >60 >60 58*  GFRAA >60 >60 >60  ANIONGAP 8 8 11     No results for input(s): PROT, ALBUMIN, AST, ALT, ALKPHOS, BILITOT in the last 168 hours. Hematology Recent Labs  Lab 12/21/18 0535 12/22/18 0306 12/22/18 1346  WBC 11.3* 3.5* 2.4*  RBC 2.84* 2.28* 2.18*  HGB 9.4* 7.7* 7.2*  HCT 29.8* 23.7* 22.8*  MCV 104.9* 103.9*  104.6*  MCH 33.1 33.8 33.0  MCHC 31.5 32.5 31.6  RDW  16.6* 16.4* 16.3*  PLT 201 153 132*   Cardiac EnzymesNo results for input(s): TROPONINI in the last 168 hours. No results for input(s): TROPIPOC in the last 168 hours.  BNP Recent Labs  Lab 12/19/18 0341  BNP 486.5*    DDimer No results for input(s): DDIMER in the last 168 hours.  Radiology/Studies:  Dg Chest 2 View  Result Date: 12/19/2018 CLINICAL DATA:  Shortness of breath. Hypoxia. EXAM: CHEST - 2 VIEW COMPARISON:  12/01/2018 FINDINGS: Stable mild cardiomegaly. Aortic atherosclerosis. No evidence of acute infiltrate or pleural effusion. Pulmonary hyperinflation again seen, suspicious for COPD. Bilateral breast implants noted with capsular calcification. IMPRESSION: Stable mild cardiomegaly and probable COPD. No active lung disease. Electronically Signed   By: Earle Gell M.D.   On: 12/19/2018 01:52   Ct Angio Chest Pe W And/or Wo Contrast  Result Date: 12/19/2018 CLINICAL DATA:  Hypoxia EXAM: CT ANGIOGRAPHY CHEST WITH CONTRAST TECHNIQUE: Multidetector CT imaging of the chest was performed using the standard protocol during bolus administration of intravenous contrast. Multiplanar CT image reconstructions and MIPs were obtained to evaluate the vascular anatomy. CONTRAST:  163mL OMNIPAQUE IOHEXOL 350 MG/ML SOLN COMPARISON:  Chest radiographs dated 12/19/2018 FINDINGS: Cardiovascular: Satisfactory opacification of the bilateral pulmonary arteries to the segmental level. Segmental/subsegmental right lower lobe pulmonary emboli (series 5/images 137, 147, and 160). Overall clot burden is mild. No evidence of right heart strain. No evidence of thoracic aortic aneurysm. Atherosclerotic calcifications of the aortic arch. Cardiomegaly.  No pericardial effusion. Mild coronary atherosclerosis of the LAD and right coronary artery. Mediastinum/Nodes: Calcified subcarinal nodes which do not meet pathologic CT size criteria. Visualized thyroid is  unremarkable. Lungs/Pleura: Evaluation of the lung parenchyma is constrained by respiratory motion. Within that constraint, there are no suspicious pulmonary nodules. Biapical pleural-parenchymal scarring. Moderate centrilobular and paraseptal emphysematous changes, upper lung predominant. Scattered calcified granulomata throughout the lungs bilaterally. Stable patchy subpleural right basilar opacity, unchanged from recent CT abdomen/pelvis, likely reflecting a pulmonary infarct in this context. Trace bilateral pleural effusions. No pneumothorax. Upper Abdomen: Visualized upper abdomen is unchanged from recent CT. Musculoskeletal: Bilateral breast augmentation. Stable mild superior endplate compression fracture deformity at T11. Multilevel degenerative changes of the visualized thoracolumbar spine. Review of the MIP images confirms the above findings. IMPRESSION: Segmental/subsegmental right lower lobe pulmonary emboli, as above. Overall clot burden is mild. No evidence of right heart strain. Critical Value/emergent results were called by telephone at the time of interpretation on 12/19/2018 at 7:20 am to Dr. Ripley Fraise , who verbally acknowledged these results. Suspected pulmonary infarct in the posterior right lower lobe, unchanged from recent CT abdomen/pelvis. Trace bilateral pleural effusions. Aortic Atherosclerosis (ICD10-I70.0) and Emphysema (ICD10-J43.9). Electronically Signed   By: Julian Hy M.D.   On: 12/19/2018 07:20   Dg Chest Port 1 View  Result Date: 12/22/2018 CLINICAL DATA:  Sepsis EXAM: PORTABLE CHEST 1 VIEW COMPARISON:  12/19/2018, 12/01/2018 FINDINGS: Bilateral diffuse mild interstitial thickening likely chronic. Left basilar airspace disease. No pleural effusion or pneumothorax. Stable cardiomediastinal silhouette. No aggressive osseous lesion. IMPRESSION: Left basilar airspace disease which may reflect atelectasis versus pneumonia. Electronically Signed   By: Kathreen Devoid   On:  12/22/2018 14:19   Vas [place] Lower Extremity Venous (dvt)  Result Date: 12/21/2018  Lower Venous Study Risk Factors: Confirmed PE. Limitations: Patient in pain/ low tolerance for compressions. Comparison Study: 12/10/18 negative Performing Technologist: June Leap RDMS, RVT  Examination Guidelines: A complete evaluation includes B-mode imaging, spectral Doppler, color Doppler, and power  Doppler as needed of all accessible portions of each vessel. Bilateral testing is considered an integral part of a complete examination. Limited examinations for reoccurring indications may be performed as noted.  +---------+---------------+---------+-----------+----------+--------------+  RIGHT     Compressibility Phasicity Spontaneity Properties Summary         +---------+---------------+---------+-----------+----------+--------------+  CFV       Full            Yes       Yes                                    +---------+---------------+---------+-----------+----------+--------------+  SFJ       Full                                                             +---------+---------------+---------+-----------+----------+--------------+  FV Prox   Full                                                             +---------+---------------+---------+-----------+----------+--------------+  FV Mid    Full                                                             +---------+---------------+---------+-----------+----------+--------------+  FV Distal Full                                                             +---------+---------------+---------+-----------+----------+--------------+  PFV       Full                                                             +---------+---------------+---------+-----------+----------+--------------+  POP                       Yes       Yes                                    +---------+---------------+---------+-----------+----------+--------------+  PTV                                                         Not visualized  +---------+---------------+---------+-----------+----------+--------------+  PERO  Not visualized  +---------+---------------+---------+-----------+----------+--------------+ Due to patient's very tender legs, unable to adequately image calf veins.  +---------+---------------+---------+-----------+----------+--------------+  LEFT      Compressibility Phasicity Spontaneity Properties Summary         +---------+---------------+---------+-----------+----------+--------------+  CFV       Full            Yes       Yes                                    +---------+---------------+---------+-----------+----------+--------------+  SFJ       Full                                                             +---------+---------------+---------+-----------+----------+--------------+  FV Prox   Full                                                             +---------+---------------+---------+-----------+----------+--------------+  FV Mid    Full                                                             +---------+---------------+---------+-----------+----------+--------------+  FV Distal Full                                                             +---------+---------------+---------+-----------+----------+--------------+  PFV       Full                                                             +---------+---------------+---------+-----------+----------+--------------+  POP                       Yes       Yes                                    +---------+---------------+---------+-----------+----------+--------------+  PTV                                                        Not visualized  +---------+---------------+---------+-----------+----------+--------------+  PERO  Not visualized  +---------+---------------+---------+-----------+----------+--------------+ Due to patient's very tender  legs, unable to adequately image calf veins.    Summary: Right: There is no evidence of deep vein thrombosis in the lower extremity. However, portions of this examination were limited- see technologist comments above. No cystic structure found in the popliteal fossa. Left: There is no evidence of deep vein thrombosis in the lower extremity. However, portions of this examination were limited- see technologist comments above. A cystic structure is found in the popliteal fossa.  *See table(s) above for measurements and observations. Electronically signed by Deitra Mayo MD on 12/21/2018 at 12:57:04 PM.    Final     Assessment and Plan:   1. Persistent atrial fibrillation Previously discontinued amiodarone by primary cardiologist secondary to later response in heart rate and tremors.  However, patient placed on amiodarone 200 mg daily during last admission due to elevated rate with good response and plan to discontinue eventually after seen by primary cardiologist as outpatient. -Patient heart rate is fairly controlled at 100-110's on amiodarone 200 mg daily.  Coreg has been discontinued due to hypotensive event this afternoon.  - Patient self d/c'd xarelto several months ago due to "blotches on hands".  -Now off heparin secondary to concern for internal bleeding  2.  Systolic heart failure - Volume status fairly stable. - Echo 11/2018 with  new cardiomyopathy with LVEF 25-30%, global hypokinesis and at least moderate RV systolic dysfunction as well as moderate to severe biatrial enlargement.   - Resume Coreg when stable BP. Consider ACE/ARB as outpatient as well  3.  Pulmonary emboli with pulmonary infection -Status post IVC filter -Heparin discontinued due to anemia and concern for bleeding  4. S/p R SFA stent - ASA and Plavix on hold. Per vascular surgery  5. L groin and abdomen hematoma - Pending CT  6.  Acute anemia -Plan to transfuse   For questions or updates, please contact [place] Please consult [email or web address] for contact info under     Jarrett Soho, PA  12/22/2018 4:11 PM   History and all data above reviewed.  Patient examined.  I agree with the findings as above. Complicated course as above.  Chronic fib.  Newly reduced EF (was 55% on echo at [facility] in Nov).  At the time of her recent hospitalization earlier this month when the reduced EF was noted it was felt to be secondary to septic shock/tachycardia.   She says that her O2 level was noted to be low at home.  She says that the home health nurse insisted she come to the hospital.  She and her husband both say that she was not doing particularly poorly and she had not noticed any new shortness of breath.  She did not really notice new palpitations.  She was on oxygen.  She is getting around the house with a walker.  However, currently she looks frail and chronically ill.  She has been persistently hypotensive with the complex medical problems as above.  Her heart rate however is in the 110s atrial fibrillation at rest.  The patient exam reveals COR: Irregular,  Lungs: Decreased breath sounds,  Abd: Mildly distended, positive bowel sounds, Ext diffuse lower extremity edema left greater than right with tenderness, Unna boots on both legs.  All available labs, radiology testing, previous records reviewed. Agree with documented assessment and plan.  Atrial fibrillation with rapid rate: The rate is actually well controlled given the severe comorbid illnesses.  She would not tolerate anything  for rate control other than the continued amiodarone.  I will think digoxin will play a significant role here and given the low therapeutic toxic window in this patient already on amiodarone I would not start it.  Unfortunately our options are limited.  I do not think she is a candidate for more aggressive heart failure therapies and do not suspect this is the primary issue regardless.  We will follow and try to offer therapies as  we are able.  Jeneen Rinks Kathyann Spaugh  5:54 PM  12/22/2018

## 2018-12-22 NOTE — Progress Notes (Signed)
Blood pressures trended down to 60/40 patient newly lethargic. Rapid Response called and MD Paged.

## 2018-12-22 NOTE — Progress Notes (Signed)
PROGRESS NOTE    Kelsey [name]   PFX:[id number]  DOB: 02/25/1932  DOA: 12/19/2018 PCP: [name], MD   Brief Narrative:  Kelsey [name] is an 85 y/o with spinal stenosis (moslty sedentary) chronic atrial fibrillation off of Xarelto due to "blotches on her hand", PVD,  breast implants, chronic systolic heart failure, pulmonary hypertension, hyperlipidemia, vocal cord paralysis, septic shock, AKI and incidental PT on last admission. She also was found on 5/19  to have a right calf hematoma without any known injury. She was on full dose Lovenox in the hospital and it was discontinued after this hematoma was found. She was later found to have a PE when a CT of the abd/pelvis. PE was in the RL base and was associated with a right subsegmental infarct. She had no symptoms and no DVT was found on venous duplex.  She returns to [facility] shortness of breath and is found to be hypoxic to 85% on RA and required 4 L. CTA Chest>  Segmental/subsegmental right lower lobe pulmonary emboli, as above. Overall clot burden is mild. Transferred to [facility] for IVC filter.   Subjective: Early in the morning the patient actually reported no acute complaint.  Was visibly short of breath.  No abdominal pain no chest pain or dizziness or lightheadedness.  No swelling in the leg no pain in the leg reported by the patient.  Later in the afternoon the patient become obtunded.  Would not respond to any commands.  Blood pressure dropped significantly.  Heart rate worsened. After emergent blood transfusion improvement in mentation.  Improvement in blood pressure and heart rate.  Still blood pressure low. Husband at bedside later in the afternoon patient remains hypotensive.  No active bleeding reported by patient.  Assessment & Plan:   Principal Problem:   Pulmonary emboli with pulmonary infarct/ acute respiratory failure - unable to anticoagulate due to calf hematomas bilaterally - she  has received an IVC filter by vascular surgery and had an Aortogram with b/l runoff due to PVD- see below Postprocedure patient was started on IV heparin. Hemoglobin dropped to 7.7 with hypotension. I suspect that this is a significant contraindication for the patient for anticoagulation. Discontinue all anticoagulation as well as antiplatelet medication.  Hemorrhagic shock. Blood pressure dropped to 50s.  Heart rate 120s. Patient went into A. fib with RVR. Patient also was febrile. Suspect this is actually more likely hemorrhagic shock again septic shock. Discussed with CCM patient not a great candidate for any aggressive intervention patient already DNR/DNI.  Prognosis is significantly poor and therefore recommending no pressors. Patient was given emergent transfusion after receiving consent from her husband as patient was having a life-threatening emergency. After the blood transfusion patient showed significant improvement in her mentation although blood pressure continues to remain low. We will provide 2 more units of blood transfusion. May require IV Lasix as the patient does have history of chronic diastolic CHF.   Goals of care discussion. Not a great situation here. Patient has a history of A. fib as well as PE but not a great candidate for anticoagulation. Any attempt of anticoagulation has resulted into worsening hematoma as well as bleeding. Currently I am suspecting that she is actually suffering from retroperitoneal hematoma or a groin hematoma or rectus sheath hematoma. CT abdomen and pelvis is ordered but currently unstable for performing that evaluation. We will be transfusing her 2 PRBC total of 3 today. If her hemodynamics does not improve and her mentation worsens  she may require IR guided embolization. Currently her goal is to continue to treat what is treatable. I recommended her to consider option of comfort care and palliative care as the patient's prognosis is  poor. Palliative care will follow up with the patient tomorrow.   A. fib with RVR. Acute diastolic dysfunction. Appreciate cardiology assistance.  Calf hematoma and severe PVD - apparently was a spontaneous hematoma while on anticoagulation in hospital (full dose Lovenox) noted on 5/19 - Right SFA stent should allow for increased perfusion to let wound heal - vascular surgery has started ASA and Plavix - legs currently wrapped  Fever 102.4 on 5/31 Septic work-up initiated. Follow-up on cultures. Currently on IV vancomycin and cefepime.    Hypertension,   Chronic systolic CHF (EF 03-55%),  Pulmonary HTN  -Hold Coreg, lasix    Chronic atrial fibrillation with RVR  -Hold Coreg, Amiodarone- tachycardic post op   - avoid AC - she was previously on Xarelto  -adding IV Lopressor PRN   Chronic back pain Chronic steroids. Patient is on chronic prednisone 50 mg daily. We will provide stress dose steroids.  Time spent in minutes: The patient is critically ill with multiple organ systems failure and requires high complexity decision making for assessment and support, frequent evaluation and titration of therapies. Critical Care Time devoted to patient care services described in this note is 45 minutes   DVT prophylaxis: Contraindicated for now.  Bilateral calf hematoma Code Status: DNR Family Communication: Discussed with [place]. Due to patient's life-threatening emergency request of husband to be at bedside.  Received permission from [facility].  Disposition Plan: will need PT eval Consultants:  Vascular surgery Cardiology Palliative care  Procedures:   6/2 1.  Ultrasound-guided access, left femoral artery             2.  Abdominal aortogram             3.  Bilateral lower extremity runoff             4.  Stent, right superficial femoral artery             5.  Closure device (M[employer]x             6.  Ultrasound-guided access, right femoral vein             7.  Inferior venacavogram                     8.  Placement of inferior vena cava filter             9.  Conscious sedation (87 minutes) Antimicrobials:  Anti-infectives (From admission, onward)   Start     Dose/Rate Route Frequency Ordered Stop   12/23/18 0400  ceFEPIme (MAXIPIME) 2 g in sodium chloride 0.9 % 100 mL IVPB     2 g 200 mL/hr over 30 Minutes Intravenous Every 12 hours 12/22/18 1444     12/22/18 1600  vancomycin (VANCOCIN) 1,500 mg in sodium chloride 0.9 % 500 mL IVPB     1,500 mg 250 mL/hr over 120 Minutes Intravenous Every 24 hours 12/22/18 1444     12/22/18 1400  ceFEPIme (MAXIPIME) 2 g in sodium chloride 0.9 % 100 mL IVPB     2 g 200 mL/hr over 30 Minutes Intravenous  Once 12/22/18 1353 12/22/18 1603   12/22/18 1400  vancomycin (VANCOCIN) 1,250 mg in sodium chloride 0.9 % 250 mL IVPB  Status:  Discontinued  1,250 mg 166.7 mL/hr over 90 Minutes Intravenous  Once 12/22/18 1353 12/22/18 1444       Objective: Vitals:   12/22/18 1655 12/22/18 1830 12/22/18 1845 12/22/18 1900  BP: (!) 73/52 (!) 79/56 (!) 83/56 (!) 74/41  Pulse: (!) 113 (!) 108  (!) 106  Resp: (!) 22 20  (!) 23  Temp:      TempSrc:      SpO2: 100% 100%  100%  Weight:      Height:        Intake/Output Summary (Last 24 hours) at 12/22/2018 1940 Last data filed at 12/22/2018 1300 Gross per 24 hour  Intake 892.76 ml  Output 225 ml  Net 667.76 ml   Filed Weights   12/19/18 1045 12/20/18 2038 12/21/18 0500  Weight: 80.8 kg 79.1 kg 81.6 kg    Examination: General exam: Appears comfortable  HEENT: PERRLA, oral mucosa moist, no sclera icterus or thrush Respiratory system: Clear to auscultation. Respiratory effort normal. Cardiovascular system: S1 & S2 heard, IIRR.   Gastrointestinal system: Abdomen soft, non-tender, nondistended. Normal bowel sounds. Central nervous system: Alert and oriented. No focal neurological deficits. Extremities: No cyanosis, clubbing or edema Skin: both legs are wrapped Psychiatry:  Mood & affect  appropriate.     Data Reviewed: I have personally reviewed following labs and imaging studies  CBC: Recent Labs  Lab 12/19/18 0341  12/20/18 0400 12/20/18 1401 12/21/18 0535 12/22/18 0306 12/22/18 1346 12/22/18 1630  WBC 9.4  --  11.5*  --  11.3* 3.5* 2.4* 3.3*  NEUTROABS 6.2  --   --   --   --   --   --   --   HGB 10.1*   < > 9.9* 9.6* 9.4* 7.7* 7.2* 8.1*  HCT 33.7*   < > 31.4* 30.5* 29.8* 23.7* 22.8* 25.0*  MCV 112.3*  --  109.4*  --  104.9* 103.9* 104.6* 100.4*  PLT 298  --  263  --  201 153 132* 118*   < > = values in this interval not displayed.   Basic Metabolic Panel: Recent Labs  Lab 12/19/18 0341 12/20/18 0400 12/21/18 0535 12/22/18 0306  NA 142 139 140 139  K 4.5 4.4 4.1 3.7  CL 110 107 104 105  CO2 24 24 28 23   GLUCOSE 84 90 92 78  BUN 22 17 15 16   CREATININE 0.80 0.68 0.87 0.91  CALCIUM 8.7* 8.2* 8.2* 7.5*   GFR: Estimated Creatinine Clearance: 51.6 mL/min (by C-G formula based on SCr of 0.91 mg/dL). Liver Function Tests: No results for input(s): AST, ALT, ALKPHOS, BILITOT, PROT, ALBUMIN in the last 168 hours. No results for input(s): LIPASE, AMYLASE in the last 168 hours. No results for input(s): AMMONIA in the last 168 hours. Coagulation Profile: Recent Labs  Lab 12/22/18 1346  INR 1.7*   Cardiac Enzymes: No results for input(s): CKTOTAL, CKMB, CKMBINDEX, TROPONINI in the last 168 hours. BNP (last 3 results) No results for input(s): PROBNP in the last 8760 hours. HbA1C: No results for input(s): HGBA1C in the last 72 hours. CBG: Recent Labs  Lab 12/22/18 1416  GLUCAP 101*   Lipid Profile: No results for input(s): CHOL, HDL, LDLCALC, TRIG, CHOLHDL, LDLDIRECT in the last 72 hours. Thyroid Function Tests: No results for input(s): TSH, T4TOTAL, FREET4, T3FREE, THYROIDAB in the last 72 hours. Anemia Panel: No results for input(s): VITAMINB12, FOLATE, FERRITIN, TIBC, IRON, RETICCTPCT in the last 72 hours. Urine analysis:    Component  Value Date/Time  COLORURINE YELLOW 12/20/2018 1603   APPEARANCEUR CLEAR 12/20/2018 1603   LABSPEC 1.014 12/20/2018 1603   PHURINE 6.0 12/20/2018 1603   GLUCOSEU NEGATIVE 12/20/2018 1603   HGBUR NEGATIVE 12/20/2018 1603   BILIRUBINUR NEGATIVE 12/20/2018 1603   KETONESUR NEGATIVE 12/20/2018 1603   PROTEINUR NEGATIVE 12/20/2018 1603   NITRITE NEGATIVE 12/20/2018 1603   LEUKOCYTESUR NEGATIVE 12/20/2018 1603   Sepsis Labs: @LABRCNTIP (procalcitonin:4,lacticidven:4) ) Recent Results (from the past 240 hour(s))  SARS Coronavirus 2 (CEPHEID - Performed in [place] hospital lab), Hosp Order     Status: None   Collection Time: 12/19/18  3:41 AM  Result Value Ref Range Status   SARS Coronavirus 2 NEGATIVE NEGATIVE Final    Comment: (NOTE) If result is NEGATIVE SARS-CoV-2 target nucleic acids are NOT DETECTED. The SARS-CoV-2 RNA is generally detectable in upper and lower  respiratory specimens during the acute phase of infection. The lowest  concentration of SARS-CoV-2 viral copies this assay can detect is 250  copies / mL. A negative result does not preclude SARS-CoV-2 infection  and should not be used as the sole basis for treatment or other  patient management decisions.  A negative result may occur with  improper specimen collection / handling, submission of specimen other  than nasopharyngeal swab, presence of viral mutation(s) within the  areas targeted by this assay, and inadequate number of viral copies  (<250 copies / mL). A negative result must be combined with clinical  observations, patient history, and epidemiological information. If result is POSITIVE SARS-CoV-2 target nucleic acids are DETECTED. The SARS-CoV-2 RNA is generally detectable in upper and lower  respiratory specimens dur ing the acute phase of infection.  Positive  results are indicative of active infection with SARS-CoV-2.  Clinical  correlation with patient history and other diagnostic information is   necessary to determine patient infection status.  Positive results do  not rule out bacterial infection or co-infection with other viruses. If result is PRESUMPTIVE POSTIVE SARS-CoV-2 nucleic acids MAY BE PRESENT.   A presumptive positive result was obtained on the submitted specimen  and confirmed on repeat testing.  While 2019 novel coronavirus  (SARS-CoV-2) nucleic acids may be present in the submitted sample  additional confirmatory testing may be necessary for epidemiological  and / or clinical management purposes  to differentiate between  SARS-CoV-2 and other Sarbecovirus currently known to infect humans.  If clinically indicated additional testing with an alternate test  methodology [phone]) is advised. The SARS-CoV-2 RNA is generally  detectable in upper and lower respiratory sp ecimens during the acute  phase of infection. The expected result is Negative. Fact Sheet for Patients:  StrictlyIdeas.no Fact Sheet for Healthcare Providers: BankingDealers.co.za This test is not yet approved or cleared by the [place] FDA and has been authorized for detection and/or diagnosis of SARS-CoV-2 by FDA under an Emergency Use Authorization (EUA).  This EUA will remain in effect (meaning this test can be used) for the duration of the COVID-19 declaration under Section 564(b)(1) of the Act, 21 U.S.C. section 360bbb-3(b)(1), unless the authorization is terminated or revoked sooner. Performed at [facility], [place] [address] [place], [place] [address]   Culture, blood (Routine X 2) w Reflex to ID Panel     Status: None (Preliminary result)   Collection Time: 12/20/18  4:13 PM  Result Value Ref Range Status   Specimen Description   Final    BLOOD RIGHT HAND Performed at [facility] [address]  [place], [place] [address]    Special Requests   Final    BOTTLES DRAWN AEROBIC AND ANAEROBIC Blood  Culture adequate volume Performed at [facility] [address] [place], [place] [address]    Culture   Final    NO GROWTH 2 DAYS Performed at [facility] [address] [place], [place] [address]    Report Status PENDING  Incomplete  Culture, blood (Routine X 2) w Reflex to ID Panel     Status: None (Preliminary result)   Collection Time: 12/20/18  4:19 PM  Result Value Ref Range Status   Specimen Description   Final    BLOOD RIGHT HAND Performed at [facility] [address] [place], [place] [address]    Special Requests   Final    BOTTLES DRAWN AEROBIC ONLY Blood Culture results may not be optimal due to an inadequate volume of blood received in culture bottles Performed at [facility] [address], [place], [place] [address]    Culture   Final    NO GROWTH 2 DAYS Performed at [facility] [address], [place],  [address]    Report Status PENDING  Incomplete         Radiology Studies: Dg Chest Port 1 View  Result Date: 12/22/2018 CLINICAL DATA:  Sepsis EXAM: PORTABLE CHEST 1 VIEW COMPARISON:  12/19/2018, 12/01/2018 FINDINGS: Bilateral diffuse mild interstitial thickening likely chronic. Left basilar airspace disease. No pleural effusion or pneumothorax. Stable cardiomediastinal silhouette. No aggressive osseous lesion. IMPRESSION: Left basilar airspace disease which may reflect atelectasis versus pneumonia. Electronically Signed   By: Kathreen Devoid   On: 12/22/2018 14:19      Scheduled Meds: . sodium chloride   Intravenous Once  . sodium chloride   Intravenous Once  . amiodarone  200 mg Oral Daily  . docusate sodium  100 mg Oral BID  . feeding supplement (ENSURE ENLIVE)  237 mL Oral BID BM  . hydrocortisone sod succinate (SOLU-CORTEF) inj  100 mg Intravenous Q8H  . latanoprost  1 drop Both Eyes QHS  . nystatin   Topical BID  . sodium chloride flush  3 mL Intravenous Q12H    Continuous Infusions: . sodium chloride 100 mL/hr at 12/22/18 1741  . sodium chloride    . [START ON 12/23/2018] ceFEPime (MAXIPIME) IV    . vancomycin 1,500 mg (12/22/18 1745)     LOS: 2 days      Berle Mull, MD [facility] Pager: [email or web address] Password [email or web address] 12/22/2018, 7:40 PM

## 2018-12-23 ENCOUNTER — Inpatient Hospital Stay (HOSPITAL_COMMUNITY): Payer: Medicare HMO

## 2018-12-23 DIAGNOSIS — A419 Sepsis, unspecified organism: Secondary | ICD-10-CM

## 2018-12-23 DIAGNOSIS — Z515 Encounter for palliative care: Secondary | ICD-10-CM

## 2018-12-23 DIAGNOSIS — R6521 Severe sepsis with septic shock: Secondary | ICD-10-CM

## 2018-12-23 DIAGNOSIS — Z7189 Other specified counseling: Secondary | ICD-10-CM

## 2018-12-23 DIAGNOSIS — I4819 Other persistent atrial fibrillation: Secondary | ICD-10-CM

## 2018-12-23 LAB — CBC WITH DIFFERENTIAL/PLATELET
Abs Immature Granulocytes: 0.6 10*3/uL — ABNORMAL HIGH (ref 0.00–0.07)
Band Neutrophils: 25 %
Basophils Absolute: 0 10*3/uL (ref 0.0–0.1)
Basophils Relative: 0 %
Eosinophils Absolute: 0 10*3/uL (ref 0.0–0.5)
Eosinophils Relative: 0 %
HCT: 35.3 % — ABNORMAL LOW (ref 36.0–46.0)
Hemoglobin: 11.4 g/dL — ABNORMAL LOW (ref 12.0–15.0)
Lymphocytes Relative: 14 %
Lymphs Abs: 0.9 10*3/uL (ref 0.7–4.0)
MCH: 32.4 pg (ref 26.0–34.0)
MCHC: 32.3 g/dL (ref 30.0–36.0)
MCV: 100.3 fL — ABNORMAL HIGH (ref 80.0–100.0)
Metamyelocytes Relative: 9 %
Monocytes Absolute: 0.9 10*3/uL (ref 0.1–1.0)
Monocytes Relative: 15 %
Myelocytes: 1 %
Neutro Abs: 3.7 10*3/uL (ref 1.7–7.7)
Neutrophils Relative %: 36 %
Platelets: 104 10*3/uL — ABNORMAL LOW (ref 150–400)
RBC: 3.52 MIL/uL — ABNORMAL LOW (ref 3.87–5.11)
RDW: 17.6 % — ABNORMAL HIGH (ref 11.5–15.5)
WBC: 6.1 10*3/uL (ref 4.0–10.5)
nRBC: 0 % (ref 0.0–0.2)

## 2018-12-23 LAB — BPAM RBC
Blood Product Expiration Date: 202007072359
Blood Product Expiration Date: 202007072359
Blood Product Expiration Date: 202007072359
ISSUE DATE / TIME: 202006031430
ISSUE DATE / TIME: 202006032028
ISSUE DATE / TIME: 202006032332
Unit Type and Rh: 5100
Unit Type and Rh: 5100
Unit Type and Rh: 5100

## 2018-12-23 LAB — URINALYSIS, COMPLETE (UACMP) WITH MICROSCOPIC
Bilirubin Urine: NEGATIVE
Glucose, UA: NEGATIVE mg/dL
Hgb urine dipstick: NEGATIVE
Ketones, ur: NEGATIVE mg/dL
Leukocytes,Ua: NEGATIVE
Nitrite: NEGATIVE
Protein, ur: 30 mg/dL — AB
Specific Gravity, Urine: 1.032 — ABNORMAL HIGH (ref 1.005–1.030)
pH: 5 (ref 5.0–8.0)

## 2018-12-23 LAB — TYPE AND SCREEN
ABO/RH(D): O NEG
Antibody Screen: NEGATIVE
Unit division: 0
Unit division: 0
Unit division: 0

## 2018-12-23 LAB — COMPREHENSIVE METABOLIC PANEL
ALT: 13 U/L (ref 0–44)
AST: 18 U/L (ref 15–41)
Albumin: 1.8 g/dL — ABNORMAL LOW (ref 3.5–5.0)
Alkaline Phosphatase: 47 U/L (ref 38–126)
Anion gap: 11 (ref 5–15)
BUN: 27 mg/dL — ABNORMAL HIGH (ref 8–23)
CO2: 16 mmol/L — ABNORMAL LOW (ref 22–32)
Calcium: 7.3 mg/dL — ABNORMAL LOW (ref 8.9–10.3)
Chloride: 113 mmol/L — ABNORMAL HIGH (ref 98–111)
Creatinine, Ser: 1.12 mg/dL — ABNORMAL HIGH (ref 0.44–1.00)
GFR calc Af Amer: 52 mL/min — ABNORMAL LOW (ref >60)
GFR calc non Af Amer: 45 mL/min — ABNORMAL LOW (ref >60)
Glucose, Bld: 174 mg/dL — ABNORMAL HIGH (ref 70–99)
Potassium: 3.9 mmol/L (ref 3.5–5.1)
Sodium: 140 mmol/L (ref 135–145)
Total Bilirubin: 1.6 mg/dL — ABNORMAL HIGH (ref 0.3–1.2)
Total Protein: 4.9 g/dL — ABNORMAL LOW (ref 6.5–8.1)

## 2018-12-23 MED ORDER — SODIUM CHLORIDE 0.9 % IV BOLUS
250.0000 mL | Freq: Once | INTRAVENOUS | Status: AC
  Administered 2018-12-23: 02:00:00 250 mL via INTRAVENOUS

## 2018-12-23 MED ORDER — IOPAMIDOL (ISOVUE-300) INJECTION 61%: 80.0000 mL | Freq: Once | INTRAVENOUS | Status: DC | PRN

## 2018-12-23 MED ORDER — OXYCODONE HCL 5 MG PO TABS: 5.0000 mg | ORAL_TABLET | ORAL | Status: DC | PRN

## 2018-12-23 MED ORDER — IOHEXOL 300 MG/ML  SOLN
80.0000 mL | Freq: Once | INTRAMUSCULAR | Status: AC | PRN
  Administered 2018-12-23: 12:00:00 80 mL via INTRAVENOUS

## 2018-12-23 MED ORDER — SODIUM CHLORIDE 0.9 % IV SOLN
2.0000 g | INTRAVENOUS | Status: DC
  Administered 2018-12-24 – 2018-12-25 (×2): 2 g via INTRAVENOUS
  Filled 2018-12-23 (×2): qty 2

## 2018-12-23 MED ORDER — VANCOMYCIN HCL 10 G IV SOLR
1500.0000 mg | INTRAVENOUS | Status: DC
  Administered 2018-12-24: 1500 mg via INTRAVENOUS
  Filled 2018-12-23 (×2): qty 1500

## 2018-12-23 MED ORDER — LACTATED RINGERS IV SOLN
INTRAVENOUS | Status: DC
  Administered 2018-12-23 – 2018-12-25 (×4): via INTRAVENOUS

## 2018-12-23 NOTE — Progress Notes (Signed)
-  Rehab Admissions Coordinator Note:  Per PT recommendation, this patient was screened by Jhonnie Garner for appropriateness for an Inpatient Acute Rehab Consult. Noted pt's prognosis is poor and she is currently unable to tolerate sitting EOB without drop in BP. Pt would not be able to tolerate 3 hours of therapy a day at this time. AC will continue to follow pt progress in the hopes that she will improve her activity tolerance to show potential for CIR.   Jhonnie Garner 12/23/2018, 2:58 PM  I can be reached at [phone]

## 2018-12-23 NOTE — Consult Note (Signed)
Consultation Note Date: 12/23/2018   Patient Name: Kelsey [name]  DOB: 02/05/1932  MRN: [id number]  Age / Sex: 85 y.o., female  PCP: [name], MD Referring Physician: Lavina Hamman, MD  Reason for Consultation: Establishing goals of care  HPI/Patient Profile: 85 y.o. female  with past medical history of spinal stenosis, chronic systolic heart failure- recent ECHO showing EF 24-30%, afib, pulmonary HTN, HLD recently admitted for septic shock d/t E.Coli UTI and Strep bacteremia- during that admission found to have PE with lung infarction but could not tolerate anticoagulation due to R calf hematoma, now admitted on 12/19/2018 with SOB- was started on heparin drip d/t PE, transferred to [facility] from [facility] for IVC filter placement (s/p placement 6/2). 6/1 she had a large tarry black stool and significant drop in BP- was given 2 units emergency transfusion for possible hemorrhagic shock. PCT is significantly elevated at 36 showing concern for sepsis as well. She continues to have persistent atrial fib and heart failure that are difficult to control due to continued hypotension. She cannot be anticoagulated due to her tendency to bleed. She was home only 8 days after admission for septic shock and PE before returning to this hospitalization. Her albumin is 1.8. Her prognosis for meaningful recover is poor- palliative medicine consulted for [place].    Clinical Assessment and Goals of Care:  I have reviewed medical records including EPIC notes, labs and imaging, received report from Dr. Posey Pronto, assessed the patient and then met at the bedside with her to discuss diagnosis prognosis, GOC, EOL wishes, disposition and options.  We discussed a brief life review of the patient. 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 She and her husband both have one  child separately.   As far as functional and nutritional status- her appetite has been very decreased. She has a tray at the bedside but she tells me she isn't hungry. She has not been hungry in a long time. At home she was able to ambulate with a walker from her bed to the bathroom but that was about it.    We discussed her current illness and what it means in the larger context of her on-going co-morbidities.  Natural disease trajectory and expectations at EOL were discussed. She is concerned that she is very close to EOL, and I agreed with her.    The difference between aggressive medical intervention and comfort care was discussed and these options were offered to the patient. We discussed the option of discontinuing antibiotics, IV fluids, cardiac monitoring- and focusing on symptom control and supporting her through EOL and peaceful natural dying process. She stated "I want that". She then stated that she and her husband had discussed, and he had gone to the funeral home "to pay for my funeral".   I attempted to confirm with her that her preference was for comfort care when she stated "I want that"- however, she then stated "I'm through talking for  now".   She denied pain or discomfort although she did appear SOB- however, she denied that this was bothersome to her.   She gave me permission to call and discuss further with her husband.   Primary Decision Maker PATIENT    SUMMARY OF RECOMMENDATIONS -I believe patient would prefer to transition to comfort care, however, I would like to confirm this with her spouse -I called him, however, he was at funeral home- gave him my phone number and he stated he will call me back -Recommend continue current care and discuss any escalation with husband before proceeding  -Recommend continuing to allow patient's spouse to be at bedside for decision making    Prognosis:    Unable to determine - if patient elects comfort measures only- would be less  than two weeks due to likely sepsis, severe heart failure, little to no po intake   Primary Diagnoses: Present on Admission: . (Resolved) Acute pulmonary embolism ([place]) . Chronic atrial fibrillation with RVR . Hypertension . Pulmonary HTN ([place])   I have reviewed the medical record, interviewed the patient and family, and examined the patient. The following aspects are pertinent.  Past Medical History:  Diagnosis Date  . Arthritis    DJD  . Cancer ([place])    basal cell CA  . Chronic anticoagulation    on Xarelto  . Chronic atrial fibrillation    s/p failed DCCV now on rate control  . Diastolic dysfunction    by echo 07/2012  . Glaucoma   . Hyperlipidemia   . Hypertension   . Pulmonary HTN ([facility])    mild - RVSP 40-91mHg  . Vocal cord paralysis    Social History   Socioeconomic History  . Marital status: Married    Spouse name: Not on file  . Number of children: Not on file  . Years of education: Not on file  . Highest education level: Not on file  Occupational History  . Not on file  Social Needs  . Financial resource strain: Not on file  . Food insecurity:    Worry: Not on file    Inability: Not on file  . Transportation needs:    Medical: Not on file    Non-medical: Not on file  Tobacco Use  . Smoking status: Former SResearch scientist (life sciences) . Smokeless tobacco: Never Used  Substance and Sexual Activity  . Alcohol use: Yes    Comment: occasional wine  . Drug use: No  . Sexual activity: Not on file  Lifestyle  . Physical activity:    Days per week: Not on file    Minutes per session: Not on file  . Stress: Not on file  Relationships  . Social connections:    Talks on phone: Not on file    Gets together: Not on file    Attends religious service: Not on file    Active member of club or organization: Not on file    Attends meetings of clubs or organizations: Not on file    Relationship status: Not on file  Other Topics Concern  . Not on file  Social History Narrative   . Not on file   Family History  Problem Relation Age of Onset  . Alzheimer's disease Mother    Scheduled Meds: . amiodarone  200 mg Oral Daily  . docusate sodium  100 mg Oral BID  . feeding supplement (ENSURE ENLIVE)  237 mL Oral BID BM  . hydrocortisone sod succinate (SOLU-CORTEF) inj  100  mg Intravenous Q8H  . latanoprost  1 drop Both Eyes QHS  . nystatin   Topical BID  . sodium chloride flush  3 mL Intravenous Q12H   Continuous Infusions: . ceFEPime (MAXIPIME) IV Stopped (12/23/18 0515)  . lactated ringers 75 mL/hr at 12/23/18 1318  . vancomycin 1,500 mg (12/22/18 1745)   PRN Meds:.acetaminophen **OR** acetaminophen, iopamidol, ondansetron (ZOFRAN) IV, oxyCODONE, polyethylene glycol, sodium chloride flush Medications Prior to Admission:  Prior to Admission medications   Medication Sig Start Date End Date Taking? Authorizing Provider  amiodarone (PACERONE) 200 MG tablet Take 1 tablet (200 mg total) by mouth daily. 12/11/18  Yes Lavina Hamman, MD  carvedilol (COREG) 25 MG tablet Take 1 tablet (25 mg total) by mouth 2 (two) times daily with a meal. 12/10/18  Yes Lavina Hamman, MD  ferrous sulfate 325 (65 FE) MG tablet Take 1 tablet (325 mg total) by mouth 3 (three) times daily with meals. 12/10/18 01/10/19 Yes Lavina Hamman, MD  furosemide (LASIX) 20 MG tablet Take 1 tablet (20 mg total) by mouth daily as needed for fluid or edema (weight gain>3Lbs in 1 day). 12/10/18  Yes Lavina Hamman, MD  HYDROcodone-acetaminophen (NORCO/VICODIN) 5-325 MG tablet Take 1 tablet by mouth every 6 (six) hours as needed for moderate pain.   Yes [provider]  hydroxypropyl methylcellulose / hypromellose (ISOPTO TEARS / GONIOVISC) 2.5 % ophthalmic solution Place 1 drop into both eyes 4 (four) times daily as needed for dry eyes.   Yes [provider]  latanoprost (XALATAN) 0.005 % ophthalmic solution Place 1 drop into both eyes at bedtime.   Yes [provider]   neomycin-bacitracin-polymyxin (NEOSPORIN) ointment Apply 1 application topically as needed for wound care.   Yes [provider]  predniSONE (DELTASONE) 10 MG tablet Take 2 tablets (20 mg total) by mouth daily with breakfast. Patient taking differently: Take 15 mg by mouth daily with breakfast.  12/10/18  Yes Lavina Hamman, MD  docusate sodium (COLACE) 100 MG capsule Take 1 capsule (100 mg total) by mouth 2 (two) times daily. Patient not taking: Reported on 12/19/2018 12/10/18   Lavina Hamman, MD   Allergies  Allergen Reactions  . Cardizem [Diltiazem Hcl] Swelling  . Clonidine Derivatives Swelling  . Doxazosin Swelling   Review of Systems  Physical Exam Vitals signs and nursing note reviewed.  Constitutional:      Appearance: She is ill-appearing.  Cardiovascular:     Rate and Rhythm: Tachycardia present.  Pulmonary:     Comments: Increased RR Skin:    Coloration: Skin is pale.  Neurological:     Mental Status: She is alert.  Psychiatric:        Mood and Affect: Mood normal.        Thought Content: Thought content normal.     Vital Signs: BP 95/70   Pulse (!) 104   Temp 97.7 F (36.5 C) (Axillary)   Resp 15   Ht 5' 8"  (1.727 m)   Wt 84.2 kg   SpO2 100%   BMI 28.22 kg/m  Pain Scale: 0-10   Pain Score: 0-No pain   SpO2: SpO2: 100 % O2 Device:SpO2: 100 % O2 Flow Rate: .O2 Flow Rate (L/min): 2 L/min  IO: Intake/output summary:   Intake/Output Summary (Last 24 hours) at 12/23/2018 1521 Last data filed at 12/23/2018 0730 Gross per 24 hour  Intake 1660 ml  Output 575 ml  Net 1085 ml    LBM: Last  BM Date: 12/21/18 Baseline Weight: Weight: 80.6 kg Most recent weight: Weight: 84.2 kg     Palliative Assessment/Data: PPS: 20%     Thank you for this consult. Palliative medicine will continue to follow and assist as needed.   Time In: 1330 Time Out: 1500 Time Total: 90 minutes Greater than 50%  of this time was spent counseling and coordinating care  related to the above assessment and plan.  Signed by: Mariana Kaufman, AGNP-C Palliative Medicine    Please contact Palliative Medicine Team phone at [phone] for questions and concerns.  For individual provider: See Shea Evans

## 2018-12-23 NOTE — Progress Notes (Addendum)
  Progress Note    12/23/2018 7:36 AM 2 Days Post-Op  Subjective: No complaints this morning.  No subjective fever.  Denies abd or groin pain.  A&O   Vitals:   12/23/18 0005 12/23/18 0218  BP: 96/62   Pulse:  (!) 104  Resp:    Temp: 98.9 F (37.2 C) 99 F (37.2 C)  SpO2:     Physical Exam: Lungs:  Non labored on RA Extremities:  Unna boots in place, feet warm to touch with good cap refill; tender along medial L thigh in area of erythema/cellulitis; no palpable hematoma L groin Abdomen:  L flank superficial hematoma Neurologic: A&O  CBC    Component Value Date/Time   WBC 3.3 (L) 12/22/2018 1630   RBC 2.49 (L) 12/22/2018 1630   HGB 8.1 (L) 12/22/2018 1630   HCT 25.0 (L) 12/22/2018 1630   HCT 39.2 12/04/2018 0844   PLT 118 (L) 12/22/2018 1630   MCV 100.4 (H) 12/22/2018 1630   MCH 32.5 12/22/2018 1630   MCHC 32.4 12/22/2018 1630   RDW 17.6 (H) 12/22/2018 1630   LYMPHSABS 2.1 12/19/2018 0341   MONOABS 0.6 12/19/2018 0341   EOSABS 0.1 12/19/2018 0341   BASOSABS 0.1 12/19/2018 0341    BMET    Component Value Date/Time   NA 139 12/22/2018 0306   K 3.7 12/22/2018 0306   CL 105 12/22/2018 0306   CO2 23 12/22/2018 0306   GLUCOSE 78 12/22/2018 0306   BUN 16 12/22/2018 0306   CREATININE 0.91 12/22/2018 0306   CALCIUM 7.5 (L) 12/22/2018 0306   GFRNONAA 58 (L) 12/22/2018 0306   GFRAA >60 12/22/2018 0306    INR    Component Value Date/Time   INR 1.7 (H) 12/22/2018 1346     Intake/Output Summary (Last 24 hours) at 12/23/2018 0736 Last data filed at 12/23/2018 0644 Gross per 24 hour  Intake 1660 ml  Output 200 ml  Net 1460 ml     Assessment/Plan:  85 y.o. female is s/p IVC filter, SFA stent 2 Days Post-Op   -Transfused PRBC yesterday; CBC pending -No hematoma L groin; superficial L flank hematoma but would not explain differential diagnosis of hemorrhagic shock -Recommend restarting asa and plavix when possible -Continue unna boots BLE -Agree with continued  IV abx for cellulitis LLE    Dagoberto Ligas, PA-C [facility] [phone] 12/23/2018 7:36 AM  Agree with above  Ruta Hinds, MD [facility] [place] Office: [phone] Pager: [phone]

## 2018-12-23 NOTE — Care Management Important Message (Signed)
Important Message  Patient Details  Name: Kelsey [name] MRN: [id number] Date of Birth: [birth year]   Medicare Important Message Given:  Yes    Orbie Pyo 12/23/2018, 2:24 PM

## 2018-12-23 NOTE — Progress Notes (Signed)
Kelsey [name]   CHY:[id number]  DOB: Nov 20, 1931  DOA: 12/19/2018 PCP: [name], MD   Brief Narrative:  Kelsey [name] is an 85 y/o with spinal stenosis (moslty sedentary) chronic atrial fibrillation off of Xarelto due to "blotches on her hand", PVD,  breast implants, chronic systolic heart failure, pulmonary hypertension, hyperlipidemia, vocal cord paralysis, septic shock, AKI and incidental PT on last admission. She also was found on 5/19  to have a right calf hematoma without any known injury. She was on full dose Lovenox in the hospital and it was discontinued after this hematoma was found. She was later found to have a PE when a CT of the abd/pelvis. PE was in the RL base and was associated with a right subsegmental infarct. She had no symptoms and no DVT was found on venous duplex.  She returns to [facility] shortness of breath and is found to be hypoxic to 85% on RA and required 4 L. CTA Chest>  Segmental/subsegmental right lower lobe pulmonary emboli, as above. Overall clot burden is mild. Transferred to [facility] for IVC filter.   Subjective: No nausea or vomiting.  Reports abdominal pain.  No fever no chills.  Passing gas.  Assessment & Plan:   Principal Problem:   Pulmonary emboli with pulmonary infarct/ acute respiratory failure - unable to anticoagulate due to calf hematomas bilaterally - she has received an IVC filter by vascular surgery and had an Aortogram with b/l runoff due to PVD- see below Postprocedure patient was started on IV heparin. Hemoglobin dropped to 7.7 with hypotension. I suspect that this is a significant contraindication for the patient for anticoagulation. Discontinue all anticoagulation as well as antiplatelet medication.  Shock. Hemorrhagic versus septic. Blood pressure dropped to 50s.  Heart rate 120s. Patient went into A. fib with RVR. Patient also was febrile. Suspect this is actually more likely  hemorrhagic shock again septic shock. CT abdomen pelvis performed on 12/23/2018 shows no evidence of significant hematoma.  Mild groin hematoma which does not explain hemorrhagic soft as a sole etiology although patient did respond to massive blood transfusion.  Discussed with CCM patient not a great candidate for any aggressive intervention patient already DNR/DNI.  Prognosis is significantly poor and therefore recommending no pressors. Patient was given emergent transfusion after receiving consent from her husband as patient was having a life-threatening emergency. After the blood transfusion patient showed significant improvement in her mentation although blood pressure continues to remain low. Patient received total of 3 PRBC transfusion with improvement in hemoglobin up to 11. Remains hypotensive for now. Continue Solu-Cortef.  Continue IV antibiotics.   Goals of care discussion. Not a great situation here. Patient has a history of A. fib as well as PE but not a great candidate for anticoagulation. Any attempt of anticoagulation has resulted into worsening hematoma as well as bleeding. Currently I am suspecting that she is actually suffering from retroperitoneal hematoma or a groin hematoma or rectus sheath hematoma. CT abdomen and pelvis is ordered but currently unstable for performing that evaluation. Patient has been 3 PRBC with improvement in hemoglobin but no improvement in her blood pressure. Urine output worsening.  Renal function worsening.  Liver function worsening. Currently her goal is to continue to treat what is treatable. I recommended her to consider option of comfort care and palliative care as the patient's prognosis is poor. Palliative care will follow up with the patient tomorrow.   A. fib with RVR. Acute  diastolic dysfunction. Appreciate cardiology assistance. Currently appears her cardiac problems are secondary to other medical etiologies.  Will monitor.  Calf  hematoma and severe PVD - apparently was a spontaneous hematoma while on anticoagulation in hospital (full dose Lovenox) noted on 5/19 - Right SFA stent should allow for increased perfusion to let wound heal - vascular surgery has started ASA and Plavix - legs currently wrapped  Fever 102.4 on 5/31 Septic work-up initiated. Follow-up on cultures. Currently on IV vancomycin and cefepime.    Hypertension,   Chronic systolic CHF (EF 25-85%),  Pulmonary HTN  -Hold Coreg, lasix    Chronic atrial fibrillation with RVR  -Hold Coreg, Amiodarone- tachycardic post op   - avoid AC - she was previously on Xarelto  -adding IV Lopressor PRN   Chronic back pain Chronic steroids. Patient is on chronic prednisone 50 mg daily. We will provide stress dose steroids.  Time spent in minutes: 35 minutes   DVT prophylaxis: Contraindicated for now.  Bilateral calf hematoma Code Status: DNR Family Communication: Discussed with [place]. Due to patient's life-threatening emergency request of husband to be at bedside.  Received permission from [facility].  Disposition Plan: will need PT eval Consultants:  Vascular surgery Cardiology Palliative care  Procedures:   6/2 1.  Ultrasound-guided access, left femoral artery             2.  Abdominal aortogram             3.  Bilateral lower extremity runoff             4.  Stent, right superficial femoral artery             5.  Closure device (Mynx             6.  Ultrasound-guided access, right femoral vein             7.  Inferior venacavogram                    8.  Placement of inferior vena cava filter             9.  Conscious sedation (87 minutes) Antimicrobials:  Anti-infectives (From admission, onward)   Start     Dose/Rate Route Frequency Ordered Stop   12/24/18 0600  vancomycin (VANCOCIN) 1,500 mg in sodium chloride 0.9 % 500 mL IVPB     1,500 mg 250 mL/hr over 120 Minutes Intravenous Every 36 hours 12/23/18 1645     12/24/18 0600  ceFEPIme  (MAXIPIME) 2 g in sodium chloride 0.9 % 100 mL IVPB     2 g 200 mL/hr over 30 Minutes Intravenous Every 24 hours 12/23/18 1709     12/23/18 0400  ceFEPIme (MAXIPIME) 2 g in sodium chloride 0.9 % 100 mL IVPB  Status:  Discontinued     2 g 200 mL/hr over 30 Minutes Intravenous Every 12 hours 12/22/18 1444 12/23/18 1709   12/22/18 1600  vancomycin (VANCOCIN) 1,500 mg in sodium chloride 0.9 % 500 mL IVPB  Status:  Discontinued     1,500 mg 250 mL/hr over 120 Minutes Intravenous Every 24 hours 12/22/18 1444 12/23/18 1645   12/22/18 1400  ceFEPIme (MAXIPIME) 2 g in sodium chloride 0.9 % 100 mL IVPB     2 g 200 mL/hr over 30 Minutes Intravenous  Once 12/22/18 1353 12/22/18 1603   12/22/18 1400  vancomycin (VANCOCIN) 1,250 mg in sodium chloride 0.9 % 250 mL IVPB  Status:  Discontinued     1,250 mg 166.7 mL/hr over 90 Minutes Intravenous  Once 12/22/18 1353 12/22/18 1444       Objective: Vitals:   12/23/18 0500 12/23/18 0800 12/23/18 0848 12/23/18 1654  BP:  (!) 70/60 95/70 91/70   Pulse:      Resp:  (!) 21 15   Temp:   97.7 F (36.5 C) 97.9 F (36.6 C)  TempSrc:   Axillary Oral  SpO2:    99%  Weight: 84.2 kg     Height:        Intake/Output Summary (Last 24 hours) at 12/23/2018 2012 Last data filed at 12/23/2018 1655 Gross per 24 hour  Intake 1660 ml  Output 775 ml  Net 885 ml   Filed Weights   12/20/18 2038 12/21/18 0500 12/23/18 0500  Weight: 79.1 kg 81.6 kg 84.2 kg    Examination: General exam: Appears comfortable  HEENT: PERRLA, oral mucosa moist, no sclera icterus or thrush Respiratory system: Clear to auscultation. Respiratory effort normal. Cardiovascular system: S1 & S2 heard, IIRR.   Gastrointestinal system: Abdomen soft, non-tender, nondistended. Normal bowel sounds. Central nervous system: Alert and oriented. No focal neurological deficits. Extremities: No cyanosis, clubbing or edema Skin: both legs are wrapped Psychiatry:  Mood & affect appropriate.     Data  Reviewed: I have personally reviewed following labs and imaging studies  CBC: Recent Labs  Lab 12/19/18 0341  12/21/18 0535 12/22/18 0306 12/22/18 1346 12/22/18 1630 12/23/18 0716  WBC 9.4   < > 11.3* 3.5* 2.4* 3.3* 6.1  NEUTROABS 6.2  --   --   --   --   --  3.7  HGB 10.1*   < > 9.4* 7.7* 7.2* 8.1* 11.4*  HCT 33.7*   < > 29.8* 23.7* 22.8* 25.0* 35.3*  MCV 112.3*   < > 104.9* 103.9* 104.6* 100.4* 100.3*  PLT 298   < > 201 153 132* 118* 104*   < > = values in this interval not displayed.   Basic Metabolic Panel: Recent Labs  Lab 12/19/18 0341 12/20/18 0400 12/21/18 0535 12/22/18 0306 12/23/18 0716  NA 142 139 140 139 140  K 4.5 4.4 4.1 3.7 3.9  CL 110 107 104 105 113*  CO2 24 24 28 23  16*  GLUCOSE 84 90 92 78 174*  BUN 22 17 15 16  27*  CREATININE 0.80 0.68 0.87 0.91 1.12*  CALCIUM 8.7* 8.2* 8.2* 7.5* 7.3*   GFR: Estimated Creatinine Clearance: 42.5 mL/min (A) (by C-G formula based on SCr of 1.12 mg/dL (H)). Liver Function Tests: Recent Labs  Lab 12/23/18 0716  AST 18  ALT 13  ALKPHOS 47  BILITOT 1.6*  PROT 4.9*  ALBUMIN 1.8*   No results for input(s): LIPASE, AMYLASE in the last 168 hours. No results for input(s): AMMONIA in the last 168 hours. Coagulation Profile: Recent Labs  Lab 12/22/18 1346  INR 1.7*   Cardiac Enzymes: No results for input(s): CKTOTAL, CKMB, CKMBINDEX, TROPONINI in the last 168 hours. BNP (last 3 results) No results for input(s): PROBNP in the last 8760 hours. HbA1C: No results for input(s): HGBA1C in the last 72 hours. CBG: Recent Labs  Lab 12/22/18 1416  GLUCAP 101*   Lipid Profile: No results for input(s): CHOL, HDL, LDLCALC, TRIG, CHOLHDL, LDLDIRECT in the last 72 hours. Thyroid Function Tests: No results for input(s): TSH, T4TOTAL, FREET4, T3FREE, THYROIDAB in the last 72 hours. Anemia Panel: No results for input(s): VITAMINB12, FOLATE, FERRITIN, TIBC, IRON, RETICCTPCT in  the last 72 hours. Urine analysis:     Component Value Date/Time   COLORURINE AMBER (A) 12/23/2018 0258   APPEARANCEUR CLOUDY (A) 12/23/2018 0258   LABSPEC 1.032 (H) 12/23/2018 0258   PHURINE 5.0 12/23/2018 0258   GLUCOSEU NEGATIVE 12/23/2018 0258   HGBUR NEGATIVE 12/23/2018 0258   BILIRUBINUR NEGATIVE 12/23/2018 0258   KETONESUR NEGATIVE 12/23/2018 0258   PROTEINUR 30 (A) 12/23/2018 0258   NITRITE NEGATIVE 12/23/2018 0258   LEUKOCYTESUR NEGATIVE 12/23/2018 0258   Sepsis Labs: @LABRCNTIP (procalcitonin:4,lacticidven:4) ) Recent Results (from the past 240 hour(s))  SARS Coronavirus 2 (CEPHEID - Performed in [place] hospital lab), Hosp Order     Status: None   Collection Time: 12/19/18  3:41 AM  Result Value Ref Range Status   SARS Coronavirus 2 NEGATIVE NEGATIVE Final    Comment: (NOTE) If result is NEGATIVE SARS-CoV-2 target nucleic acids are NOT DETECTED. The SARS-CoV-2 RNA is generally detectable in upper and lower  respiratory specimens during the acute phase of infection. The lowest  concentration of SARS-CoV-2 viral copies this assay can detect is 250  copies / mL. A negative result does not preclude SARS-CoV-2 infection  and should not be used as the sole basis for treatment or other  patient management decisions.  A negative result may occur with  improper specimen collection / handling, submission of specimen other  than nasopharyngeal swab, presence of viral mutation(s) within the  areas targeted by this assay, and inadequate number of viral copies  (<250 copies / mL). A negative result must be combined with clinical  observations, patient history, and epidemiological information. If result is POSITIVE SARS-CoV-2 target nucleic acids are DETECTED. The SARS-CoV-2 RNA is generally detectable in upper and lower  respiratory specimens dur ing the acute phase of infection.  Positive  results are indicative of active infection with SARS-CoV-2.  Clinical  correlation with patient history and other diagnostic  information is  necessary to determine patient infection status.  Positive results do  not rule out bacterial infection or co-infection with other viruses. If result is PRESUMPTIVE POSTIVE SARS-CoV-2 nucleic acids MAY BE PRESENT.   A presumptive positive result was obtained on the submitted specimen  and confirmed on repeat testing.  While 2019 novel coronavirus  (SARS-CoV-2) nucleic acids may be present in the submitted sample  additional confirmatory testing may be necessary for epidemiological  and / or clinical management purposes  to differentiate between  SARS-CoV-2 and other Sarbecovirus currently known to infect humans.  If clinically indicated additional testing with an alternate test  methodology [phone]) is advised. The SARS-CoV-2 RNA is generally  detectable in upper and lower respiratory sp ecimens during the acute  phase of infection. The expected result is Negative. Fact Sheet for Patients:  StrictlyIdeas.no Fact Sheet for Healthcare Providers: BankingDealers.co.za This test is not yet approved or cleared by the [place] FDA and has been authorized for detection and/or diagnosis of SARS-CoV-2 by FDA under an Emergency Use Authorization (EUA).  This EUA will remain in effect (meaning this test can be used) for the duration of the COVID-19 declaration under Section 564(b)(1) of the Act, 21 U.S.C. section 360bbb-3(b)(1), unless the authorization is terminated or revoked sooner. Performed at [facility], [place] [address] [place],  [address]   Culture, blood (Routine X 2) w Reflex to ID Panel     Status: None (Preliminary result)   Collection Time: 12/20/18  4:13 PM  Result Value Ref Range Status   Specimen Description  Final    BLOOD RIGHT HAND Performed at [facility], [place] [address], [place], [place] [address]    Special Requests   Final    BOTTLES DRAWN AEROBIC AND  ANAEROBIC Blood Culture adequate volume Performed at [place] [address] [place], [place] [address]    Culture   Final    NO GROWTH 3 DAYS Performed at [facility], [place] [address] [place], [place] [address]    Report Status PENDING  Incomplete  Culture, blood (Routine X 2) w Reflex to ID Panel     Status: None (Preliminary result)   Collection Time: 12/20/18  4:19 PM  Result Value Ref Range Status   Specimen Description   Final    BLOOD RIGHT HAND Performed at [place] [address] [place], [place] [address]    Special Requests   Final    BOTTLES DRAWN AEROBIC ONLY Blood Culture results may not be optimal due to an inadequate volume of blood received in culture bottles Performed at [place] [address], [place], [place] [address]    Culture   Final    NO GROWTH 3 DAYS Performed at [facility], [place] [address], [place], [place] [address]    Report Status PENDING  Incomplete  Culture, blood (single)     Status: None (Preliminary result)   Collection Time: 12/22/18  4:24 PM  Result Value Ref Range Status   Specimen Description BLOOD LEFT HAND  Final   Special Requests   Final    BOTTLES DRAWN AEROBIC ONLY Blood Culture results may not be optimal due to an inadequate volume of blood received in culture bottles   Culture   Final    NO GROWTH < 24 HOURS Performed at [facility], [place] [address] [place], [place] [address]    Report Status PENDING  Incomplete         Radiology Studies: Ct Abdomen Pelvis W Contrast  Result Date: 12/23/2018 CLINICAL DATA:  Blood loss.  Anemia.  Recent stent placement. EXAM: CT ABDOMEN AND PELVIS WITH CONTRAST TECHNIQUE: Multidetector CT imaging of the abdomen and pelvis was performed using the standard protocol following bolus administration of intravenous contrast. CONTRAST:  55mL OMNIPAQUE IOHEXOL 300 MG/ML  SOLN COMPARISON:  CT scan 12/10/2018  FINDINGS: Lower chest: Stable basilar emphysematous changes, pulmonary scarring and numerous scattered calcified granulomas. The heart is mildly enlarged but stable. No pericardial effusion. Stable aortic and coronary artery calcifications. Very small pleural effusions with overlying atelectasis. Hepatobiliary: No focal hepatic lesions or intrahepatic biliary dilatation. Small scattered calcified granulomas are again noted. Gallbladder is grossly normal. No common bile duct dilatation. Pancreas: No mass, inflammation or ductal dilatation. Spleen: Scattered calcified granulomas. Normal size. No focal lesions. Adrenals/Urinary Tract: The adrenal glands are unremarkable. Stable bilateral renal cysts. No worrisome renal lesions or hydronephrosis. Stomach/Bowel: The stomach, duodenum, small bowel and colon are unremarkable. No acute inflammatory changes, mass lesions or obstructive findings. The terminal ileum is normal. The appendix is normal. Vascular/Lymphatic: Advanced atherosclerotic calcifications involving the aorta and iliac arteries. No focal aneurysm or dissection. An IVC filter is noted just below the renal veins. No complicating features. No mesenteric or retroperitoneal mass or adenopathy. Scattered mesenteric and small amount of retroperitoneal fluid. There is also small amount of extraperitoneal pelvic hematoma mainly on the left side. No significant hemorrhage is identified. Reproductive: The uterus and ovaries are unremarkable. Other: Subcutaneous soft tissue  swelling/edema/fluid without discrete hematoma. Small simple appearing cyst is noted near the left hip. Musculoskeletal: The bony structures are intact. There are bilateral hip prostheses without complicating features. Osteoporosis and degenerative lumbar spondylosis with multilevel disc disease and facet disease. Chronic T11 compression fracture. IMPRESSION: 1. Small amount of free abdominal/pelvic fluid but no large mesenteric or retroperitoneal  hematoma. There is a small amount of extraperitoneal hematoma in the left pelvis. No large thigh hematoma. 2. Small bilateral pleural effusions with overlying atelectasis. 3. Calcified granulomas in the liver, spleen and lungs. 4. IVC filter in good position without complicating features. Electronically Signed   By: Marijo Sanes M.D.   On: 12/23/2018 12:31   Dg Chest Port 1 View  Result Date: 12/22/2018 CLINICAL DATA:  Sepsis EXAM: PORTABLE CHEST 1 VIEW COMPARISON:  12/19/2018, 12/01/2018 FINDINGS: Bilateral diffuse mild interstitial thickening likely chronic. Left basilar airspace disease. No pleural effusion or pneumothorax. Stable cardiomediastinal silhouette. No aggressive osseous lesion. IMPRESSION: Left basilar airspace disease which may reflect atelectasis versus pneumonia. Electronically Signed   By: Kathreen Devoid   On: 12/22/2018 14:19      Scheduled Meds: . amiodarone  200 mg Oral Daily  . docusate sodium  100 mg Oral BID  . feeding supplement (ENSURE ENLIVE)  237 mL Oral BID BM  . hydrocortisone sod succinate (SOLU-CORTEF) inj  100 mg Intravenous Q8H  . latanoprost  1 drop Both Eyes QHS  . nystatin   Topical BID  . sodium chloride flush  3 mL Intravenous Q12H   Continuous Infusions: . [START ON 12/24/2018] ceFEPime (MAXIPIME) IV    . lactated ringers 75 mL/hr at 12/23/18 1318  . [START ON 12/24/2018] vancomycin       LOS: 3 days      Berle Mull, MD [facility] Pager: [email or web address] Password [email or web address] 12/23/2018, 8:12 PM

## 2018-12-23 NOTE — Progress Notes (Signed)
Patients urine output is only 150 cc since 1930 PM last night,Patient B/P is ranging from 25O to 03B systolic pressure,patient is alert and oriented no complaint,ongoing blood transfusion.K. Schorr made aware with order to give 250 cc NS.will continue to monitor.

## 2018-12-23 NOTE — Evaluation (Signed)
Physical Therapy Evaluation Patient Details Name: Kelsey [name] MRN: [id number] DOB: 01-21-1932 Today's Date: 12/23/2018   History of Present Illness  85 y.o. female with a hx of PMH of HTN, HLD, pulmonary HTN, atrial fibrillation not on anticoagulation, vocal cord paralysis, chronic combined CHF and chronic prednisone use for unknown reason and recent long admission in 11/2018 with bleeding leg wound, with rapid atrial fibrillation, PE, EF 25-30% on echo.   Clinical Impression  Pt admitted with above diagnosis. Pt currently with functional limitations due to the deficits listed below (see PT Problem List). Pt limited today by fatigue and low BP with activity.  Initial BP was 94/64.  Dropped to 76/54 with sittign EOB 2 min.  Then to 97/44 when pt laying back down. Also pt on 2L on arrival with sat 98%.  Desat with activity to 85% therefore incr to 4L to keep sats >90%.  Pt needs continued therapy to progress to prior functional level. Feel pt may be a rehab candidate. Will follow acutely.   Pt will benefit from skilled PT to increase their independence and safety with mobility to allow discharge to the venue listed below.      Follow Up Recommendations CIR;Supervision/Assistance - 24 hour    Equipment Recommendations  Other (comment)(TBA)    Recommendations for Other Services Rehab consult     Precautions / Restrictions Precautions Precautions: Fall Restrictions Weight Bearing Restrictions: No      Mobility  Bed Mobility Overal bed mobility: Needs Assistance Bed Mobility: Rolling;Sidelying to Sit Rolling: Mod assist Sidelying to sit: Mod assist;Max assist       General bed mobility comments: Needed incr assist to come to EOB with pt needing to pull up on therapist  Transfers                 General transfer comment: unable to stand  Ambulation/Gait                Stairs            Wheelchair Mobility    Modified Rankin (Stroke Patients  Only)       Balance Overall balance assessment: Needs assistance Sitting-balance support: Bilateral upper extremity supported;Feet unsupported Sitting balance-Leahy Scale: Zero Sitting balance - Comments: could not sit without max to total assist losing balance posteiorly and to hefr right.  Sat a total of 2 min and had to lay back down as BP dropped.        Standing balance comment: unable                             Pertinent Vitals/Pain Pain Assessment: Faces Faces Pain Scale: Hurts whole lot Pain Location: bil LES, generalized Pain Descriptors / Indicators: Aching;Grimacing;Guarding Pain Intervention(s): Limited activity within patient's tolerance;Monitored during session;Premedicated before session;Repositioned    Home Living Family/patient expects to be discharged to:: Private residence Living Arrangements: Spouse/significant other Available Help at Discharge: Family;Available 24 hours/day Type of Home: House Home Access: Stairs to enter(in the process of getting a chair step) Entrance Stairs-Rails: Left Entrance Stairs-Number of Steps: 5 Home Layout: One level Home Equipment: Bedside commode;Walker - 2 wheels      Prior Function Level of Independence: Needs assistance   Gait / Transfers Assistance Needed: intermittent use of RW  ADL's / Homemaking Assistance Needed: husband assist with household tasks        Hand Dominance        Extremity/Trunk Assessment  Upper Extremity Assessment Upper Extremity Assessment: Defer to OT evaluation    Lower Extremity Assessment Lower Extremity Assessment: RLE deficits/detail;LLE deficits/detail RLE Deficits / Details: grossly 3-/5 LLE Deficits / Details: grossly 3-/5    Cervical / Trunk Assessment Cervical / Trunk Assessment: Kyphotic  Communication   Communication: No difficulties  Cognition Arousal/Alertness: Awake/alert Behavior During Therapy: WFL for tasks assessed/performed Overall Cognitive  Status: Within Functional Limits for tasks assessed                                        General Comments      Exercises     Assessment/Plan    PT Assessment Patient needs continued PT services  PT Problem List Decreased strength;Decreased activity tolerance;Decreased balance;Decreased mobility;Decreased knowledge of use of DME;Decreased safety awareness       PT Treatment Interventions DME instruction;Gait training;Stair training;Functional mobility training;Therapeutic activities;Therapeutic exercise;Balance training;Patient/family education    PT Goals (Current goals can be found in the Care Plan section)  Acute Rehab PT Goals Patient Stated Goal: return home soon PT Goal Formulation: With patient Time For Goal Achievement: 01/06/19 Potential to Achieve Goals: Fair    Frequency Min 3X/week   Barriers to discharge        Co-evaluation               AM-PAC PT "6 Clicks" Mobility  Outcome Measure Help needed turning from your back to your side while in a flat bed without using bedrails?: Total Help needed moving from lying on your back to sitting on the side of a flat bed without using bedrails?: Total Help needed moving to and from a bed to a chair (including a wheelchair)?: Total Help needed standing up from a chair using your arms (e.g., wheelchair or bedside chair)?: Total Help needed to walk in hospital room?: Total Help needed climbing 3-5 steps with a railing? : Total 6 Click Score: 6    End of Session Equipment Utilized During Treatment: Gait belt;Oxygen Activity Tolerance: Patient limited by fatigue;Patient limited by pain Patient left: with call bell/phone within reach;in bed;with bed alarm set Nurse Communication: Mobility status;Need for lift equipment PT Visit Diagnosis: Unsteadiness on feet (R26.81);Pain;Muscle weakness (generalized) (M62.81);History of falling (Z91.81) Pain - Right/Left: (bil) Pain - part of body: Leg;Ankle and  joints of foot;Knee    Time: 9702-6378 PT Time Calculation (min) (ACUTE ONLY): 14 min   Charges:   PT Evaluation $PT Eval Moderate Complexity: [place] Pager:  [phone]  Office:  [place] 12/23/2018, 2:02 PM

## 2018-12-23 NOTE — Progress Notes (Signed)
Patient still has 150 cc urine output after 2 units PRBC,and NS at 100 cc per/hour,bladder scan showed 209 cc.K.Schoorr made aware with oreder to do in and out cath for bladder scan equal and above 350 cc.will continue to monitor.

## 2018-12-23 NOTE — Progress Notes (Addendum)
Progress Note  Patient Name: Kelsey [name] Date of Encounter: 12/23/2018  Primary Cardiologist: Mahala Menghini, MD   Subjective   Appears to be mildly confused. IV fluids infusing at 100 ml/hr. She denies shortness of breath although she does seem to breathing with more effort than normal. She denies chest pain or any other pain but does have tenderness when touched of the left leg. Left medial knee area with mild redness. Bilateral Unna boots in place.   Inpatient Medications    Scheduled Meds: . amiodarone  200 mg Oral Daily  . docusate sodium  100 mg Oral BID  . feeding supplement (ENSURE ENLIVE)  237 mL Oral BID BM  . hydrocortisone sod succinate (SOLU-CORTEF) inj  100 mg Intravenous Q8H  . latanoprost  1 drop Both Eyes QHS  . nystatin   Topical BID  . sodium chloride flush  3 mL Intravenous Q12H   Continuous Infusions: . sodium chloride 100 mL/hr at 12/23/18 0428  . ceFEPime (MAXIPIME) IV Stopped (12/23/18 0515)  . vancomycin 1,500 mg (12/22/18 1745)   PRN Meds: acetaminophen **OR** acetaminophen, ondansetron (ZOFRAN) IV, oxyCODONE, polyethylene glycol, sodium chloride flush   Vital Signs    Vitals:   12/23/18 0218 12/23/18 0500 12/23/18 0800 12/23/18 0848  BP:   (!) 70/60 95/70  Pulse: (!) 104     Resp:   (!) 21 15  Temp: 99 F (37.2 C)   97.7 F (36.5 C)  TempSrc: Oral   Axillary  SpO2:      Weight:  84.2 kg    Height:        Intake/Output Summary (Last 24 hours) at 12/23/2018 0945 Last data filed at 12/23/2018 0730 Gross per 24 hour  Intake 1660 ml  Output 350 ml  Net 1310 ml   Last 3 Weights 12/23/2018 12/21/2018 12/20/2018  Weight (lbs) 185 lb 10 oz 179 lb 14.3 oz 174 lb 6.1 oz  Weight (kg) 84.2 kg 81.6 kg 79.1 kg      Telemetry    Atrial fibrillation ~100 bpm, occ PVCs - Personally Reviewed  ECG    No new tracings - Personally Reviewed  Physical Exam   GEN: No acute distress.   Neck: No JVD Cardiac: Irregularly irregular rhythm, no  murmurs, rubs, or gallops.  Respiratory: Clear to auscultation bilaterally. GI: Soft, nontender, non-distended  MS: Right arm edema; Unna boots bilaterally. Left left with mild swelling and mild redness to medial knee area Neuro:  Mildly confused and distracted Psych: Normal affect   Labs    Chemistry Recent Labs  Lab 12/21/18 0535 12/22/18 0306 12/23/18 0716  NA 140 139 140  K 4.1 3.7 3.9  CL 104 105 113*  CO2 28 23 16*  GLUCOSE 92 78 174*  BUN 15 16 27*  CREATININE 0.87 0.91 1.12*  CALCIUM 8.2* 7.5* 7.3*  PROT  --   --  4.9*  ALBUMIN  --   --  1.8*  AST  --   --  18  ALT  --   --  13  ALKPHOS  --   --  47  BILITOT  --   --  1.6*  GFRNONAA >60 58* 45*  GFRAA >60 >60 52*  ANIONGAP 8 11 11      Hematology Recent Labs  Lab 12/22/18 1346 12/22/18 1630 12/23/18 0716  WBC 2.4* 3.3* 6.1  RBC 2.18* 2.49* 3.52*  HGB 7.2* 8.1* 11.4*  HCT 22.8* 25.0* 35.3*  MCV 104.6* 100.4* 100.3[facility]  33.0 32.5 32.4  MCHC 31.6 32.4 32.3  RDW 16.3* 17.6* 17.6*  PLT 132* 118* 104*    Cardiac EnzymesNo results for input(s): TROPONINI in the last 168 hours. No results for input(s): TROPIPOC in the last 168 hours.   BNP Recent Labs  Lab 12/19/18 0341  BNP 486.5*     DDimer No results for input(s): DDIMER in the last 168 hours.   Radiology    Dg Chest Port 1 View  Result Date: 12/22/2018 CLINICAL DATA:  Sepsis EXAM: PORTABLE CHEST 1 VIEW COMPARISON:  12/19/2018, 12/01/2018 FINDINGS: Bilateral diffuse mild interstitial thickening likely chronic. Left basilar airspace disease. No pleural effusion or pneumothorax. Stable cardiomediastinal silhouette. No aggressive osseous lesion. IMPRESSION: Left basilar airspace disease which may reflect atelectasis versus pneumonia. Electronically Signed   By: Kathreen Devoid   On: 12/22/2018 14:19    Cardiac Studies   Echo 12/02/2018 IMPRESSIONS  1. The left ventricle has severely reduced systolic function, with an ejection fraction of 25-30%.  The cavity size was normal. There is mildly increased left ventricular wall thickness. Left ventricular diastolic function could not be evaluated  secondary to atrial fibrillation. Left ventricular diffuse hypokinesis. 2. The right ventricle has moderately reduced systolic function. The cavity was mildly enlarged. There is no increase in right ventricular wall thickness. 3. Left atrial size was severely dilated. 4. Right atrial size was moderately dilated. 5. The mitral valve is abnormal. Mild thickening of the mitral valve leaflet. There is mild to moderate mitral annular calcification present. 6. The tricuspid valve was abnormal. Tricuspid valve regurgitation is mild-moderate. 7. The aortic valve is abnormal Mild calcification of the aortic valve. No stenosis of the aortic valve. 8. The aortic root and ascending aorta are normal in size and structure. 9. The interatrial septum was not well visualized.   Patient Profile     85 y.o. female with a hx of PMH of HTN, HLD, pulmonary HTN, atrial fibrillation not on anticoagulation, vocal cord paralysis, chronic combined CHF and chronic prednisone use for unknown reason and recent long admission in 11/2018 with bleeding leg wound, with rapid atrial fibrillation, PE, EF 25-30% on echo.   Assessment & Plan    1. Persistent atrial fibrillation -Past failed cardioversion and amiodarone was discontinued due to tremor and lack of HR response. However, patient placed on amiodarone 200 mg daily during last admission due to elevated rate with good response and plan to discontinue eventually after seen by primary cardiologist as outpatient. -She continues on amiodarone 200 mg daily. Coreg 12.5 mg was tried but discontinued due to hypotension. -Due to hypotension cannot tolerate any other rate control medications. Continue amiodarone. -Rate fairly well controlled around 100 bpm.  -Pt self discontinued Xarelto for splotches on hand. Also recently had  leg hematoma without known injury. Anti-coagulation being held.   2. Hemorrhagic shock -BP dropped to the 50's, HR up to 120's, fever.  -Per discussion between primary and CCM, pt not felt to be a good candidate for any aggressive intervention. Already a DNR/DNI. Prognosis considred poor and decision made not to use pressors. She was given emergency transfusion and had significant improvement in mentation although BP remains low.  -Today SBP in 70's -90's. 70/60 at 8am, 95/70 at 8:48. Improved with IV fluids. Watch for volume overload with decreased EF.  -No longer a candidate for any type of anticoagulation or antiplatelet medications.  -Pt being treated for possible sepsis, IV antibiotics.  -palliative care has been requested.   3. Systolic  heart failure -Volume status stable. Watch for volume overload with administration of blood products and IV fluids.  - Echo 11/2018 with  new cardiomyopathy with LVEF 25-30%, global hypokinesis and at least moderate RV systolic dysfunction as well as moderate to severe biatrial enlargement.  -BP is too soft to support any HF meds at this time.   4. Pulmonary emboli with pulmonary infarction -S/P IVC filter -Heparin discontinued due to anemia with concern for bleeding.   5. S/P R SFA stent -Pt was given Plavix yesterday but now is discontinued due to drop in hemoglobin and hypotension.   6. Left groin hematoma -Seen by vascular surgery, no hematoma, superficial left flank hematoma but no enough to cause hemorrhagic shock.   7. Acute anemia -Pt with acute blood loss anemia causing hemorrhagic shock. Has received at least 3 units of blood.  -Hgb up to 11.4 this am.       For questions or updates, please contact Manuel Garcia Please consult [email or web address] for contact info under        Signed, Daune Perch, NP  12/23/2018, 9:45 AM    Patient is out of the room at CT.  Chart reviewed as above.  Little to offer from a cardiac standpoint with  multiple acute and chronic issues and limitations with bleeding and possible sepsis.  Agree that palliative care is appropriate.  We can follow from a distance but no acute suggestions at this point.  Jeneen Rinks Viktoriya Glaspy  12:01 PM  12/23/2018

## 2018-12-23 NOTE — Progress Notes (Addendum)
Pharmacy Antibiotic Note  Kelsey [name] is a 85 y.o. female admitted on 12/19/2018 found to have pulmonary infarct/resp failure. Remains febrile. Planning to initiate broad spectrum antibiotics with undetermined source at this time.  Scr increased to 1.12, CrCl ~42 ml/min.  I will reduce vancomycin and cefepime doses.   Vancomycin to 1500 mg IV Q 36 hrs. Goal AUC 400-550. Expected AUC: 462 SCr used: 1.12  Plan: -Adjust Vancomycin to 1500 mg IV q36h -Cefepime reduce to 2g q24h (crcl 42 ml/min)  -Monitor renal fx, cultures, vancomycin levels as needed   Height: 5\' 8"  (172.7 cm) Weight: 185 lb 10 oz (84.2 kg) IBW/kg (Calculated) : 63.9  Temp (24hrs), Avg:98.5 F (36.9 C), Min:97.7 F (36.5 C), Max:99 F (37.2 C)  Recent Labs  Lab 12/19/18 0341 12/20/18 0400 12/21/18 0535 12/22/18 0306 12/22/18 1346 12/22/18 1401 12/22/18 1624 12/22/18 1630 12/23/18 0716  WBC 9.4 11.5* 11.3* 3.5* 2.4*  --   --  3.3* 6.1  CREATININE 0.80 0.68 0.87 0.91  --   --   --   --  1.12*  LATICACIDVEN  --   --   --   --   --  2.4* 2.9*  --   --      Antimicrobials this admission: 6/3 vancomycin > 6/3 cefepime >  Dose adjustments this admission: 6/4  Reduced vanc from 1500 mg q24h to 1500 mg q36 (scr increased) 6/4 Reduced cefepime from 2g q12h to 2g q24h  Microbiology results: 6/1 BCx x2: ngtd 6/3 BCx x1 : ngtd 6/3 Covid-19: neg   Nicole Cella, RPh Clinical Pharmacist Please check AMION for all [place] phone numbers After 10:00 PM, call [place] [phone] 12/23/2018 4:47 PM

## 2018-12-23 NOTE — Progress Notes (Signed)
Orthopedic Tech Progress Note Patient Details:  Dorothy Polhemus [facility] [birth year] [id number]  Ortho Devices Type of Ortho Device: Haematologist Ortho Device/Splint Location: Bilateral unna boots Ortho Device/Splint Interventions: Application   Post Interventions Patient Tolerated: Well Instructions Provided: Care of device   [place] Pink 12/23/2018, 1:42 PM

## 2018-12-24 ENCOUNTER — Inpatient Hospital Stay (HOSPITAL_COMMUNITY): Payer: Medicare HMO

## 2018-12-24 DIAGNOSIS — Z7189 Other specified counseling: Secondary | ICD-10-CM

## 2018-12-24 DIAGNOSIS — R609 Edema, unspecified: Secondary | ICD-10-CM

## 2018-12-24 DIAGNOSIS — Z515 Encounter for palliative care: Secondary | ICD-10-CM

## 2018-12-24 LAB — CBC WITH DIFFERENTIAL/PLATELET
Abs Immature Granulocytes: 0.11 10*3/uL — ABNORMAL HIGH (ref 0.00–0.07)
Basophils Absolute: 0.1 10*3/uL (ref 0.0–0.1)
Basophils Relative: 1 %
Eosinophils Absolute: 0 10*3/uL (ref 0.0–0.5)
Eosinophils Relative: 0 %
HCT: 33.6 % — ABNORMAL LOW (ref 36.0–46.0)
Hemoglobin: 10.9 g/dL — ABNORMAL LOW (ref 12.0–15.0)
Immature Granulocytes: 1 %
Lymphocytes Relative: 4 %
Lymphs Abs: 0.3 10*3/uL — ABNORMAL LOW (ref 0.7–4.0)
MCH: 31.6 pg (ref 26.0–34.0)
MCHC: 32.4 g/dL (ref 30.0–36.0)
MCV: 97.4 fL (ref 80.0–100.0)
Monocytes Absolute: 0.4 10*3/uL (ref 0.1–1.0)
Monocytes Relative: 5 %
Neutro Abs: 7.9 10*3/uL — ABNORMAL HIGH (ref 1.7–7.7)
Neutrophils Relative %: 89 %
Platelets: 93 10*3/uL — ABNORMAL LOW (ref 150–400)
RBC: 3.45 MIL/uL — ABNORMAL LOW (ref 3.87–5.11)
RDW: 17.6 % — ABNORMAL HIGH (ref 11.5–15.5)
WBC: 8.8 10*3/uL (ref 4.0–10.5)
nRBC: 0 % (ref 0.0–0.2)

## 2018-12-24 LAB — COMPREHENSIVE METABOLIC PANEL
ALT: 11 U/L (ref 0–44)
AST: 15 U/L (ref 15–41)
Albumin: 1.7 g/dL — ABNORMAL LOW (ref 3.5–5.0)
Alkaline Phosphatase: 59 U/L (ref 38–126)
Anion gap: 11 (ref 5–15)
BUN: 36 mg/dL — ABNORMAL HIGH (ref 8–23)
CO2: 19 mmol/L — ABNORMAL LOW (ref 22–32)
Calcium: 7.9 mg/dL — ABNORMAL LOW (ref 8.9–10.3)
Chloride: 107 mmol/L (ref 98–111)
Creatinine, Ser: 0.99 mg/dL (ref 0.44–1.00)
GFR calc Af Amer: >60 mL/min (ref >60)
GFR calc non Af Amer: 52 mL/min — ABNORMAL LOW (ref >60)
Glucose, Bld: 127 mg/dL — ABNORMAL HIGH (ref 70–99)
Potassium: 3.9 mmol/L (ref 3.5–5.1)
Sodium: 137 mmol/L (ref 135–145)
Total Bilirubin: 1.2 mg/dL (ref 0.3–1.2)
Total Protein: 4.9 g/dL — ABNORMAL LOW (ref 6.5–8.1)

## 2018-12-24 LAB — PROCALCITONIN: Procalcitonin: 30.8 ng/mL

## 2018-12-24 LAB — LACTIC ACID, PLASMA: Lactic Acid, Venous: 2.1 mmol/L (ref 0.5–1.9)

## 2018-12-24 NOTE — Progress Notes (Signed)
Palliative note:   Patient stable today. C/O pain in arm- doppler does not indicate clot.  Discussed patient's status and possible trajectory over phone. While she is stable now- we discussed her continued hypotension, the inability to anticoagulate her chronic PE, her lung which has infarcted, her overall decline in the last month. We discussed that the current interventions of IV antibiotics and IV fluids and cardiac monitoring and ongoing hospitalization may not be improving her functional status and my lead to quickly repeating hospitalizations. Kelsey [name] was receptive to information and asked appropriate questions.  Discussed options of comfort care, continued aggressive care, stabilizing and going home with hospice.  PMT meeting tomorrow at 2pm with Palliative Care provider so spouse and patient can make decisions together.   Kelsey [name], AGNP-C Palliative Medicine  Please call Palliative Medicine team phone with any questions [phone]. For individual providers please see AMION.  Time In: 1000 Time Out:1035 Total Time:35 minutes  Greater than 50%  of this time was spent counseling and coordinating care related to the above assessment and plan

## 2018-12-24 NOTE — Progress Notes (Signed)
Right and left lower extremity wound dressing changes done per MD order to Wound Care nurse's specifications.  Per MD, Louretta Parma boots not replaced at this time.  Will continue to monitor.

## 2018-12-24 NOTE — Progress Notes (Signed)
CRITICAL VALUE ALERT  Critical Value:  Lactic acid 2.1  Date & Time Notied:  12/24/18, 1045  Provider Notified: Dr. Posey Pronto  Orders Received/Actions taken: new orders implemented as necessary

## 2018-12-24 NOTE — Progress Notes (Signed)
RUE venous duplex       has been completed. Preliminary results can be found under CV proc through chart review. Tekoa Hamor, BS, RDMS, RVT   

## 2018-12-24 NOTE — Consult Note (Signed)
[place] Nurse wound consult note Patient receiving care in [place]. I spoke with Dr. Marlowe Sax via telephone regarding the consult for a large open wound on a LE due to ruptured hematoma. Overlying skin is missing. Apparently, the patient had a wrapping of some type on the LE, which was removed.  See photo in the record.  It appears there is a portion of congealed blood remaining, and overlying skin is missing.  I have explained to Dr. Posey Pronto that the management of this wound exceeds the scope of the [facility] nurse and the patient would be best served by a surgical consult. Dr. Posey Pronto will consult surgery for further care needs. Thank you for the consult.  Discussed plan of care with the physician.  [place] nurse will not follow at this time.  Please re-consult the [place] team if needed.  Val Riles, RN, MSN, CWOCN, CNS-BC, pager [phone]

## 2018-12-24 NOTE — Progress Notes (Signed)
PROGRESS NOTE    Kelsey [name]   JHE:[id number]  DOB: [birth year]  DOA: 12/19/2018 PCP: [name], MD   Brief Narrative:  Kelsey [name] is an 85 y/o with spinal stenosis (moslty sedentary) chronic atrial fibrillation off of Xarelto due to "blotches on her hand", PVD,  breast implants, chronic systolic heart failure, pulmonary hypertension, hyperlipidemia, vocal cord paralysis, septic shock, AKI and incidental PT on last admission. She also was found on 5/19  to have a right calf hematoma without any known injury. She was on full dose Lovenox in the hospital and it was discontinued after this hematoma was found. She was later found to have a PE when a CT of the abd/pelvis. PE was in the RL base and was associated with a right subsegmental infarct. She had no symptoms and no DVT was found on venous duplex.  She returns to [facility] shortness of breath and is found to be hypoxic to 85% on RA and required 4 L. CTA Chest>  Segmental/subsegmental right lower lobe pulmonary emboli, as above. Overall clot burden is mild. Transferred to [facility] for IVC filter.   Subjective: Reports severe pain on minimal touching of her right leg as well as right upper extremity.  No nausea no vomiting.  Noticed bleeding on the dressing my examination.  Assessment & Plan:   Principal Problem:   Pulmonary emboli with pulmonary infarct/ acute respiratory failure - unable to anticoagulate due to calf hematomas bilaterally - she has received an IVC filter by vascular surgery and had an Aortogram with b/l runoff due to PVD- see below Postprocedure patient was started on IV heparin. Hemoglobin dropped to 7.7 with hypotension. I suspect that this is a significant contraindication for the patient for anticoagulation. Discontinue all anticoagulation as well as antiplatelet medication.  Shock. Hemorrhagic versus septic. Blood pressure dropped to 50s.  Heart rate 120s. Patient went into  A. fib with RVR. Patient also was febrile. Suspect this is actually more likely hemorrhagic shock again septic shock. CT abdomen pelvis performed on 12/23/2018 shows no evidence of significant hematoma.  Mild groin hematoma which does not explain hemorrhagic soft as a sole etiology although patient did respond to massive blood transfusion.  Discussed with CCM patient not a great candidate for any aggressive intervention patient already DNR/DNI.  Prognosis is significantly poor and therefore recommending no pressors. Patient was given emergent transfusion after receiving consent from her husband as patient was having a life-threatening emergency. After the blood transfusion patient showed significant improvement in her mentation although blood pressure continues to remain low. Patient received total of 3 PRBC transfusion with improvement in hemoglobin up to 11. Remains hypotensive for now. Continue Solu-Cortef.  Continue IV antibiotics.   Goals of care discussion. Not a great situation here. Patient has a history of A. fib as well as PE but not a great candidate for anticoagulation. Any attempt of anticoagulation has resulted into worsening hematoma as well as bleeding. Currently I am suspecting that she is actually suffering from retroperitoneal hematoma or a groin hematoma or rectus sheath hematoma. CT abdomen and pelvis is ordered but currently unstable for performing that evaluation. Patient has been 3 PRBC with improvement in hemoglobin but no improvement in her blood pressure. Urine output worsening.  Renal function worsening.  Liver function worsening. Currently her goal is to continue to treat what is treatable. I recommended her to consider option of comfort care and palliative care as the patient's prognosis is poor. Palliative care will  follow up with the patient tomorrow.   A. fib with RVR. Acute diastolic dysfunction. Appreciate cardiology assistance. Currently appears her  cardiac problems are secondary to other medical etiologies.  Will monitor.  Calf hematoma and severe PVD - apparently was a spontaneous hematoma while on anticoagulation in hospital (full dose Lovenox) noted on 5/19 - Right SFA stent should allow for increased perfusion to let wound heal - vascular surgery has started ASA and Plavix -Discontinue Unna boots. Not a great candidate for debridement but if the patient wants to continue aggressive care and vascular surgery would consider debridement if it does not get better by Monday.  Fever 102.4 on 5/31 Septic work-up initiated. Follow-up on cultures.  So far negative Currently on IV vancomycin and cefepime.    Hypertension,   Chronic systolic CHF (EF 96-75%),  Pulmonary HTN  -Hold Coreg, lasix    Chronic atrial fibrillation with RVR  -Hold Coreg, Amiodarone- tachycardic post op   - avoid AC - she was previously on Xarelto  -adding IV Lopressor PRN   Chronic back pain Chronic steroids. Patient is on chronic prednisone 50 mg daily. We will provide stress dose steroids.  Time spent in minutes: 35 minutes   DVT prophylaxis: Contraindicated for now.  Bilateral calf hematoma Code Status: DNR Family Communication: Discussed with Kelsey [name]. Due to patient's life-threatening emergency request of husband to be at bedside.  Received permission from [facility].  Disposition Plan: will need PT eval Consultants:  Vascular surgery Cardiology Palliative care  Procedures:   6/2 1.  Ultrasound-guided access, left femoral artery             2.  Abdominal aortogram             3.  Bilateral lower extremity runoff             4.  Stent, right superficial femoral artery             5.  Closure device (Mynx             6.  Ultrasound-guided access, right femoral vein             7.  Inferior venacavogram                    8.  Placement of inferior vena cava filter             9.  Conscious sedation (87 minutes) Antimicrobials:  Anti-infectives  (From admission, onward)   Start     Dose/Rate Route Frequency Ordered Stop   12/24/18 0600  vancomycin (VANCOCIN) 1,500 mg in sodium chloride 0.9 % 500 mL IVPB     1,500 mg 250 mL/hr over 120 Minutes Intravenous Every 36 hours 12/23/18 1645     12/24/18 0600  ceFEPIme (MAXIPIME) 2 g in sodium chloride 0.9 % 100 mL IVPB     2 g 200 mL/hr over 30 Minutes Intravenous Every 24 hours 12/23/18 1709     12/23/18 0400  ceFEPIme (MAXIPIME) 2 g in sodium chloride 0.9 % 100 mL IVPB  Status:  Discontinued     2 g 200 mL/hr over 30 Minutes Intravenous Every 12 hours 12/22/18 1444 12/23/18 1709   12/22/18 1600  vancomycin (VANCOCIN) 1,500 mg in sodium chloride 0.9 % 500 mL IVPB  Status:  Discontinued     1,500 mg 250 mL/hr over 120 Minutes Intravenous Every 24 hours 12/22/18 1444 12/23/18 1645   12/22/18 1400  ceFEPIme (MAXIPIME) 2 g in  sodium chloride 0.9 % 100 mL IVPB     2 g 200 mL/hr over 30 Minutes Intravenous  Once 12/22/18 1353 12/22/18 1603   12/22/18 1400  vancomycin (VANCOCIN) 1,250 mg in sodium chloride 0.9 % 250 mL IVPB  Status:  Discontinued     1,250 mg 166.7 mL/hr over 90 Minutes Intravenous  Once 12/22/18 1353 12/22/18 1444       Objective: Vitals:   12/24/18 0800 12/24/18 1337 12/24/18 1905 12/24/18 2006  BP: (!) 88/60 110/66 (!) 106/92 102/75  Pulse: (!) 102 (!) 113 (!) 104 (!) 111  Resp: 20 20 18 20   Temp: 98.5 F (36.9 C) 97.8 F (36.6 C) 97.7 F (36.5 C) 97.9 F (36.6 C)  TempSrc: Oral Oral Oral Oral  SpO2: 94% 100% 100% 96%  Weight:      Height:        Intake/Output Summary (Last 24 hours) at 12/24/2018 2046 Last data filed at 12/24/2018 1503 Gross per 24 hour  Intake 2626.11 ml  Output 200 ml  Net 2426.11 ml   Filed Weights   12/21/18 0500 12/23/18 0500 12/24/18 0359  Weight: 81.6 kg 84.2 kg 86.9 kg    Examination: General exam: Appears comfortable  HEENT: PERRLA, oral mucosa moist, no sclera icterus or thrush Respiratory system: Clear to auscultation.  Respiratory effort normal. Cardiovascular system: S1 & S2 heard, IIRR.   Gastrointestinal system: Abdomen soft, non-tender, nondistended. Normal bowel sounds. Central nervous system: Alert and oriented. No focal neurological deficits. Extremities: No cyanosis, clubbing or edema Skin: both legs are wrapped Psychiatry:  Mood & affect appropriate.     Data Reviewed: I have personally reviewed following labs and imaging studies  CBC: Recent Labs  Lab 12/19/18 0341  12/22/18 0306 12/22/18 1346 12/22/18 1630 12/23/18 0716 12/24/18 0723  WBC 9.4   < > 3.5* 2.4* 3.3* 6.1 8.8  NEUTROABS 6.2  --   --   --   --  3.7 7.9*  HGB 10.1*   < > 7.7* 7.2* 8.1* 11.4* 10.9*  HCT 33.7*   < > 23.7* 22.8* 25.0* 35.3* 33.6*  MCV 112.3*   < > 103.9* 104.6* 100.4* 100.3* 97.4  PLT 298   < > 153 132* 118* 104* 93*   < > = values in this interval not displayed.   Basic Metabolic Panel: Recent Labs  Lab 12/20/18 0400 12/21/18 0535 12/22/18 0306 12/23/18 0716 12/24/18 0723  NA 139 140 139 140 137  K 4.4 4.1 3.7 3.9 3.9  CL 107 104 105 113* 107  CO2 24 28 23  16* 19*  GLUCOSE 90 92 78 174* 127*  BUN 17 15 16  27* 36*  CREATININE 0.68 0.87 0.91 1.12* 0.99  CALCIUM 8.2* 8.2* 7.5* 7.3* 7.9*   GFR: Estimated Creatinine Clearance: 48.8 mL/min (by C-G formula based on SCr of 0.99 mg/dL). Liver Function Tests: Recent Labs  Lab 12/23/18 0716 12/24/18 0723  AST 18 15  ALT 13 11  ALKPHOS 47 59  BILITOT 1.6* 1.2  PROT 4.9* 4.9*  ALBUMIN 1.8* 1.7*   No results for input(s): LIPASE, AMYLASE in the last 168 hours. No results for input(s): AMMONIA in the last 168 hours. Coagulation Profile: Recent Labs  Lab 12/22/18 1346  INR 1.7*   Cardiac Enzymes: No results for input(s): CKTOTAL, CKMB, CKMBINDEX, TROPONINI in the last 168 hours. BNP (last 3 results) No results for input(s): PROBNP in the last 8760 hours. HbA1C: No results for input(s): HGBA1C in the last 72 hours. CBG:  Recent Labs  Lab  12/22/18 1416  GLUCAP 101*   Lipid Profile: No results for input(s): CHOL, HDL, LDLCALC, TRIG, CHOLHDL, LDLDIRECT in the last 72 hours. Thyroid Function Tests: No results for input(s): TSH, T4TOTAL, FREET4, T3FREE, THYROIDAB in the last 72 hours. Anemia Panel: No results for input(s): VITAMINB12, FOLATE, FERRITIN, TIBC, IRON, RETICCTPCT in the last 72 hours. Urine analysis:    Component Value Date/Time   COLORURINE AMBER (A) 12/23/2018 0258   APPEARANCEUR CLOUDY (A) 12/23/2018 0258   LABSPEC 1.032 (H) 12/23/2018 0258   PHURINE 5.0 12/23/2018 0258   GLUCOSEU NEGATIVE 12/23/2018 0258   HGBUR NEGATIVE 12/23/2018 0258   BILIRUBINUR NEGATIVE 12/23/2018 0258   KETONESUR NEGATIVE 12/23/2018 0258   PROTEINUR 30 (A) 12/23/2018 0258   NITRITE NEGATIVE 12/23/2018 0258   LEUKOCYTESUR NEGATIVE 12/23/2018 0258   Sepsis Labs: @LABRCNTIP (procalcitonin:4,lacticidven:4) ) Recent Results (from the past 240 hour(s))  SARS Coronavirus 2 (CEPHEID - Performed in [place] hospital lab), Hosp Order     Status: None   Collection Time: 12/19/18  3:41 AM  Result Value Ref Range Status   SARS Coronavirus 2 NEGATIVE NEGATIVE Final    Comment: (NOTE) If result is NEGATIVE SARS-CoV-2 target nucleic acids are NOT DETECTED. The SARS-CoV-2 RNA is generally detectable in upper and lower  respiratory specimens during the acute phase of infection. The lowest  concentration of SARS-CoV-2 viral copies this assay can detect is 250  copies / mL. A negative result does not preclude SARS-CoV-2 infection  and should not be used as the sole basis for treatment or other  patient management decisions.  A negative result may occur with  improper specimen collection / handling, submission of specimen other  than nasopharyngeal swab, presence of viral mutation(s) within the  areas targeted by this assay, and inadequate number of viral copies  (<250 copies / mL). A negative result must be combined with clinical    observations, patient history, and epidemiological information. If result is POSITIVE SARS-CoV-2 target nucleic acids are DETECTED. The SARS-CoV-2 RNA is generally detectable in upper and lower  respiratory specimens dur ing the acute phase of infection.  Positive  results are indicative of active infection with SARS-CoV-2.  Clinical  correlation with patient history and other diagnostic information is  necessary to determine patient infection status.  Positive results do  not rule out bacterial infection or co-infection with other viruses. If result is PRESUMPTIVE POSTIVE SARS-CoV-2 nucleic acids MAY BE PRESENT.   A presumptive positive result was obtained on the submitted specimen  and confirmed on repeat testing.  While 2019 novel coronavirus  (SARS-CoV-2) nucleic acids may be present in the submitted sample  additional confirmatory testing may be necessary for epidemiological  and / or clinical management purposes  to differentiate between  SARS-CoV-2 and other Sarbecovirus currently known to infect humans.  If clinically indicated additional testing with an alternate test  methodology [phone]) is advised. The SARS-CoV-2 RNA is generally  detectable in upper and lower respiratory sp ecimens during the acute  phase of infection. The expected result is Negative. Fact Sheet for Patients:  StrictlyIdeas.no Fact Sheet for Healthcare Providers: BankingDealers.co.za This test is not yet approved or cleared by the [place] FDA and has been authorized for detection and/or diagnosis of SARS-CoV-2 by FDA under an Emergency Use Authorization (EUA).  This EUA will remain in effect (meaning this test can be used) for the duration of the COVID-19 declaration under Section 564(b)(1) of the Act, 21 U.S.C. section 360bbb-3(b)(1),  unless the authorization is terminated or revoked sooner. Performed at [facility], [place]  [address], [place], [place] [address]   Culture, blood (Routine X 2) w Reflex to ID Panel     Status: None (Preliminary result)   Collection Time: 12/20/18  4:13 PM  Result Value Ref Range Status   Specimen Description   Final    BLOOD RIGHT HAND Performed at [place] [address] [place], [place] [address]    Special Requests   Final    BOTTLES DRAWN AEROBIC AND ANAEROBIC Blood Culture adequate volume Performed at [place] [address], [place], [place] [address]    Culture   Final    NO GROWTH 4 DAYS Performed at [facility], [place] [address], [place], [place] [address]    Report Status PENDING  Incomplete  Culture, blood (Routine X 2) w Reflex to ID Panel     Status: None (Preliminary result)   Collection Time: 12/20/18  4:19 PM  Result Value Ref Range Status   Specimen Description   Final    BLOOD RIGHT HAND Performed at [place] [address], [place], [place] [address]    Special Requests   Final    BOTTLES DRAWN AEROBIC ONLY Blood Culture results may not be optimal due to an inadequate volume of blood received in culture bottles Performed at [place] [address] [place], [place] [address]    Culture   Final    NO GROWTH 4 DAYS Performed at [facility], [place] [address], [place], [place] [address]    Report Status PENDING  Incomplete  Culture, blood (single)     Status: None (Preliminary result)   Collection Time: 12/22/18  4:24 PM  Result Value Ref Range Status   Specimen Description BLOOD LEFT HAND  Final   Special Requests   Final    BOTTLES DRAWN AEROBIC ONLY Blood Culture results may not be optimal due to an inadequate volume of blood received in culture bottles   Culture   Final    NO GROWTH 2 DAYS Performed at [facility], [place] [address], [place], [place] [address]    Report Status PENDING  Incomplete         Radiology Studies: Ct  Abdomen Pelvis W Contrast  Result Date: 12/23/2018 CLINICAL DATA:  Blood loss.  Anemia.  Recent stent placement. EXAM: CT ABDOMEN AND PELVIS WITH CONTRAST TECHNIQUE: Multidetector CT imaging of the abdomen and pelvis was performed using the standard protocol following bolus administration of intravenous contrast. CONTRAST:  70mL OMNIPAQUE IOHEXOL 300 MG/ML  SOLN COMPARISON:  CT scan 12/10/2018 FINDINGS: Lower chest: Stable basilar emphysematous changes, pulmonary scarring and numerous scattered calcified granulomas. The heart is mildly enlarged but stable. No pericardial effusion. Stable aortic and coronary artery calcifications. Very small pleural effusions with overlying atelectasis. Hepatobiliary: No focal hepatic lesions or intrahepatic biliary dilatation. Small scattered calcified granulomas are again noted. Gallbladder is grossly normal. No common bile duct dilatation. Pancreas: No mass, inflammation or ductal dilatation. Spleen: Scattered calcified granulomas. Normal size. No focal lesions. Adrenals/Urinary Tract: The adrenal glands are unremarkable. Stable bilateral renal cysts. No worrisome renal lesions or hydronephrosis. Stomach/Bowel: The stomach, duodenum, small bowel and colon are unremarkable. No acute inflammatory changes, mass lesions or obstructive findings. The terminal ileum is normal. The appendix is normal. Vascular/Lymphatic: Advanced atherosclerotic calcifications involving the aorta and iliac arteries. No focal aneurysm  or dissection. An IVC filter is noted just below the renal veins. No complicating features. No mesenteric or retroperitoneal mass or adenopathy. Scattered mesenteric and small amount of retroperitoneal fluid. There is also small amount of extraperitoneal pelvic hematoma mainly on the left side. No significant hemorrhage is identified. Reproductive: The uterus and ovaries are unremarkable. Other: Subcutaneous soft tissue swelling/edema/fluid without discrete hematoma. Small  simple appearing cyst is noted near the left hip. Musculoskeletal: The bony structures are intact. There are bilateral hip prostheses without complicating features. Osteoporosis and degenerative lumbar spondylosis with multilevel disc disease and facet disease. Chronic T11 compression fracture. IMPRESSION: 1. Small amount of free abdominal/pelvic fluid but no large mesenteric or retroperitoneal hematoma. There is a small amount of extraperitoneal hematoma in the left pelvis. No large thigh hematoma. 2. Small bilateral pleural effusions with overlying atelectasis. 3. Calcified granulomas in the liver, spleen and lungs. 4. IVC filter in good position without complicating features. Electronically Signed   By: Marijo Sanes M.D.   On: 12/23/2018 12:31   Vas [place] Upper Extremity Venous Duplex  Result Date: 12/24/2018 UPPER VENOUS STUDY  Indications: Pain, and Erythema Limitations: Patient low tolerance to compressions due to pain. Comparison Study: no previous UE duplex Performing Technologist: June Leap RDMS, RVT  Examination Guidelines: A complete evaluation includes B-mode imaging, spectral Doppler, color Doppler, and power Doppler as needed of all accessible portions of each vessel. Bilateral testing is considered an integral part of a complete examination. Limited examinations for reoccurring indications may be performed as noted.  Right Findings: +----------+------------+---------+-----------+----------+-------+  RIGHT      Compressible Phasicity Spontaneous Properties Summary  +----------+------------+---------+-----------+----------+-------+  IJV            Full        Yes        Yes                         +----------+------------+---------+-----------+----------+-------+  Subclavian     Full        Yes        Yes                         +----------+------------+---------+-----------+----------+-------+  Axillary       Full        Yes        Yes                          +----------+------------+---------+-----------+----------+-------+  Brachial       Full        Yes        Yes                         +----------+------------+---------+-----------+----------+-------+  Radial         Full                                               +----------+------------+---------+-----------+----------+-------+  Ulnar          Full                                               +----------+------------+---------+-----------+----------+-------+  Cephalic       Full                                               +----------+------------+---------+-----------+----------+-------+  Basilic        Full                                               +----------+------------+---------+-----------+----------+-------+  Left Findings: +----------+------------+---------+-----------+----------+-------+  LEFT       Compressible Phasicity Spontaneous Properties Summary  +----------+------------+---------+-----------+----------+-------+  Subclavian                 Yes        Yes                         +----------+------------+---------+-----------+----------+-------+  Summary:  Right: No evidence of deep vein thrombosis in the upper extremity. No evidence of superficial vein thrombosis in the upper extremity.  Left: No evidence of thrombosis in the subclavian.  *See table(s) above for measurements and observations.  Diagnosing physician: Servando Snare MD Electronically signed by Servando Snare MD on 12/24/2018 at 12:22:09 PM.    Final       Scheduled Meds:  amiodarone  200 mg Oral Daily   docusate sodium  100 mg Oral BID   feeding supplement (ENSURE ENLIVE)  237 mL Oral BID BM   hydrocortisone sod succinate (SOLU-CORTEF) inj  100 mg Intravenous Q8H   latanoprost  1 drop Both Eyes QHS   nystatin   Topical BID   sodium chloride flush  3 mL Intravenous Q12H   Continuous Infusions:  ceFEPime (MAXIPIME) IV Stopped (12/24/18 0531)   lactated ringers 75 mL/hr at 12/24/18 1554   vancomycin Stopped  (12/24/18 0738)     LOS: 4 days      Berle Mull, MD [facility] Pager: [email or web address] Password [facility] 12/24/2018, 8:46 PM

## 2018-12-24 NOTE — Progress Notes (Signed)
PT Cancellation Note  Patient Details Name: Kelsey [name] MRN: [id number] DOB: 05/12/1932   Cancelled Treatment:    Reason Eval/Treat Not Completed: Patient declined, no reason specified Patient sleeping upon arrival - awakens to voice. Attempted to encourage patient to mobilize as tolerated. Patient politely declines participating with PT this date. Patient declining any current needs. Will continue to follow.    Lanney Gins, PT, DPT Supplemental Physical Therapist 12/24/18 3:48 PM Pager: [phone] Office: [phone]

## 2018-12-25 DIAGNOSIS — I2782 Chronic pulmonary embolism: Secondary | ICD-10-CM

## 2018-12-25 DIAGNOSIS — I2609 Other pulmonary embolism with acute cor pulmonale: Secondary | ICD-10-CM

## 2018-12-25 LAB — CULTURE, BLOOD (ROUTINE X 2)
Culture: NO GROWTH
Culture: NO GROWTH
Special Requests: ADEQUATE

## 2018-12-25 LAB — BASIC METABOLIC PANEL
Anion gap: 9 (ref 5–15)
BUN: 40 mg/dL — ABNORMAL HIGH (ref 8–23)
CO2: 20 mmol/L — ABNORMAL LOW (ref 22–32)
Calcium: 8.5 mg/dL — ABNORMAL LOW (ref 8.9–10.3)
Chloride: 112 mmol/L — ABNORMAL HIGH (ref 98–111)
Creatinine, Ser: 0.94 mg/dL (ref 0.44–1.00)
GFR calc Af Amer: >60 mL/min (ref >60)
GFR calc non Af Amer: 56 mL/min — ABNORMAL LOW (ref >60)
Glucose, Bld: 112 mg/dL — ABNORMAL HIGH (ref 70–99)
Potassium: 4.3 mmol/L (ref 3.5–5.1)
Sodium: 141 mmol/L (ref 135–145)

## 2018-12-25 LAB — CBC
HCT: 33.3 % — ABNORMAL LOW (ref 36.0–46.0)
Hemoglobin: 10.8 g/dL — ABNORMAL LOW (ref 12.0–15.0)
MCH: 31.5 pg (ref 26.0–34.0)
MCHC: 32.4 g/dL (ref 30.0–36.0)
MCV: 97.1 fL (ref 80.0–100.0)
Platelets: 88 10*3/uL — ABNORMAL LOW (ref 150–400)
RBC: 3.43 MIL/uL — ABNORMAL LOW (ref 3.87–5.11)
RDW: 17.4 % — ABNORMAL HIGH (ref 11.5–15.5)
WBC: 11.4 10*3/uL — ABNORMAL HIGH (ref 4.0–10.5)
nRBC: 0 % (ref 0.0–0.2)

## 2018-12-25 LAB — PROCALCITONIN: Procalcitonin: 19.57 ng/mL

## 2018-12-25 MED ORDER — HYDROCORTISONE NA SUCCINATE PF 100 MG IJ SOLR: 50.0000 mg | Freq: Two times a day (BID) | INTRAMUSCULAR | Status: DC

## 2018-12-25 MED ORDER — DOXYCYCLINE HYCLATE 100 MG PO TABS: 100.0000 mg | ORAL_TABLET | Freq: Two times a day (BID) | ORAL | Status: DC

## 2018-12-25 MED ORDER — HALOPERIDOL LACTATE 2 MG/ML PO CONC
0.5000 mg | ORAL | Status: DC | PRN
  Filled 2018-12-25: qty 0.3

## 2018-12-25 MED ORDER — BIOTENE DRY MOUTH MT LIQD: 15.0000 mL | OROMUCOSAL | Status: DC | PRN

## 2018-12-25 MED ORDER — SODIUM CHLORIDE 0.9% FLUSH
3.0000 mL | Freq: Two times a day (BID) | INTRAVENOUS | Status: DC
  Administered 2018-12-25 – 2018-12-26 (×4): 3 mL via INTRAVENOUS

## 2018-12-25 MED ORDER — ONDANSETRON HCL 4 MG/2ML IJ SOLN: 4.0000 mg | Freq: Four times a day (QID) | INTRAMUSCULAR | Status: DC | PRN

## 2018-12-25 MED ORDER — SODIUM CHLORIDE 0.9% FLUSH: 3.0000 mL | INTRAVENOUS | Status: DC | PRN

## 2018-12-25 MED ORDER — GLYCOPYRROLATE 0.2 MG/ML IJ SOLN: 0.2000 mg | INTRAMUSCULAR | Status: DC | PRN

## 2018-12-25 MED ORDER — ONDANSETRON 4 MG PO TBDP: 4.0000 mg | ORAL_TABLET | Freq: Four times a day (QID) | ORAL | Status: DC | PRN

## 2018-12-25 MED ORDER — POLYVINYL ALCOHOL 1.4 % OP SOLN
1.0000 [drp] | Freq: Four times a day (QID) | OPHTHALMIC | Status: DC | PRN
  Filled 2018-12-25: qty 15

## 2018-12-25 MED ORDER — LORAZEPAM 2 MG/ML PO CONC: 1.0000 mg | ORAL | Status: DC | PRN

## 2018-12-25 MED ORDER — PREDNISONE 50 MG PO TABS
50.0000 mg | ORAL_TABLET | Freq: Every day | ORAL | Status: DC
  Administered 2018-12-27: 50 mg via ORAL
  Filled 2018-12-25: qty 1

## 2018-12-25 MED ORDER — HALOPERIDOL LACTATE 5 MG/ML IJ SOLN: 0.5000 mg | INTRAMUSCULAR | Status: DC | PRN

## 2018-12-25 MED ORDER — HALOPERIDOL 0.5 MG PO TABS
0.5000 mg | ORAL_TABLET | ORAL | Status: DC | PRN
  Filled 2018-12-25: qty 1

## 2018-12-25 MED ORDER — ACETAMINOPHEN 650 MG RE SUPP: 650.0000 mg | Freq: Four times a day (QID) | RECTAL | Status: DC | PRN

## 2018-12-25 MED ORDER — LORAZEPAM 2 MG/ML IJ SOLN
1.0000 mg | INTRAMUSCULAR | Status: DC | PRN
  Administered 2018-12-25: 1 mg via INTRAVENOUS
  Filled 2018-12-25: qty 1

## 2018-12-25 MED ORDER — MORPHINE SULFATE (PF) 2 MG/ML IV SOLN: 2.0000 mg | INTRAVENOUS | Status: DC | PRN

## 2018-12-25 MED ORDER — MORPHINE SULFATE (PF) 2 MG/ML IV SOLN
2.0000 mg | Freq: Three times a day (TID) | INTRAVENOUS | Status: DC
  Administered 2018-12-25 – 2018-12-27 (×7): 2 mg via INTRAVENOUS
  Filled 2018-12-25 (×7): qty 1

## 2018-12-25 MED ORDER — LORAZEPAM 1 MG PO TABS: 1.0000 mg | ORAL_TABLET | ORAL | Status: DC | PRN

## 2018-12-25 MED ORDER — HYDROCORTISONE NA SUCCINATE PF 100 MG IJ SOLR: 50.0000 mg | Freq: Three times a day (TID) | INTRAMUSCULAR | Status: DC

## 2018-12-25 MED ORDER — GLYCOPYRROLATE 1 MG PO TABS
1.0000 mg | ORAL_TABLET | ORAL | Status: DC | PRN
  Filled 2018-12-25: qty 1

## 2018-12-25 MED ORDER — SODIUM CHLORIDE 0.9 % IV SOLN: 250.0000 mL | INTRAVENOUS | Status: DC | PRN

## 2018-12-25 MED ORDER — CEFDINIR 300 MG PO CAPS
300.0000 mg | ORAL_CAPSULE | Freq: Two times a day (BID) | ORAL | Status: DC
  Filled 2018-12-25: qty 1

## 2018-12-25 MED ORDER — ACETAMINOPHEN 325 MG PO TABS: 650.0000 mg | ORAL_TABLET | Freq: Four times a day (QID) | ORAL | Status: DC | PRN

## 2018-12-25 NOTE — Plan of Care (Signed)
  Problem: Skin Integrity: Goal: Risk for impaired skin integrity will decrease Outcome: Progressing   

## 2018-12-25 NOTE — Progress Notes (Addendum)
Daily Progress Note   Patient Name: Kelsey [name]       Date: 12/25/2018 DOB: 03/17/1932  Age: 85 y.o. MRN#: [id number] Attending Physician: Lavina Hamman, MD Primary Care Physician: [name], MD Admit Date: 12/19/2018  Reason for Consultation/Follow-up: Establishing goals of care, Hospice Evaluation, Pain control and Psychosocial/spiritual support  Subjective: Met with patient at bedside.  She is pleasantly confused and at first tells me she is in no pain.  Then she says oh my arms hurt, my legs hurt, and my back hurts.   She is child-like in her demeanor and very pleasant.  I ask her if she is eating she tells me yes but has not touched her lunch tray.  I ask her if I can get her anything - "yes, a glass of wine please".  Kelsey [name] and I talked at bedside and then privately.  Kelsey [name] has had a rocky two back to back hospital admissions.  She has suffered with recurrent / severe hypotension and is on IV solucortef.  She is no longer urinating (although her creatinine is normal?).  She is no longer eating.   She immediately bleeds on any anticoagulation or antiplatelets and she clots tremendously (PE) when she is not on them.   She is no longer eating - other than to sip a drink of ginger ale now and then.    Kelsey [name] reports he went to [place] and purchased funeral arrangements yesterday.  He feels she has suffered long enough.  Both Kelsey [name] and Kelsey [name] agree that they do not want her to return to the hospital again.   I explained that Kelsey [name] will never walk or be independent again.  Kelsey [name] promised her she would never be in a nursing home.   We discussed a shift to full comfort measures and Kelsey [name] is supportive of this.    Kelsey [name] asks if the patient's son Kelsey [name] can visit.    Assessment: 85 yo woman.  Pleasantly confused.  Rapid decline with septic shock (strep bacteremia), PE with lung infarct, acute renal failure (now oliguric).  Heart failure with an EF of 25%.  PVD with Rt femoral stent placed this admission unable to take any antiplatelets of anticoagulation.      Patient Profile/HPI:  85 y.o. female  with past medical history of spinal stenosis,  chronic systolic heart failure- recent ECHO showing EF 24-30%, afib, pulmonary HTN, HLD recently admitted for septic shock d/t E.Coli UTI and Strep bacteremia- during that admission found to have PE with lung infarction but could not tolerate anticoagulation due to R calf hematoma, now admitted on 12/19/2018 with SOB- was started on heparin drip d/t PE, transferred to [facility] from [facility] for IVC filter placement (s/p placement 6/2). 6/1 she had a large tarry black stool and significant drop in BP- was given 2 units emergency transfusion for possible hemorrhagic shock. PCT is significantly elevated at 36 showing concern for sepsis as well. She continues to have persistent atrial fib and heart failure that are difficult to control due to continued hypotension. She cannot be anticoagulated due to her tendency to bleed. She was home only 8 days after admission for septic shock and PE before returning to this hospitalization. Her albumin is 1.8. Her prognosis for meaningful recover is poor- palliative medicine consulted for [place]. Husband requests Hospice House care for end of life.    Length of Stay: 5  Current Medications: Scheduled Meds:  . amiodarone  200 mg Oral Daily  . docusate sodium  100 mg Oral BID  . latanoprost  1 drop Both Eyes QHS  .  morphine injection  2 mg Intravenous Q8H  . nystatin   Topical BID  . [START ON 12/27/2018] predniSONE  50 mg Oral Q breakfast  . sodium chloride flush  3 mL Intravenous Q12H  . sodium chloride flush  3 mL Intravenous Q12H    Continuous Infusions: . sodium chloride      PRN Meds:  sodium chloride, acetaminophen **OR** acetaminophen, antiseptic oral rinse, glycopyrrolate **OR** glycopyrrolate **OR** glycopyrrolate, haloperidol **OR** haloperidol **OR** haloperidol lactate, LORazepam **OR** LORazepam **OR** LORazepam, morphine injection, ondansetron **OR** ondansetron (ZOFRAN) IV, polyethylene glycol, polyvinyl alcohol, sodium chloride flush, sodium chloride flush  Physical Exam        Elderly pleasantly confused female with flushed face. Awake, orientated to person.  Recognizes her husband and is able to chat coherently with him. CV irreg irreg resp no distress on 2L [place] Abdomen soft, nt, nd, obese Extremities swollen with large hematoma in her calves bilaterally.  Vital Signs: BP 125/80 (BP Location: Left Wrist)   Pulse (!) 112   Temp 97.7 F (36.5 C) (Axillary)   Resp 19   Ht 5' 8"  (1.727 m)   Wt 86.2 kg   SpO2 90%   BMI 28.89 kg/m  SpO2: SpO2: 90 % O2 Device: O2 Device: Nasal Cannula O2 Flow Rate: O2 Flow Rate (L/min): 2 L/min  Intake/output summary:   Intake/Output Summary (Last 24 hours) at 12/25/2018 1507 Last data filed at 12/25/2018 0707 Gross per 24 hour  Intake 120 ml  Output -  Net 120 ml   LBM: Last BM Date: 12/25/18(per patient report) Baseline Weight: Weight: 80.6 kg Most recent weight: Weight: 86.2 kg       Palliative Assessment/Data: 20%    Flowsheet Rows     Most Recent Value  Intake Tab  Referral Department  Hospitalist  Unit at Time of Referral  Cardiac/Telemetry Unit  Palliative Care Primary Diagnosis  Cardiac  Date Notified  12/22/18  Palliative Care Type  New Palliative care  Reason for referral  Clarify Goals of Care  Date of Admission  12/19/18  Date first seen by Palliative Care  12/23/18  # of days Palliative referral response time  1 Day(s)  # of days IP prior to Palliative referral  3  Clinical Assessment  Psychosocial & Spiritual Assessment  Palliative Care Outcomes      Patient Active Problem List    Diagnosis Date Noted  . Sepsis ([place])   . Palliative care by specialist   . Goals of care, counseling/discussion   . Advanced care planning/counseling discussion   . Pulmonary emboli ([place]) 12/21/2018  . Hematoma of lower leg   . Chronic systolic CHF (congestive heart failure) ([place]) 12/19/2018  . Acute systolic heart failure ([place])   . Atrial fibrillation with RVR ([place])   . Elevated troponin   . Acute on chronic combined systolic and diastolic CHF (congestive heart failure) ([place])   . Severe sepsis ([place]) 12/01/2018  . Cardiogenic shock ([place]) 12/01/2018  . Elevated lactic acid level   . Hypotension due to hypovolemia   . Chronic cough 03/07/2014  . Chronic atrial fibrillation with RVR 03/07/2014  . Chronic anticoagulation 05/09/2013  . Diastolic dysfunction   . Vocal cord polyp 05/08/2013  . PSVT (paroxysmal supraventricular tachycardia) ([place]) 05/08/2013  . Allergic rhinitis 05/08/2013  . Hypertension   . Dyslipidemia   . Cancer ([place])   . Arthritis   . Pulmonary HTN ([place])     Palliative Care Plan    Recommendations/Plan:  Will change orders to reflect comfort measures only.  DC antibiotics and IV solu-cortef.  Patient denies pain then describes pain in arms/legs/back.  Will schedule morphine.  Hospice House requested by husband.  Request for Son to visit has been made to [facility].  Goals of Care and Additional Recommendations:  Limitations on Scope of Treatment: Full Comfort Care  Code Status:  DNR  Prognosis:   < 2 weeks patient no longer eating/drinking/urinating.   Discharge Planning:  Hospice facility  Care plan was discussed with husband, RN, attending MD.  Thank you for allowing the Palliative Medicine Team to assist in the care of this patient.  Total time spent:  85 min. Time in 2:00 Time out: 3:25     Greater than 50%  of this time was spent counseling and coordinating care related to the above assessment and plan.  Florentina Jenny, PA-C  Palliative Medicine  Please contact Palliative MedicineTeam phone at [phone] for questions and concerns between 7 am - 7 pm.   Please see AMION for individual provider pager numbers.

## 2018-12-25 NOTE — Progress Notes (Signed)
Called patient spouse this AM, all questions answered at this time.Kelsey [name], Bettina Gavia rN

## 2018-12-25 NOTE — Progress Notes (Addendum)
PROGRESS NOTE    Kelsey [name]   QMV:[id number]  DOB: 07-Oct-1931  DOA: 12/19/2018 PCP: [name], MD   Brief Narrative:  Kelsey [name] is an 85 y/o with spinal stenosis (moslty sedentary) chronic atrial fibrillation off of Xarelto due to "blotches on her hand", PVD,  breast implants, chronic systolic heart failure, pulmonary hypertension, hyperlipidemia, vocal cord paralysis, septic shock, AKI and incidental PT on last admission. She also was found on 5/19  to have a right calf hematoma without any known injury. She was on full dose Lovenox in the hospital and it was discontinued after this hematoma was found. She was later found to have a PE when a CT of the abd/pelvis. PE was in the RL base and was associated with a right subsegmental infarct. She had no symptoms and no DVT was found on venous duplex.  She returns to [facility] shortness of breath and is found to be hypoxic to 85% on RA and required 4 L. CTA Chest>  Segmental/subsegmental right lower lobe pulmonary emboli, as above. Overall clot burden is mild. Transferred to [facility] for IVC filter.   Subjective: Appears to have a new bruise developed on left arm.  No nausea no vomiting.  Feels better.  Refusing to move her arms or legs.  No further bleeding reported by the RN from the right leg.  Assessment & Plan:   Principal Problem:   Pulmonary emboli with pulmonary infarct/ acute respiratory failure - unable to anticoagulate due to calf hematomas bilaterally - she has received an IVC filter by vascular surgery and had an Aortogram with b/l runoff due to PVD- see below Postprocedure patient was started on IV heparin. Hemoglobin dropped to 7.7 with hypotension. I suspect that this is a significant contraindication for the patient for anticoagulation. Discontinue all anticoagulation as well as antiplatelet medication.  Shock. Hemorrhagic versus septic. Blood pressure dropped to 50s.  Heart rate  120s. Patient went into A. fib with RVR. Patient also was febrile. Suspect this is actually more likely hemorrhagic shock again septic shock. CT abdomen pelvis performed on 12/23/2018 shows no evidence of significant hematoma.  Mild groin hematoma which does not explain hemorrhagic soft as a sole etiology although patient did respond to massive blood transfusion.  Discussed with CCM patient not a great candidate for any aggressive intervention patient already DNR/DNI.  Prognosis is significantly poor and therefore recommending no pressors. Patient was given emergent transfusion after receiving consent from her husband as patient was having a life-threatening emergency. After the blood transfusion patient showed significant improvement in her mentation although blood pressure continues to remain low. Patient received total of 3 PRBC transfusion with improvement in hemoglobin up to 11. Remains hypotensive for now. Continue Solu-Cortef.     Goals of care discussion. Not a great situation here. Patient has a history of A. fib as well as PE but not a great candidate for anticoagulation. Any attempt of anticoagulation has resulted into worsening hematoma as well as bleeding. Currently I am suspecting that she is actually suffering from retroperitoneal hematoma or a groin hematoma or rectus sheath hematoma. CT abdomen and pelvis is ordered but currently unstable for performing that evaluation. Patient has been 3 PRBC with improvement in hemoglobin but no improvement in her blood pressure. Urine output worsening.  Renal function worsening.  Liver function worsening. Currently her goal is to continue to treat what is treatable. I recommended her to consider option of comfort care and palliative care as the  patient's prognosis is poor. Highly appreciate palliative care's assistance.  After family meeting on 12/25/2018 family decided transient complete comfort.  Plan is to discharge at residential hospice  facility.  Prognosis less than 2 weeks.  Patient currently stable for transfer whenever a bed is available.   A. fib with RVR. Acute diastolic dysfunction. Appreciate cardiology assistance. Currently appears her cardiac problems are secondary to other medical etiologies.  Will monitor.  Calf hematoma and severe PVD - apparently was a spontaneous hematoma while on anticoagulation in hospital (full dose Lovenox) noted on 5/19 - Right SFA stent should allow for increased perfusion to let wound heal - vascular surgery has started ASA and Plavix -Discontinue Unna boots. Not a great candidate for debridement, appreciate vascular surgery assistance.  Fever 102.4 on 5/31 Septic work-up initiated. Follow-up on cultures.  So far negative    Hypertension,   Chronic systolic CHF (EF 84-69%),  Pulmonary HTN  -Hold Coreg, lasix    Chronic atrial fibrillation with RVR  -Hold Coreg, Amiodarone- tachycardic post op   - avoid AC - she was previously on Xarelto   Chronic back pain Chronic steroids. Patient is on chronic prednisone 50 mg daily. We will provide stress dose steroids.  Time spent in minutes: 35 minutes   DVT prophylaxis: Contraindicated for now.  Bilateral calf hematoma Code Status: DNR Family Communication: Discussed with [place]. Due to patient's life-threatening emergency request of husband to be at bedside.  Received permission from [facility].  Disposition Plan: will need PT eval Consultants:  Vascular surgery Cardiology Palliative care  Procedures:   6/2 1.  Ultrasound-guided access, left femoral artery             2.  Abdominal aortogram             3.  Bilateral lower extremity runoff             4.  Stent, right superficial femoral artery             5.  Closure device (Mynx             6.  Ultrasound-guided access, right femoral vein             7.  Inferior venacavogram                    8.  Placement of inferior vena cava filter             9.  Conscious sedation  (87 minutes) Antimicrobials:  Anti-infectives (From admission, onward)   Start     Dose/Rate Route Frequency Ordered Stop   12/25/18 2200  cefdinir (OMNICEF) capsule 300 mg  Status:  Discontinued     300 mg Oral Every 12 hours 12/25/18 1038 12/25/18 1457   12/25/18 2200  doxycycline (VIBRA-TABS) tablet 100 mg  Status:  Discontinued     100 mg Oral Every 12 hours 12/25/18 1042 12/25/18 1457   12/25/18 1045  doxycycline (VIBRA-TABS) tablet 100 mg  Status:  Discontinued     100 mg Oral Every 12 hours 12/25/18 1038 12/25/18 1042   12/24/18 0600  vancomycin (VANCOCIN) 1,500 mg in sodium chloride 0.9 % 500 mL IVPB  Status:  Discontinued     1,500 mg 250 mL/hr over 120 Minutes Intravenous Every 36 hours 12/23/18 1645 12/25/18 1038   12/24/18 0600  ceFEPIme (MAXIPIME) 2 g in sodium chloride 0.9 % 100 mL IVPB  Status:  Discontinued     2 g 200  mL/hr over 30 Minutes Intravenous Every 24 hours 12/23/18 1709 12/25/18 1038   12/23/18 0400  ceFEPIme (MAXIPIME) 2 g in sodium chloride 0.9 % 100 mL IVPB  Status:  Discontinued     2 g 200 mL/hr over 30 Minutes Intravenous Every 12 hours 12/22/18 1444 12/23/18 1709   12/22/18 1600  vancomycin (VANCOCIN) 1,500 mg in sodium chloride 0.9 % 500 mL IVPB  Status:  Discontinued     1,500 mg 250 mL/hr over 120 Minutes Intravenous Every 24 hours 12/22/18 1444 12/23/18 1645   12/22/18 1400  ceFEPIme (MAXIPIME) 2 g in sodium chloride 0.9 % 100 mL IVPB     2 g 200 mL/hr over 30 Minutes Intravenous  Once 12/22/18 1353 12/22/18 1603   12/22/18 1400  vancomycin (VANCOCIN) 1,250 mg in sodium chloride 0.9 % 250 mL IVPB  Status:  Discontinued     1,250 mg 166.7 mL/hr over 90 Minutes Intravenous  Once 12/22/18 1353 12/22/18 1444       Objective: Vitals:   12/25/18 0633 12/25/18 0655 12/25/18 0857 12/25/18 1317  BP:  (!) 120/96 125/80 121/76  Pulse:  (!) 112    Resp:  19    Temp:  98.6 F (37 C) 97.6 F (36.4 C) 97.7 F (36.5 C)  TempSrc:  Oral Oral Axillary    SpO2:  90%    Weight: 86.2 kg     Height:        Intake/Output Summary (Last 24 hours) at 12/25/2018 1922 Last data filed at 12/25/2018 0707 Gross per 24 hour  Intake 120 ml  Output --  Net 120 ml   Filed Weights   12/23/18 0500 12/24/18 0359 12/25/18 0633  Weight: 84.2 kg 86.9 kg 86.2 kg    Examination: General exam: Appears comfortable  HEENT: PERRLA, oral mucosa moist, no sclera icterus or thrush Respiratory system: Clear to auscultation. Respiratory effort normal. Cardiovascular system: S1 & S2 heard, IIRR.   Gastrointestinal system: Abdomen soft, non-tender, nondistended. Normal bowel sounds. Central nervous system: Alert and oriented. No focal neurological deficits. Extremities: No cyanosis, clubbing or edema Skin: both legs are wrapped Psychiatry:  Mood & affect appropriate.       Data Reviewed: I have personally reviewed following labs and imaging studies  CBC: Recent Labs  Lab 12/19/18 0341  12/22/18 1346 12/22/18 1630 12/23/18 0716 12/24/18 0723 12/25/18 0438  WBC 9.4   < > 2.4* 3.3* 6.1 8.8 11.4*  NEUTROABS 6.2  --   --   --  3.7 7.9*  --   HGB 10.1*   < > 7.2* 8.1* 11.4* 10.9* 10.8*  HCT 33.7*   < > 22.8* 25.0* 35.3* 33.6* 33.3*  MCV 112.3*   < > 104.6* 100.4* 100.3* 97.4 97.1  PLT 298   < > 132* 118* 104* 93* 88*   < > = values in this interval not displayed.   Basic Metabolic Panel: Recent Labs  Lab 12/21/18 0535 12/22/18 0306 12/23/18 0716 12/24/18 0723 12/25/18 0438  NA 140 139 140 137 141  K 4.1 3.7 3.9 3.9 4.3  CL 104 105 113* 107 112*  CO2 28 23 16* 19* 20*  GLUCOSE 92 78 174* 127* 112*  BUN 15 16 27* 36* 40*  CREATININE 0.87 0.91 1.12* 0.99 0.94  CALCIUM 8.2* 7.5* 7.3* 7.9* 8.5*   GFR: Estimated Creatinine Clearance: 51.2 mL/min (by C-G formula based on SCr of 0.94 mg/dL). Liver Function Tests: Recent Labs  Lab 12/23/18 0716 12/24/18 0723  AST 18  15  ALT 13 11  ALKPHOS 47 59  BILITOT 1.6* 1.2  PROT 4.9* 4.9*  ALBUMIN  1.8* 1.7*   No results for input(s): LIPASE, AMYLASE in the last 168 hours. No results for input(s): AMMONIA in the last 168 hours. Coagulation Profile: Recent Labs  Lab 12/22/18 1346  INR 1.7*   Cardiac Enzymes: No results for input(s): CKTOTAL, CKMB, CKMBINDEX, TROPONINI in the last 168 hours. BNP (last 3 results) No results for input(s): PROBNP in the last 8760 hours. HbA1C: No results for input(s): HGBA1C in the last 72 hours. CBG: Recent Labs  Lab 12/22/18 1416  GLUCAP 101*   Lipid Profile: No results for input(s): CHOL, HDL, LDLCALC, TRIG, CHOLHDL, LDLDIRECT in the last 72 hours. Thyroid Function Tests: No results for input(s): TSH, T4TOTAL, FREET4, T3FREE, THYROIDAB in the last 72 hours. Anemia Panel: No results for input(s): VITAMINB12, FOLATE, FERRITIN, TIBC, IRON, RETICCTPCT in the last 72 hours. Urine analysis:    Component Value Date/Time   COLORURINE AMBER (A) 12/23/2018 0258   APPEARANCEUR CLOUDY (A) 12/23/2018 0258   LABSPEC 1.032 (H) 12/23/2018 0258   PHURINE 5.0 12/23/2018 0258   GLUCOSEU NEGATIVE 12/23/2018 0258   HGBUR NEGATIVE 12/23/2018 0258   BILIRUBINUR NEGATIVE 12/23/2018 0258   KETONESUR NEGATIVE 12/23/2018 0258   PROTEINUR 30 (A) 12/23/2018 0258   NITRITE NEGATIVE 12/23/2018 0258   LEUKOCYTESUR NEGATIVE 12/23/2018 0258   Sepsis Labs: @LABRCNTIP (procalcitonin:4,lacticidven:4) ) Recent Results (from the past 240 hour(s))  SARS Coronavirus 2 (CEPHEID - Performed in [place] hospital lab), Hosp Order     Status: None   Collection Time: 12/19/18  3:41 AM  Result Value Ref Range Status   SARS Coronavirus 2 NEGATIVE NEGATIVE Final    Comment: (NOTE) If result is NEGATIVE SARS-CoV-2 target nucleic acids are NOT DETECTED. The SARS-CoV-2 RNA is generally detectable in upper and lower  respiratory specimens during the acute phase of infection. The lowest  concentration of SARS-CoV-2 viral copies this assay can detect is 250  copies / mL. A  negative result does not preclude SARS-CoV-2 infection  and should not be used as the sole basis for treatment or other  patient management decisions.  A negative result may occur with  improper specimen collection / handling, submission of specimen other  than nasopharyngeal swab, presence of viral mutation(s) within the  areas targeted by this assay, and inadequate number of viral copies  (<250 copies / mL). A negative result must be combined with clinical  observations, patient history, and epidemiological information. If result is POSITIVE SARS-CoV-2 target nucleic acids are DETECTED. The SARS-CoV-2 RNA is generally detectable in upper and lower  respiratory specimens dur ing the acute phase of infection.  Positive  results are indicative of active infection with SARS-CoV-2.  Clinical  correlation with patient history and other diagnostic information is  necessary to determine patient infection status.  Positive results do  not rule out bacterial infection or co-infection with other viruses. If result is PRESUMPTIVE POSTIVE SARS-CoV-2 nucleic acids MAY BE PRESENT.   A presumptive positive result was obtained on the submitted specimen  and confirmed on repeat testing.  While 2019 novel coronavirus  (SARS-CoV-2) nucleic acids may be present in the submitted sample  additional confirmatory testing may be necessary for epidemiological  and / or clinical management purposes  to differentiate between  SARS-CoV-2 and other Sarbecovirus currently known to infect humans.  If clinically indicated additional testing with an alternate test  methodology [phone]) is advised. The SARS-CoV-2  RNA is generally  detectable in upper and lower respiratory sp ecimens during the acute  phase of infection. The expected result is Negative. Fact Sheet for Patients:  StrictlyIdeas.no Fact Sheet for Healthcare Providers: BankingDealers.co.za This test is not  yet approved or cleared by the [place] FDA and has been authorized for detection and/or diagnosis of SARS-CoV-2 by FDA under an Emergency Use Authorization (EUA).  This EUA will remain in effect (meaning this test can be used) for the duration of the COVID-19 declaration under Section 564(b)(1) of the Act, 21 U.S.C. section 360bbb-3(b)(1), unless the authorization is terminated or revoked sooner. Performed at [facility], [place] [address], [place], [place] [address]   Culture, blood (Routine X 2) w Reflex to ID Panel     Status: None   Collection Time: 12/20/18  4:13 PM  Result Value Ref Range Status   Specimen Description   Final    BLOOD RIGHT HAND Performed at [place] [address], [place], [place] [address]    Special Requests   Final    BOTTLES DRAWN AEROBIC AND ANAEROBIC Blood Culture adequate volume Performed at [facility] [address], [place], [place] [address]    Culture   Final    NO GROWTH 5 DAYS Performed at [facility], [place] [address], [place], [place] [address]    Report Status 12/25/2018 FINAL  Final  Culture, blood (Routine X 2) w Reflex to ID Panel     Status: None   Collection Time: 12/20/18  4:19 PM  Result Value Ref Range Status   Specimen Description   Final    BLOOD RIGHT HAND Performed at [facility] [address] [place], [place] [address]    Special Requests   Final    BOTTLES DRAWN AEROBIC ONLY Blood Culture results may not be optimal due to an inadequate volume of blood received in culture bottles Performed at [place] [address] [place], [place] [address]    Culture   Final    NO GROWTH 5 DAYS Performed at [facility], [place] [address], [place], [place] [address]    Report Status 12/25/2018 FINAL  Final  Culture, blood (single)     Status: None (Preliminary result)   Collection Time: 12/22/18  4:24 PM    Result Value Ref Range Status   Specimen Description BLOOD LEFT HAND  Final   Special Requests   Final    BOTTLES DRAWN AEROBIC ONLY Blood Culture results may not be optimal due to an inadequate volume of blood received in culture bottles   Culture   Final    NO GROWTH 3 DAYS Performed at [facility], [place] [address] [place], [place] [address]    Report Status PENDING  Incomplete         Radiology Studies: Vas [place] Upper Extremity Venous Duplex  Result Date: 12/24/2018 UPPER VENOUS STUDY  Indications: Pain, and Erythema Limitations: Patient low tolerance to compressions due to pain. Comparison Study: no previous UE duplex Performing Technologist: June Leap RDMS, RVT  Examination Guidelines: A complete evaluation includes B-mode imaging, spectral Doppler, color Doppler, and power Doppler as needed of all accessible portions of each vessel. Bilateral testing is considered an integral part of a complete examination. Limited examinations for reoccurring indications may be performed as noted.  Right Findings: +----------+------------+---------+-----------+----------+-------+  RIGHT      Compressible Phasicity Spontaneous Properties Summary  +----------+------------+---------+-----------+----------+-------+  IJV            Full        Yes        Yes                         +----------+------------+---------+-----------+----------+-------+  Subclavian     Full        Yes        Yes                         +----------+------------+---------+-----------+----------+-------+  Axillary       Full        Yes        Yes                         +----------+------------+---------+-----------+----------+-------+  Brachial       Full        Yes        Yes                         +----------+------------+---------+-----------+----------+-------+  Radial         Full                                               +----------+------------+---------+-----------+----------+-------+  Ulnar          Full                                                +----------+------------+---------+-----------+----------+-------+  Cephalic       Full                                               +----------+------------+---------+-----------+----------+-------+  Basilic        Full                                               +----------+------------+---------+-----------+----------+-------+  Left Findings: +----------+------------+---------+-----------+----------+-------+  LEFT       Compressible Phasicity Spontaneous Properties Summary  +----------+------------+---------+-----------+----------+-------+  Subclavian                 Yes        Yes                         +----------+------------+---------+-----------+----------+-------+  Summary:  Right: No evidence of deep vein thrombosis in the upper extremity. No evidence of superficial vein thrombosis in the upper extremity.  Left: No evidence of thrombosis in the subclavian.  *See table(s) above for measurements and observations.  Diagnosing physician: Servando Snare MD Electronically signed by Servando Snare MD on 12/24/2018 at 12:22:09 PM.    Final       Scheduled Meds:  amiodarone  200 mg Oral Daily   docusate sodium  100 mg Oral BID   latanoprost  1 drop Both Eyes QHS  morphine injection  2 mg Intravenous Q8H   nystatin   Topical BID   [START ON 12/27/2018] predniSONE  50 mg Oral Q breakfast   sodium chloride flush  3 mL Intravenous Q12H   sodium chloride flush  3 mL Intravenous Q12H   Continuous Infusions:  sodium chloride       LOS: 5 days      Berle Mull, MD [facility] Pager: [email or web address] Password [email or web address] 12/25/2018, 7:22 PM

## 2018-12-25 NOTE — Progress Notes (Signed)
Patient encouraged to turn to side so RN could assess skin on back of patient. Patient declines to do this at this time. Patient states she is comfortable and she doesn't want to turn. Will continue to encourage turning. Kelsey [name], Bettina Gavia rN

## 2018-12-25 NOTE — Progress Notes (Signed)
CSW spoke to pt's nurse Erasmo Downer about family's decision for hospice care for pt and nurse wanted to know if discharge could occur today. CSW reached out to [facility]) and made a referral for pt. CSW was informed that [facility] informed her that it would be most likely Monday before they would have a bed if pt is accepted. CSW informed pt's nurse of [facility] response.

## 2018-12-26 DIAGNOSIS — I482 Chronic atrial fibrillation, unspecified: Secondary | ICD-10-CM

## 2018-12-26 MED ORDER — WHITE PETROLATUM EX OINT
TOPICAL_OINTMENT | CUTANEOUS | Status: AC
  Administered 2018-12-26: 1
  Filled 2018-12-26: qty 28.35

## 2018-12-26 MED ORDER — HALOPERIDOL LACTATE 5 MG/ML IJ SOLN: 2.0000 mg | INTRAMUSCULAR | Status: DC | PRN

## 2018-12-26 MED ORDER — HALOPERIDOL 2 MG PO TABS
2.0000 mg | ORAL_TABLET | ORAL | Status: DC | PRN
  Filled 2018-12-26: qty 1

## 2018-12-26 MED ORDER — HALOPERIDOL LACTATE 2 MG/ML PO CONC
2.0000 mg | ORAL | Status: DC | PRN
  Filled 2018-12-26: qty 1

## 2018-12-26 NOTE — Progress Notes (Signed)
Attempted to change dressings on BLE, patient will not tolerate due to excessive pain. Soiled Kerlix changed on RLE. Patient's skin extremely red and scattered blisters.

## 2018-12-26 NOTE — Progress Notes (Signed)
PROGRESS NOTE  Kelsey [name]   GYI:[id number]  DOB: 11-17-1931  DOA: 12/19/2018 PCP: [name], MD   Brief Narrative:  Kelsey [name] is an 85 y/o with spinal stenosis (moslty sedentary) chronic atrial fibrillation off of Xarelto due to "blotches on her hand", PVD,  breast implants, chronic systolic heart failure, pulmonary hypertension, hyperlipidemia, vocal cord paralysis, septic shock, AKI and incidental PT on last admission. She also was found on 5/19  to have a right calf hematoma without any known injury. She was on full dose Lovenox in the hospital and it was discontinued after this hematoma was found. She was later found to have a PE when a CT of the abd/pelvis. PE was in the RL base and was associated with a right subsegmental infarct. She had no symptoms and no DVT was found on venous duplex.  She returns to [facility] shortness of breath and is found to be hypoxic to 85% on RA and required 4 L. CTA Chest>  Segmental/subsegmental right lower lobe pulmonary emboli, as above. Overall clot burden is mild. Transferred to [facility] for IVC filter.  Subjective: Confused, pleasantly though.  No nausea no vomiting no other acute complaints.  Pain well controlled.  Assessment & Plan:   Principal Problem: Pulmonary emboli with pulmonary infarct/ acute respiratory failure - unable to anticoagulate due to calf hematomas bilaterally - she has received an IVC filter by vascular surgery and had an Aortogram with b/l runoff due to PVD- see below Postprocedure patient was started on IV heparin. Hemoglobin dropped to 7.7 with hypotension. I suspect that this is a significant contraindication for the patient for anticoagulation. Discontinue all anticoagulation as well as antiplatelet medication.  Shock. Hemorrhagic versus septic. Blood pressure dropped to 50s.  Heart rate 120s. Patient went into A. fib with RVR. Patient also was febrile. Suspect this is actually more  likely hemorrhagic shock again septic shock. CT abdomen pelvis performed on 12/23/2018 shows no evidence of significant hematoma.  Mild groin hematoma which does not explain hemorrhagic soft as a sole etiology although patient did respond to massive blood transfusion.  Discussed with CCM patient not a great candidate for any aggressive intervention patient already DNR/DNI.  Prognosis is significantly poor and therefore recommending no pressors. Patient was given emergent transfusion after receiving consent from her husband as patient was having a life-threatening emergency. After the blood transfusion patient showed significant improvement in her mentation although blood pressure continues to remain low. Patient received total of 3 PRBC transfusion with improvement in hemoglobin up to 11. Remains hypotensive for now. Continue Solu-Cortef.     Goals of care discussion. Not a great situation here. Patient has a history of A. fib as well as PE but not a great candidate for anticoagulation. Any attempt of anticoagulation has resulted into worsening hematoma as well as bleeding. Currently I am suspecting that she is actually suffering from retroperitoneal hematoma or a groin hematoma or rectus sheath hematoma. CT abdomen and pelvis is ordered but currently unstable for performing that evaluation. Patient has been 3 PRBC with improvement in hemoglobin but no improvement in her blood pressure. Urine output worsening.  Renal function worsening.  Liver function worsening. Currently her goal is to continue to treat what is treatable. I recommended her to consider option of comfort care and palliative care as the patient's prognosis is poor. Highly appreciate palliative care's assistance.  After family meeting on 12/25/2018 family decided transient complete comfort.  Plan is to discharge at residential  hospice facility.  Prognosis less than 2 weeks.  Patient currently stable for transfer whenever a bed is  available.   A. fib with RVR. Acute diastolic dysfunction. Appreciate cardiology assistance. Currently appears her cardiac problems are secondary to other medical etiologies.  Will monitor.  Calf hematoma and severe PVD - apparently was a spontaneous hematoma while on anticoagulation in hospital (full dose Lovenox) noted on 5/19 - Right SFA stent should allow for increased perfusion to let wound heal - vascular surgery has started ASA and Plavix -Discontinue Unna boots. Not a great candidate for debridement, appreciate vascular surgery assistance.  Fever 102.4 on 5/31 Septic work-up initiated. Follow-up on cultures.  So far negative    Hypertension,   Chronic systolic CHF (EF 16-60%),  Pulmonary HTN  -Hold Coreg, lasix    Chronic atrial fibrillation with RVR  -Hold Coreg, Amiodarone- tachycardic post op   - avoid AC - she was previously on Xarelto   Chronic back pain Chronic steroids. Patient is on chronic prednisone 50 mg daily. We will provide stress dose steroids.  Time spent in minutes: 35 minutes   DVT prophylaxis: Contraindicated for now.  Bilateral calf hematoma Code Status: DNR Family Communication: Discussed with [place]. Due to patient's life-threatening emergency request of husband to be at bedside.  Received permission from [facility].  Disposition Plan: will need PT eval Consultants:  Vascular surgery Cardiology Palliative care  Procedures:   6/2 1.  Ultrasound-guided access, left femoral artery             2.  Abdominal aortogram             3.  Bilateral lower extremity runoff             4.  Stent, right superficial femoral artery             5.  Closure device (Mynx             6.  Ultrasound-guided access, right femoral vein             7.  Inferior venacavogram                    8.  Placement of inferior vena cava filter             9.  Conscious sedation (87 minutes) Antimicrobials:  Anti-infectives (From admission, onward)   Start     Dose/Rate  Route Frequency Ordered Stop   12/25/18 2200  cefdinir (OMNICEF) capsule 300 mg  Status:  Discontinued     300 mg Oral Every 12 hours 12/25/18 1038 12/25/18 1457   12/25/18 2200  doxycycline (VIBRA-TABS) tablet 100 mg  Status:  Discontinued     100 mg Oral Every 12 hours 12/25/18 1042 12/25/18 1457   12/25/18 1045  doxycycline (VIBRA-TABS) tablet 100 mg  Status:  Discontinued     100 mg Oral Every 12 hours 12/25/18 1038 12/25/18 1042   12/24/18 0600  vancomycin (VANCOCIN) 1,500 mg in sodium chloride 0.9 % 500 mL IVPB  Status:  Discontinued     1,500 mg 250 mL/hr over 120 Minutes Intravenous Every 36 hours 12/23/18 1645 12/25/18 1038   12/24/18 0600  ceFEPIme (MAXIPIME) 2 g in sodium chloride 0.9 % 100 mL IVPB  Status:  Discontinued     2 g 200 mL/hr over 30 Minutes Intravenous Every 24 hours 12/23/18 1709 12/25/18 1038   12/23/18 0400  ceFEPIme (MAXIPIME) 2 g in sodium chloride 0.9 % 100  mL IVPB  Status:  Discontinued     2 g 200 mL/hr over 30 Minutes Intravenous Every 12 hours 12/22/18 1444 12/23/18 1709   12/22/18 1600  vancomycin (VANCOCIN) 1,500 mg in sodium chloride 0.9 % 500 mL IVPB  Status:  Discontinued     1,500 mg 250 mL/hr over 120 Minutes Intravenous Every 24 hours 12/22/18 1444 12/23/18 1645   12/22/18 1400  ceFEPIme (MAXIPIME) 2 g in sodium chloride 0.9 % 100 mL IVPB     2 g 200 mL/hr over 30 Minutes Intravenous  Once 12/22/18 1353 12/22/18 1603   12/22/18 1400  vancomycin (VANCOCIN) 1,250 mg in sodium chloride 0.9 % 250 mL IVPB  Status:  Discontinued     1,250 mg 166.7 mL/hr over 90 Minutes Intravenous  Once 12/22/18 1353 12/22/18 1444       Objective: Vitals:   12/25/18 0633 12/25/18 0655 12/25/18 0857 12/25/18 1317  BP:  (!) 120/96 125/80 121/76  Pulse:  (!) 112    Resp:  19    Temp:  98.6 F (37 C) 97.6 F (36.4 C) 97.7 F (36.5 C)  TempSrc:  Oral Oral Axillary  SpO2:  90%    Weight: 86.2 kg     Height:        Intake/Output Summary (Last 24 hours) at  12/26/2018 1942 Last data filed at 12/26/2018 1700 Gross per 24 hour  Intake 150 ml  Output 200 ml  Net -50 ml   Filed Weights   12/23/18 0500 12/24/18 0359 12/25/18 0633  Weight: 84.2 kg 86.9 kg 86.2 kg    Examination: General exam: Appears comfortable  HEENT: PERRLA, oral mucosa moist, no sclera icterus or thrush Respiratory system: Clear to auscultation. Respiratory effort normal. Cardiovascular system: S1 & S2 heard, IIRR.   Gastrointestinal system: Abdomen soft, non-tender, nondistended. Normal bowel sounds. Central nervous system: Alert and oriented. No focal neurological deficits. Extremities: No cyanosis, clubbing or edema Skin: both legs are wrapped Psychiatry:  Mood & affect appropriate.       Data Reviewed: I have personally reviewed following labs and imaging studies  CBC: Recent Labs  Lab 12/22/18 1346 12/22/18 1630 12/23/18 0716 12/24/18 0723 12/25/18 0438  WBC 2.4* 3.3* 6.1 8.8 11.4*  NEUTROABS  --   --  3.7 7.9*  --   HGB 7.2* 8.1* 11.4* 10.9* 10.8*  HCT 22.8* 25.0* 35.3* 33.6* 33.3*  MCV 104.6* 100.4* 100.3* 97.4 97.1  PLT 132* 118* 104* 93* 88*   Basic Metabolic Panel: Recent Labs  Lab 12/21/18 0535 12/22/18 0306 12/23/18 0716 12/24/18 0723 12/25/18 0438  NA 140 139 140 137 141  K 4.1 3.7 3.9 3.9 4.3  CL 104 105 113* 107 112*  CO2 28 23 16* 19* 20*  GLUCOSE 92 78 174* 127* 112*  BUN 15 16 27* 36* 40*  CREATININE 0.87 0.91 1.12* 0.99 0.94  CALCIUM 8.2* 7.5* 7.3* 7.9* 8.5*   GFR: Estimated Creatinine Clearance: 51.2 mL/min (by C-G formula based on SCr of 0.94 mg/dL). Liver Function Tests: Recent Labs  Lab 12/23/18 0716 12/24/18 0723  AST 18 15  ALT 13 11  ALKPHOS 47 59  BILITOT 1.6* 1.2  PROT 4.9* 4.9*  ALBUMIN 1.8* 1.7*   No results for input(s): LIPASE, AMYLASE in the last 168 hours. No results for input(s): AMMONIA in the last 168 hours. Coagulation Profile: Recent Labs  Lab 12/22/18 1346  INR 1.7*   Cardiac Enzymes:  No results for input(s): CKTOTAL, CKMB, CKMBINDEX, TROPONINI in the  last 168 hours. BNP (last 3 results) No results for input(s): PROBNP in the last 8760 hours. HbA1C: No results for input(s): HGBA1C in the last 72 hours. CBG: Recent Labs  Lab 12/22/18 1416  GLUCAP 101*   Lipid Profile: No results for input(s): CHOL, HDL, LDLCALC, TRIG, CHOLHDL, LDLDIRECT in the last 72 hours. Thyroid Function Tests: No results for input(s): TSH, T4TOTAL, FREET4, T3FREE, THYROIDAB in the last 72 hours. Anemia Panel: No results for input(s): VITAMINB12, FOLATE, FERRITIN, TIBC, IRON, RETICCTPCT in the last 72 hours. Urine analysis:    Component Value Date/Time   COLORURINE AMBER (A) 12/23/2018 0258   APPEARANCEUR CLOUDY (A) 12/23/2018 0258   LABSPEC 1.032 (H) 12/23/2018 0258   PHURINE 5.0 12/23/2018 0258   GLUCOSEU NEGATIVE 12/23/2018 0258   HGBUR NEGATIVE 12/23/2018 0258   BILIRUBINUR NEGATIVE 12/23/2018 0258   KETONESUR NEGATIVE 12/23/2018 0258   PROTEINUR 30 (A) 12/23/2018 0258   NITRITE NEGATIVE 12/23/2018 0258   LEUKOCYTESUR NEGATIVE 12/23/2018 0258   Sepsis Labs: @LABRCNTIP (procalcitonin:4,lacticidven:4) ) Recent Results (from the past 240 hour(s))  SARS Coronavirus 2 (CEPHEID - Performed in [place] hospital lab), Hosp Order     Status: None   Collection Time: 12/19/18  3:41 AM  Result Value Ref Range Status   SARS Coronavirus 2 NEGATIVE NEGATIVE Final    Comment: (NOTE) If result is NEGATIVE SARS-CoV-2 target nucleic acids are NOT DETECTED. The SARS-CoV-2 RNA is generally detectable in upper and lower  respiratory specimens during the acute phase of infection. The lowest  concentration of SARS-CoV-2 viral copies this assay can detect is 250  copies / mL. A negative result does not preclude SARS-CoV-2 infection  and should not be used as the sole basis for treatment or other  patient management decisions.  A negative result may occur with  improper specimen collection /  handling, submission of specimen other  than nasopharyngeal swab, presence of viral mutation(s) within the  areas targeted by this assay, and inadequate number of viral copies  (<250 copies / mL). A negative result must be combined with clinical  observations, patient history, and epidemiological information. If result is POSITIVE SARS-CoV-2 target nucleic acids are DETECTED. The SARS-CoV-2 RNA is generally detectable in upper and lower  respiratory specimens dur ing the acute phase of infection.  Positive  results are indicative of active infection with SARS-CoV-2.  Clinical  correlation with patient history and other diagnostic information is  necessary to determine patient infection status.  Positive results do  not rule out bacterial infection or co-infection with other viruses. If result is PRESUMPTIVE POSTIVE SARS-CoV-2 nucleic acids MAY BE PRESENT.   A presumptive positive result was obtained on the submitted specimen  and confirmed on repeat testing.  While 2019 novel coronavirus  (SARS-CoV-2) nucleic acids may be present in the submitted sample  additional confirmatory testing may be necessary for epidemiological  and / or clinical management purposes  to differentiate between  SARS-CoV-2 and other Sarbecovirus currently known to infect humans.  If clinically indicated additional testing with an alternate test  methodology [phone]) is advised. The SARS-CoV-2 RNA is generally  detectable in upper and lower respiratory sp ecimens during the acute  phase of infection. The expected result is Negative. Fact Sheet for Patients:  StrictlyIdeas.no Fact Sheet for Healthcare Providers: BankingDealers.co.za This test is not yet approved or cleared by the [place] FDA and has been authorized for detection and/or diagnosis of SARS-CoV-2 by FDA under an Emergency Use Authorization (EUA).  This EUA  will remain in effect (meaning this  test can be used) for the duration of the COVID-19 declaration under Section 564(b)(1) of the Act, 21 U.S.C. section 360bbb-3(b)(1), unless the authorization is terminated or revoked sooner. Performed at [facility], [place] [address] [place], [place] [address]   Culture, blood (Routine X 2) w Reflex to ID Panel     Status: None   Collection Time: 12/20/18  4:13 PM  Result Value Ref Range Status   Specimen Description   Final    BLOOD RIGHT HAND Performed at [place] [address] [place], [place] [address]    Special Requests   Final    BOTTLES DRAWN AEROBIC AND ANAEROBIC Blood Culture adequate volume Performed at [place] [address] [place], [place] [address]    Culture   Final    NO GROWTH 5 DAYS Performed at [facility], [place] [address], [place], [place] [address]    Report Status 12/25/2018 FINAL  Final  Culture, blood (Routine X 2) w Reflex to ID Panel     Status: None   Collection Time: 12/20/18  4:19 PM  Result Value Ref Range Status   Specimen Description   Final    BLOOD RIGHT HAND Performed at [place] [address] [place], [place] [address]    Special Requests   Final    BOTTLES DRAWN AEROBIC ONLY Blood Culture results may not be optimal due to an inadequate volume of blood received in culture bottles Performed at [place] [address], [place], [place] [address]    Culture   Final    NO GROWTH 5 DAYS Performed at [facility], [place] [address] [place], [place] [address]    Report Status 12/25/2018 FINAL  Final  Culture, blood (single)     Status: None (Preliminary result)   Collection Time: 12/22/18  4:24 PM  Result Value Ref Range Status   Specimen Description BLOOD LEFT HAND  Final   Special Requests   Final    BOTTLES DRAWN AEROBIC ONLY Blood Culture results may not be optimal due to an inadequate volume of blood received  in culture bottles   Culture   Final    NO GROWTH 4 DAYS Performed at [facility], [place] [address] [place], [place] [address]    Report Status PENDING  Incomplete         Radiology Studies: No results found.    Scheduled Meds: . amiodarone  200 mg Oral Daily  . docusate sodium  100 mg Oral BID  . latanoprost  1 drop Both Eyes QHS  .  morphine injection  2 mg Intravenous Q8H  . nystatin   Topical BID  . [START ON 12/27/2018] predniSONE  50 mg Oral Q breakfast  . sodium chloride flush  3 mL Intravenous Q12H  . sodium chloride flush  3 mL Intravenous Q12H   Continuous Infusions: . sodium chloride       LOS: 6 days      Berle Mull, MD [facility] Pager: [email or web address] Password [facility] 12/26/2018, 7:42 PM

## 2018-12-26 NOTE — Progress Notes (Signed)
[facility])  Received request from [place] for family interest in [place]. Chart reviewed and spoke with Mr. Gerrit Halls by phone to answer his questions and offer support. Plan to follow up with him tomorrow morning re availability.   Please do not hesitate to call with questions.   Thank you,  Erling Conte, LCSW [phone]  [place] are listed daily on AMION under Hospice and [place]

## 2018-12-26 NOTE — Progress Notes (Signed)
Attempted to feed patient. Took 2 bites of oatmeal, spit out banana and potato. Stated she only wanted to drink OJ.

## 2018-12-27 LAB — CULTURE, BLOOD (SINGLE): Culture: NO GROWTH

## 2018-12-27 MED ORDER — MORPHINE SULFATE (PF) 2 MG/ML IV SOLN: 2.0000 mg | Freq: Three times a day (TID) | INTRAVENOUS | 0 refills | Status: AC

## 2018-12-27 NOTE — Discharge Planning (Signed)
Patient discharged to SNF in stable condition.  

## 2018-12-27 NOTE — Plan of Care (Signed)
  Problem: Clinical Measurements: Goal: Respiratory complications will improve Outcome: Progressing Goal: Cardiovascular complication will be avoided Outcome: Progressing   Problem: Skin Integrity: Goal: Risk for impaired skin integrity will decrease Outcome: Progressing   Problem: Respiratory: Goal: Verbalizations of increased ease of respirations will increase Outcome: Progressing   Problem: Role Relationship: Goal: Family's ability to cope with current situation will improve Outcome: Progressing Goal: Ability to verbalize concerns, feelings, and thoughts to partner or family member will improve Outcome: Progressing

## 2018-12-27 NOTE — Consult Note (Signed)
   [facility] CM Inpatient Consult   12/27/2018  [name] 1931/11/15 [id number]    Patient screened for potential [facility] CM service needs as benefit from [facility] plan with an extreme high risk of 30% for unplanned readmissions; has 2 hospitalizations in the past 6 months and a 30 day readmission.  Patient's primary care provider is Dr. [name] with [facility].  Medical record review shows: As per  History and Physical dictated on admission (12/19/2018), "Kelsey Vanbuskirk Robinette-Hagieis a 85 y.o.femalewith medical history significant ofchronic atrial fibrillation,chronic systolic heart failure, pulmonary hypertension, hyperlipidemia, right leg hematoma, recent PE/ pulmonary infarct.Patient presents secondary to 1 day history of worsening shortness of breath. She reports having some shortness of breath for the last week does worsen. She also has some bleeding from her right leg wound for which she receives home health nursing wound care. She reports no chest pain, nausea, vomiting, abdominal pain. She has persistent leg swelling."  (Pulmonary emboli with pulmonary infarct/ acute respiratory failure- unable to anticoagulate due to calf hematomas bilaterally she has received an IVC filter by vascular surgery and had an Aortogram with b/l runoff due to PVD) Discussed with CCM patient not a candidate for any aggressive intervention, patient already DNR/DNI.   Chart review show that Palliative care consulted, and after family meeting on 12/25/2018 family decided transition to complete comfort, with plan to discharge at residential hospice facility. Prognosis less than 2 weeks, currently stable for transfer whenever a bed is available per MD note.    Transition of care SW note states that patient will be transitioning/ has a bed at [facility].  There are no identified [facility] follow up needs at this point, patient will have full care  under Hospice facility.    For questions and additional information, please call:  Macallister Ashmead A. Railey Glad, BSN, RN-BC [facility] Liaison Cell: [phone]

## 2018-12-27 NOTE — Social Work (Signed)
Clinical Social Worker facilitated patient discharge including contacting patient family and facility to confirm patient discharge plans.  Clinical information faxed to facility and family agreeable with plan.  CSW arranged ambulance transport via [facility] to [facility] RN to call [phone]  with report prior to discharge.  Clinical Social Worker will sign off for now as social work intervention is no longer needed. Please consult us again if new need arises.  Danuta Huseman, MSW, LCSWA Clinical Social Worker [phone]  

## 2018-12-27 NOTE — Discharge Summary (Addendum)
[facility] Discharge Summary   Patient: Kelsey [name] LDJ:[id number]   PCP: [name], MD DOB: [birth year]   Date of admission: 12/19/2018   Date of discharge:  12/27/2018    Discharge Diagnoses:  Principal Problem:   Pulmonary emboli [facility]) Active Problems:   Hypertension   Pulmonary HTN (HCC)   Chronic atrial fibrillation with RVR   Chronic systolic CHF (congestive heart failure) ([place])   Hematoma of lower leg   Sepsis ([place])   Palliative care by specialist   Goals of care, counseling/discussion   Advanced care planning/counseling discussion   Admitted From: home Disposition:  residential hospice   Recommendations for Outpatient Follow-up:  1. Please manage meds per hospice   Diet recommendation: comfort feeds  Activity: The patient is advised to gradually reintroduce usual activities.  Discharge Condition: good  Code Status: DNR DNI  History of present illness: As per the H and P dictated on admission, " Kelsey [name] is a 85 y.o. female with medical history significant of chronic atrial fibrillation, chronic systolic heart failure, pulmonary hypertension, hyperlipidemia, right leg hematoma, recent PE/pulmonary infarct.  Patient presents secondary to 1 day history of worsening shortness of breath.  She reports having some shortness of breath for the last week does worsen.  She also has some bleeding from her right leg wound for which she receives home health nursing wound care.  She reports no chest pain, nausea, vomiting, abdominal pain.  She has persistent leg swelling. "  Hospital Course:  Summary of her active problems in the hospital is as following. Pulmonary emboli with pulmonary infarct/ acute respiratory failure unable to anticoagulate due to calf hematomas bilaterally she has received an IVC filter by vascular surgery and had an Aortogram with b/l runoff due to PVD- see below Postprocedure patient was started on IV  heparin. Hemoglobin dropped to 7.7 with hypotension. this is a significant contraindication for the patient for anticoagulation. Discontinue all anticoagulation as well as antiplatelet medication.  Shock. Hemorrhagic versus septic. Blood pressure dropped to 50s.  Heart rate 120s. Patient went into A. fib with RVR. Patient also was febrile. Suspect this is actually more likely hemorrhagic shock again septic shock. CT abdomen pelvis performed on 12/23/2018 shows no evidence of significant hematoma.  Mild groin hematoma which does not explain hemorrhagic soft as a sole etiology although patient did respond to 3 units emergency blood transfusion.  Discussed with CCM patient not a candidate for any aggressive intervention  patient already DNR/DNI.   Prognosis is significantly poor and therefore recommending no pressors. Patient was given emergent transfusion after receiving consent from her husband as patient was having a life-threatening emergency. After the blood transfusion patient showed improvement in her mentation although blood pressure continues to remain low. Patient received total of 3 PRBC transfusion with improvement in hemoglobin up to 11. Remains hypotensive for now.  Goals of care discussion. Not a great situation here. Patient has a history of A. fib as well as PE but not a great candidate for anticoagulation. Any attempt of anticoagulation has resulted into worsening hematoma as well as bleeding. CT abdomen and pelvis is negative for retroperitoneal hematoma and any evidence of significant bleeding.  Patient has been 3 PRBC with improvement in hemoglobin but no improvement in her blood pressure. This support septic shock as possible etiology  Urine output worsening.  Renal function worsening.  Liver function worsening. I recommended her to consider option of comfort care and palliative care as the patient's prognosis  is poor. Highly appreciate palliative care's assistance.   After family meeting on 12/25/2018 family decided transient complete comfort.  Plan is to discharge at residential hospice facility.  Prognosis less than 2 weeks. Patient currently stable for transfer whenever a bed is available.  A. fib with RVR. Appreciate cardiology assistance. Currently appears her cardiac problems are secondary to other medical etiologies.  Will monitor.  Calf hematoma and severe PVD apparently was a spontaneous hematoma while on anticoagulation in hospital (full dose Lovenox) noted on 5/19 Right SFA stent should allow for increased perfusion to let wound heal vascular surgery has started ASA and Plavix, now on hold due to anemia  Discontinue Unna boots. Not a great candidate for debridement, appreciate vascular surgery assistance.  Fever 102.4 on 5/31 Septic work-up initiated. Follow-up on cultures.  So far negative  Hypertension,   Acute on chronic Systolic CHF (EF 47-09%),   Pulmonary HTN  Hold Coreg, lasix  Chronic atrial fibrillation with RVR Hold Coreg, Amiodarone- tachycardic post op   avoid AC  she was previously on Xarelto   Chronic back pain Chronic steroids. Patient is on chronic prednisone daily. Pt was given stress dose steroids.  On the day of the discharge the patient's symptoms are well controlled, and no other acute medical condition were reported by patient. the patient was felt safe to be discharge at residential hospice  with .  Consultants: Palliative care  Cardiology  Vascular surgery  Procedures: IVC filter placement Stent, right superficial femoral artery  DISCHARGE MEDICATION: Allergies as of 12/27/2018      Reactions   Cardizem [diltiazem Hcl] Swelling   Clonidine Derivatives Swelling   Doxazosin Swelling      Medication List    STOP taking these medications   amiodarone 200 MG tablet Commonly known as:  PACERONE   carvedilol 25 MG tablet Commonly known as:  COREG   docusate sodium 100 MG capsule Commonly  known as:  COLACE   ferrous sulfate 325 (65 FE) MG tablet   furosemide 20 MG tablet Commonly known as:  Lasix   HYDROcodone-acetaminophen 5-325 MG tablet Commonly known as:  NORCO/VICODIN   hydroxypropyl methylcellulose / hypromellose 2.5 % ophthalmic solution Commonly known as:  ISOPTO TEARS / GONIOVISC   latanoprost 0.005 % ophthalmic solution Commonly known as:  XALATAN   neomycin-bacitracin-polymyxin ointment Commonly known as:  NEOSPORIN   predniSONE 10 MG tablet Commonly known as:  DELTASONE     TAKE these medications   morphine 2 MG/ML injection Inject 1 mL (2 mg total) into the vein every 8 (eight) hours.      Allergies  Allergen Reactions   Cardizem [Diltiazem Hcl] Swelling   Clonidine Derivatives Swelling   Doxazosin Swelling   Discharge Instructions    Diet general   Complete by:  As directed    Increase activity slowly   Complete by:  As directed      Discharge Exam: Filed Weights   12/23/18 0500 12/24/18 0359 12/25/18 6283  Weight: 84.2 kg 86.9 kg 86.2 kg   Vitals:   12/25/18 1317 12/27/18 0547  BP: 121/76 131/62  Pulse:  (!) 108  Resp:  20  Temp: 97.7 F (36.5 C) 98.4 F (36.9 C)  SpO2:  90%   General: Appear in mild distress, no Rash; Oral Mucosa Clear, moist. no Abnormal Mass Or lumps Cardiovascular: S1 and S2 Present, no Murmur, Respiratory: increased respiratory effort, Bilateral Air entry present and bilateral  Crackles, no wheezes Abdomen: Bowel Sound present,  Extremities: bilateral  Pedal edema, bilateral  calf tenderness Neurology: lethargic and not oriented to time, place, and person affect emotionally labile.  The results of significant diagnostics from this hospitalization (including imaging, microbiology, ancillary and laboratory) are listed below for reference.    Significant Diagnostic Studies: Dg Chest 2 View  Result Date: 12/19/2018 CLINICAL DATA:  Shortness of breath. Hypoxia. EXAM: CHEST - 2 VIEW COMPARISON:   12/01/2018 FINDINGS: Stable mild cardiomegaly. Aortic atherosclerosis. No evidence of acute infiltrate or pleural effusion. Pulmonary hyperinflation again seen, suspicious for COPD. Bilateral breast implants noted with capsular calcification. IMPRESSION: Stable mild cardiomegaly and probable COPD. No active lung disease. Electronically Signed   By: Earle Gell M.D.   On: 12/19/2018 01:52   Ct Angio Chest Pe W And/or Wo Contrast  Result Date: 12/19/2018 CLINICAL DATA:  Hypoxia EXAM: CT ANGIOGRAPHY CHEST WITH CONTRAST TECHNIQUE: Multidetector CT imaging of the chest was performed using the standard protocol during bolus administration of intravenous contrast. Multiplanar CT image reconstructions and MIPs were obtained to evaluate the vascular anatomy. CONTRAST:  171m OMNIPAQUE IOHEXOL 350 MG/ML SOLN COMPARISON:  Chest radiographs dated 12/19/2018 FINDINGS: Cardiovascular: Satisfactory opacification of the bilateral pulmonary arteries to the segmental level. Segmental/subsegmental right lower lobe pulmonary emboli (series 5/images 137, 147, and 160). Overall clot burden is mild. No evidence of right heart strain. No evidence of thoracic aortic aneurysm. Atherosclerotic calcifications of the aortic arch. Cardiomegaly.  No pericardial effusion. Mild coronary atherosclerosis of the LAD and right coronary artery. Mediastinum/Nodes: Calcified subcarinal nodes which do not meet pathologic CT size criteria. Visualized thyroid is unremarkable. Lungs/Pleura: Evaluation of the lung parenchyma is constrained by respiratory motion. Within that constraint, there are no suspicious pulmonary nodules. Biapical pleural-parenchymal scarring. Moderate centrilobular and paraseptal emphysematous changes, upper lung predominant. Scattered calcified granulomata throughout the lungs bilaterally. Stable patchy subpleural right basilar opacity, unchanged from recent CT abdomen/pelvis, likely reflecting a pulmonary infarct in this context.  Trace bilateral pleural effusions. No pneumothorax. Upper Abdomen: Visualized upper abdomen is unchanged from recent CT. Musculoskeletal: Bilateral breast augmentation. Stable mild superior endplate compression fracture deformity at T11. Multilevel degenerative changes of the visualized thoracolumbar spine. Review of the MIP images confirms the above findings. IMPRESSION: Segmental/subsegmental right lower lobe pulmonary emboli, as above. Overall clot burden is mild. No evidence of right heart strain. Critical Value/emergent results were called by telephone at the time of interpretation on 12/19/2018 at 7:20 am to Dr. DRipley Fraise, who verbally acknowledged these results. Suspected pulmonary infarct in the posterior right lower lobe, unchanged from recent CT abdomen/pelvis. Trace bilateral pleural effusions. Aortic Atherosclerosis (ICD10-I70.0) and Emphysema (ICD10-J43.9). Electronically Signed   By: SJulian HyM.D.   On: 12/19/2018 07:20   Ct Angio Low Extrem Right W &/or Wo Contrast  Addendum Date: 12/07/2018   ADDENDUM REPORT: 12/07/2018 22:49 ADDENDUM: Study discussed by telephone with NP XENIA BLOUNT on 12/07/2018 at 1043 hours. Electronically Signed   By: HGenevie AnnM.D.   On: 12/07/2018 22:49   Result Date: 12/07/2018 CLINICAL DATA:  85year old female with right medial calf hematoma after blunt trauma. Possible active bleeding on ultrasound earlier today. EXAM: CT ANGIOGRAPHY OF THE RIGHT LOWEREXTREMITY TECHNIQUE: Multidetector CT imaging of the right lowerwas performed using the standard protocol during bolus administration of intravenous contrast. Multiplanar CT image reconstructions and MIPs were obtained to evaluate the vascular anatomy. CONTRAST:  103mOMNIPAQUE IOHEXOL 350 MG/ML SOLN COMPARISON:  Right lower extremity ultrasound earlier today. FINDINGS: RIGHT LOWER EXTREMITY: Large superficial mixed  density collection with a dependent hematocrit level (series 5, image 416) encompasses 64 x  133 x 144 millimeters (AP by transverse by CC) for an estimated hematoma volume of 613 milliliters. See also series 8, image 89. Superimposed right lower extremity subcutaneous stranding. No superimposed acute osseous abnormality identified. Right hip arthroplasty with streak artifact in the right pelvis and proximal right lower extremity. No knee joint effusion. No soft tissue gas. VASCULAR: Aortoiliac calcified atherosclerosis. The visible bilateral iliac arteries are patent. There is no right iliac stenosis. The right common femoral artery is patent with calcified plaque, but the right SFA appears occluded just beyond the bifurcation (series 5, image 128). Calcified plaque continues in the SFA and the right profundus femoral artery. The right profundal remains patent (series 5, image 170). The SFA remains occluded until reconstituted just cephalad of the popliteal fossa on image 232. Popliteal artery atherosclerosis with at least moderate stenosis (image 276). Superimposed calcified peripheral vascular disease with 2 vessel runoff, the posterior tibial artery is occluded just beyond its origin on series 5, image 376. There is no active extravasation of contrast identified. Review of the MIP images confirms the above findings. NONVASCULAR: Negative visible lower abdominal and pelvic viscera. IMPRESSION: 1. Large superficial hematoma in the right calf with estimated blood volume 613 mL. No active extravasation of contrast identified. No associated acute osseous abnormality. 2. Occlusion of the Right SFA just beyond the bifurcation with reconstituted right popliteal artery. Calcified peripheral vascular disease with two vessel runoff, occluded right posterior tibial artery. Electronically Signed: By: Genevie Ann M.D. On: 12/07/2018 22:32   Ct Abdomen Pelvis W Contrast  Result Date: 12/23/2018 CLINICAL DATA:  Blood loss.  Anemia.  Recent stent placement. EXAM: CT ABDOMEN AND PELVIS WITH CONTRAST TECHNIQUE:  Multidetector CT imaging of the abdomen and pelvis was performed using the standard protocol following bolus administration of intravenous contrast. CONTRAST:  57m OMNIPAQUE IOHEXOL 300 MG/ML  SOLN COMPARISON:  CT scan 12/10/2018 FINDINGS: Lower chest: Stable basilar emphysematous changes, pulmonary scarring and numerous scattered calcified granulomas. The heart is mildly enlarged but stable. No pericardial effusion. Stable aortic and coronary artery calcifications. Very small pleural effusions with overlying atelectasis. Hepatobiliary: No focal hepatic lesions or intrahepatic biliary dilatation. Small scattered calcified granulomas are again noted. Gallbladder is grossly normal. No common bile duct dilatation. Pancreas: No mass, inflammation or ductal dilatation. Spleen: Scattered calcified granulomas. Normal size. No focal lesions. Adrenals/Urinary Tract: The adrenal glands are unremarkable. Stable bilateral renal cysts. No worrisome renal lesions or hydronephrosis. Stomach/Bowel: The stomach, duodenum, small bowel and colon are unremarkable. No acute inflammatory changes, mass lesions or obstructive findings. The terminal ileum is normal. The appendix is normal. Vascular/Lymphatic: Advanced atherosclerotic calcifications involving the aorta and iliac arteries. No focal aneurysm or dissection. An IVC filter is noted just below the renal veins. No complicating features. No mesenteric or retroperitoneal mass or adenopathy. Scattered mesenteric and small amount of retroperitoneal fluid. There is also small amount of extraperitoneal pelvic hematoma mainly on the left side. No significant hemorrhage is identified. Reproductive: The uterus and ovaries are unremarkable. Other: Subcutaneous soft tissue swelling/edema/fluid without discrete hematoma. Small simple appearing cyst is noted near the left hip. Musculoskeletal: The bony structures are intact. There are bilateral hip prostheses without complicating features.  Osteoporosis and degenerative lumbar spondylosis with multilevel disc disease and facet disease. Chronic T11 compression fracture. IMPRESSION: 1. Small amount of free abdominal/pelvic fluid but no large mesenteric or retroperitoneal hematoma. There is a small amount of  extraperitoneal hematoma in the left pelvis. No large thigh hematoma. 2. Small bilateral pleural effusions with overlying atelectasis. 3. Calcified granulomas in the liver, spleen and lungs. 4. IVC filter in good position without complicating features. Electronically Signed   By: Marijo Sanes M.D.   On: 12/23/2018 12:31   Ct Abdomen Pelvis W Contrast  Result Date: 12/10/2018 CLINICAL DATA:  Evaluate insulinoma EXAM: CT ABDOMEN AND PELVIS WITH CONTRAST TECHNIQUE: Multidetector CT imaging of the abdomen and pelvis was performed using the standard protocol following bolus administration of intravenous contrast. CONTRAST:  144m OMNIPAQUE IOHEXOL 300 MG/ML SOLN, oral enteric contrast COMPARISON:  05/27/2018 FINDINGS: Lower chest: Bibasilar scarring and/or atelectasis. There is a subpleural, rounded opacity of the right lung base with proximal subsegmental pulmonary embolus (series 3, image 8). Cardiomegaly. Hepatobiliary: No focal liver abnormality is seen. Diffuse parenchymal calcifications in keeping with prior granulomatous infection. Contracted gallbladder. No gallstones, or biliary dilatation. Pancreas: Unremarkable. No pancreatic ductal dilatation or surrounding inflammatory changes. Spleen: Normal in size without focal abnormality. Diffuse parenchymal calcifications in keeping with prior granulomatous infection. Adrenals/Urinary Tract: Adrenal glands are unremarkable. Kidneys are normal, without renal calculi, focal lesion, or hydronephrosis. Bladder is unremarkable. Stomach/Bowel: Stomach is within normal limits. Appendix appears normal. No evidence of bowel wall thickening, distention, or inflammatory changes. Vascular/Lymphatic: Mixed  calcific atherosclerosis. No enlarged abdominal or pelvic lymph nodes. Reproductive: No mass or other abnormality. Other: Small fat containing left-sided spigelian hernia (series 3, image 68). No abdominopelvic ascites. Musculoskeletal: Osteopenia. Status post bilateral total hip arthroplasty. IMPRESSION: 1. There is no pancreatic lesion or other finding suspicious for insulinoma on single phase CT examination of the abdomen or pelvis. Multiphasic contrast enhanced MRI is the test of choice for detection insulinoma. 2. There is a subpleural, rounded opacity of the right lung base with proximal subsegmental pulmonary embolus (series 3, image 8). Findings are consistent with incidental pulmonary embolism and subsegmental infarction. Consider dedicated CT PE protocol examination of the chest for further evaluation of pulmonary embolus burden. 3.  Other chronic and incidental findings as detailed above. These results will be called to the ordering clinician or representative by the Radiologist Assistant, and communication documented in the PACS or zVision Dashboard. Electronically Signed   By: AEddie CandleM.D.   On: 12/10/2018 13:26   Dg Chest Port 1 View  Result Date: 12/22/2018 CLINICAL DATA:  Sepsis EXAM: PORTABLE CHEST 1 VIEW COMPARISON:  12/19/2018, 12/01/2018 FINDINGS: Bilateral diffuse mild interstitial thickening likely chronic. Left basilar airspace disease. No pleural effusion or pneumothorax. Stable cardiomediastinal silhouette. No aggressive osseous lesion. IMPRESSION: Left basilar airspace disease which may reflect atelectasis versus pneumonia. Electronically Signed   By: HKathreen Devoid  On: 12/22/2018 14:19   Dg Chest Port 1 View  Result Date: 12/01/2018 CLINICAL DATA:  85year old female with shortness of breath. EXAM: PORTABLE CHEST 1 VIEW COMPARISON:  Chest radiograph dated 05/27/2018 FINDINGS: There is diffuse interstitial coarsening and chronic bronchitic changes. No focal consolidation,  pleural effusion, or pneumothorax. Stable moderate cardiomegaly. Atherosclerotic calcification of the aortic arch. No acute osseous pathology. Bilateral breast implants. IMPRESSION: 1. No acute cardiopulmonary process. 2. Stable cardiomegaly. Electronically Signed   By: AAnner CreteM.D.   On: 12/01/2018 03:38   [place] Lower Extrem Ltd Soft Tissue Non Vascular  Addendum Date: 12/07/2018   ADDENDUM REPORT: 12/07/2018 20:30 ADDENDUM: These results were called by telephone at the time of interpretation on 12/07/2018 at 8:30 pm to Dr. BKennon Holter, who verbally acknowledged these results.  Electronically Signed   By: Constance Holster M.D.   On: 12/07/2018 20:30   Result Date: 12/07/2018 CLINICAL DATA:  Hematoma EXAM: ULTRASOUND right LOWER EXTREMITY LIMITED TECHNIQUE: Ultrasound examination of the lower extremity soft tissues was performed in the area of clinical concern. COMPARISON:  None. FINDINGS: There is a large 6.3 x 3.4 cm collection in the right medial calf. There are fluid fluid levels within this collection. On the Doppler images, there appears to be evidence of active bleeding into the hematoma. IMPRESSION: Large right lower extremity hematoma with sonographic evidence of active bleeding. Report to be addended once critical findings reported to the ordering physician. Electronically Signed: By: Constance Holster M.D. On: 12/07/2018 20:01   Vas [place] Lower Extremity Venous (dvt)  Result Date: 12/21/2018  Lower Venous Study Risk Factors: Confirmed PE. Limitations: Patient in pain/ low tolerance for compressions. Comparison Study: 12/10/18 negative Performing Technologist: June Leap RDMS, RVT  Examination Guidelines: A complete evaluation includes B-mode imaging, spectral Doppler, color Doppler, and power Doppler as needed of all accessible portions of each vessel. Bilateral testing is considered an integral part of a complete examination. Limited examinations for reoccurring indications may be performed  as noted.  +---------+---------------+---------+-----------+----------+--------------+  RIGHT     Compressibility Phasicity Spontaneity Properties Summary         +---------+---------------+---------+-----------+----------+--------------+  CFV       Full            Yes       Yes                                    +---------+---------------+---------+-----------+----------+--------------+  SFJ       Full                                                             +---------+---------------+---------+-----------+----------+--------------+  FV Prox   Full                                                             +---------+---------------+---------+-----------+----------+--------------+  FV Mid    Full                                                             +---------+---------------+---------+-----------+----------+--------------+  FV Distal Full                                                             +---------+---------------+---------+-----------+----------+--------------+  PFV       Full                                                             +---------+---------------+---------+-----------+----------+--------------+  POP                       Yes       Yes                                    +---------+---------------+---------+-----------+----------+--------------+  PTV                                                        Not visualized  +---------+---------------+---------+-----------+----------+--------------+  PERO                                                       Not visualized  +---------+---------------+---------+-----------+----------+--------------+ Due to patient's very tender legs, unable to adequately image calf veins.  +---------+---------------+---------+-----------+----------+--------------+  LEFT      Compressibility Phasicity Spontaneity Properties Summary         +---------+---------------+---------+-----------+----------+--------------+  CFV       Full            Yes        Yes                                    +---------+---------------+---------+-----------+----------+--------------+  SFJ       Full                                                             +---------+---------------+---------+-----------+----------+--------------+  FV Prox   Full                                                             +---------+---------------+---------+-----------+----------+--------------+  FV Mid    Full                                                             +---------+---------------+---------+-----------+----------+--------------+  FV Distal Full                                                             +---------+---------------+---------+-----------+----------+--------------+  PFV       Full                                                             +---------+---------------+---------+-----------+----------+--------------+  POP                       Yes       Yes                                    +---------+---------------+---------+-----------+----------+--------------+  PTV                                                        Not visualized  +---------+---------------+---------+-----------+----------+--------------+  PERO                                                       Not visualized  +---------+---------------+---------+-----------+----------+--------------+ Due to patient's very tender legs, unable to adequately image calf veins.    Summary: Right: There is no evidence of deep vein thrombosis in the lower extremity. However, portions of this examination were limited- see technologist comments above. No cystic structure found in the popliteal fossa. Left: There is no evidence of deep vein thrombosis in the lower extremity. However, portions of this examination were limited- see technologist comments above. A cystic structure is found in the popliteal fossa.  *See table(s) above for measurements and observations. Electronically signed by Deitra Mayo MD on  12/21/2018 at 12:57:04 PM.    Final    Vas [place] Lower Extremity Venous (dvt)  Result Date: 12/10/2018  Lower Venous Study Indications: Swelling.  Risk Factors: Confirmed PE. Limitations: Patient intolerance to compressions. Performing Technologist: June Leap RDMS, RVT  Examination Guidelines: A complete evaluation includes B-mode imaging, spectral Doppler, color Doppler, and power Doppler as needed of all accessible portions of each vessel. Bilateral testing is considered an integral part of a complete examination. Limited examinations for reoccurring indications may be performed as noted.  +---------+---------------+---------+-----------+----------+--------------+  RIGHT     Compressibility Phasicity Spontaneity Properties Summary         +---------+---------------+---------+-----------+----------+--------------+  CFV       Full            Yes       Yes                                    +---------+---------------+---------+-----------+----------+--------------+  SFJ       Full                                                             +---------+---------------+---------+-----------+----------+--------------+  FV Prox   Full                                                             +---------+---------------+---------+-----------+----------+--------------+  FV Mid  Full                                                             +---------+---------------+---------+-----------+----------+--------------+  FV Distal Full                                                             +---------+---------------+---------+-----------+----------+--------------+  PFV       Full                                                             +---------+---------------+---------+-----------+----------+--------------+  POP                       Yes       Yes                                    +---------+---------------+---------+-----------+----------+--------------+  PTV                                                         Not visualized  +---------+---------------+---------+-----------+----------+--------------+  PERO                                                       Not visualized  +---------+---------------+---------+-----------+----------+--------------+   +---------+---------------+---------+-----------+----------+--------------+  LEFT      Compressibility Phasicity Spontaneity Properties Summary         +---------+---------------+---------+-----------+----------+--------------+  CFV       Full            Yes       Yes                                    +---------+---------------+---------+-----------+----------+--------------+  SFJ       Full                                                             +---------+---------------+---------+-----------+----------+--------------+  FV Prox   Full                                                             +---------+---------------+---------+-----------+----------+--------------+  FV Mid    Full                                                             +---------+---------------+---------+-----------+----------+--------------+  FV Distal Full                                                             +---------+---------------+---------+-----------+----------+--------------+  PFV       Full                                                             +---------+---------------+---------+-----------+----------+--------------+  POP                       Yes       Yes                                    +---------+---------------+---------+-----------+----------+--------------+  PTV                                                        Not visualized  +---------+---------------+---------+-----------+----------+--------------+  PERO                                                       Not visualized  +---------+---------------+---------+-----------+----------+--------------+     Summary: Right: There is no evidence of deep vein thrombosis in the lower extremity. However,  portions of this examination were limited- see technologist comments above. Left: There is no evidence of deep vein thrombosis in the lower extremity. However, portions of this examination were limited- see technologist comments above.  *See table(s) above for measurements and observations. Electronically signed by Deitra Mayo MD on 12/10/2018 at 3:43:29 PM.    Final    Vas [place] Upper Extremity Venous Duplex  Result Date: 12/24/2018 UPPER VENOUS STUDY  Indications: Pain, and Erythema Limitations: Patient low tolerance to compressions due to pain. Comparison Study: no previous UE duplex Performing Technologist: June Leap RDMS, RVT  Examination Guidelines: A complete evaluation includes B-mode imaging, spectral Doppler, color Doppler, and power Doppler as needed of all accessible portions of each vessel. Bilateral testing is considered an integral part of a complete examination. Limited examinations for reoccurring indications may be performed as noted.  Right Findings: +----------+------------+---------+-----------+----------+-------+  RIGHT      Compressible Phasicity Spontaneous Properties Summary  +----------+------------+---------+-----------+----------+-------+  IJV            Full        Yes  Yes                         +----------+------------+---------+-----------+----------+-------+  Subclavian     Full        Yes        Yes                         +----------+------------+---------+-----------+----------+-------+  Axillary       Full        Yes        Yes                         +----------+------------+---------+-----------+----------+-------+  Brachial       Full        Yes        Yes                         +----------+------------+---------+-----------+----------+-------+  Radial         Full                                               +----------+------------+---------+-----------+----------+-------+  Ulnar          Full                                                +----------+------------+---------+-----------+----------+-------+  Cephalic       Full                                               +----------+------------+---------+-----------+----------+-------+  Basilic        Full                                               +----------+------------+---------+-----------+----------+-------+  Left Findings: +----------+------------+---------+-----------+----------+-------+  LEFT       Compressible Phasicity Spontaneous Properties Summary  +----------+------------+---------+-----------+----------+-------+  Subclavian                 Yes        Yes                         +----------+------------+---------+-----------+----------+-------+  Summary:  Right: No evidence of deep vein thrombosis in the upper extremity. No evidence of superficial vein thrombosis in the upper extremity.  Left: No evidence of thrombosis in the subclavian.  *See table(s) above for measurements and observations.  Diagnosing physician: Servando Snare MD Electronically signed by Servando Snare MD on 12/24/2018 at 12:22:09 PM.    Final    Irpicc Placement Left >5 [place] Img Guide  Result Date: 12/03/2018 INDICATION: Poor venous access. In need of durable intravenous access for medication administration and blood draws while hospitalized. EXAM: ULTRASOUND AND FLUOROSCOPIC GUIDED PICC LINE INSERTION MEDICATIONS: None. CONTRAST:  None FLUOROSCOPY TIME:  18 seconds (0.7 mGy) COMPLICATIONS: None immediate. TECHNIQUE: The procedure, risks, benefits, and  alternatives were explained to the patient and informed written consent was obtained. A timeout was performed prior to the initiation of the procedure. The right upper extremity was prepped with chlorhexidine in a sterile fashion, and a sterile drape was applied covering the operative field. Maximum barrier sterile technique with sterile gowns and gloves were used for the procedure. A timeout was performed prior to the initiation of the procedure. Local  anesthesia was provided with 1% lidocaine. Under direct ultrasound guidance, the basilic vein was accessed with a micropuncture kit after the overlying soft tissues were anesthetized with 1% lidocaine. After the overlying soft tissues were anesthetized, a small venotomy incision was created and a micropuncture kit was utilized to access the right basilic vein. Real-time ultrasound guidance was utilized for vascular access including the acquisition of a permanent ultrasound image documenting patency of the accessed vessel. A guidewire was advanced to the level of the superior caval-atrial junction for measurement purposes and the PICC line was cut to length. A peel-away sheath was placed and a 38 cm, 5 [place], dual lumen was inserted to level of the superior caval-atrial junction. A post procedure spot fluoroscopic was obtained. The catheter easily aspirated and flushed and was sutured in place. A dressing was placed. The patient tolerated the procedure well without immediate post procedural complication. FINDINGS: After catheter placement, the tip lies within the superior cavoatrial junction. The catheter aspirates and flushes normally and is ready for immediate use. IMPRESSION: Successful ultrasound and fluoroscopic guided placement of a right basilic vein approach, 38 cm, 5 [place], dual lumen PICC with tip at the superior caval-atrial junction. The PICC line is ready for immediate use. Electronically Signed   By: Sandi Mariscal M.D.   On: 12/03/2018 13:45   [place] Ekg Site Rite  Result Date: 12/02/2018 If Site Rite image not attached, placement could not be confirmed due to current cardiac rhythm.   Microbiology: Recent Results (from the past 240 hour(s))  SARS Coronavirus 2 (CEPHEID - Performed in [place] hospital lab), Hosp Order     Status: None   Collection Time: 12/19/18  3:41 AM  Result Value Ref Range Status   SARS Coronavirus 2 NEGATIVE NEGATIVE Final    Comment: (NOTE) If result is  NEGATIVE SARS-CoV-2 target nucleic acids are NOT DETECTED. The SARS-CoV-2 RNA is generally detectable in upper and lower  respiratory specimens during the acute phase of infection. The lowest  concentration of SARS-CoV-2 viral copies this assay can detect is 250  copies / mL. A negative result does not preclude SARS-CoV-2 infection  and should not be used as the sole basis for treatment or other  patient management decisions.  A negative result may occur with  improper specimen collection / handling, submission of specimen other  than nasopharyngeal swab, presence of viral mutation(s) within the  areas targeted by this assay, and inadequate number of viral copies  (<250 copies / mL). A negative result must be combined with clinical  observations, patient history, and epidemiological information. If result is POSITIVE SARS-CoV-2 target nucleic acids are DETECTED. The SARS-CoV-2 RNA is generally detectable in upper and lower  respiratory specimens dur ing the acute phase of infection.  Positive  results are indicative of active infection with SARS-CoV-2.  Clinical  correlation with patient history and other diagnostic information is  necessary to determine patient infection status.  Positive results do  not rule out bacterial infection or co-infection with other viruses. If result is PRESUMPTIVE POSTIVE SARS-CoV-2 nucleic acids MAY  BE PRESENT.   A presumptive positive result was obtained on the submitted specimen  and confirmed on repeat testing.  While 2019 novel coronavirus  (SARS-CoV-2) nucleic acids may be present in the submitted sample  additional confirmatory testing may be necessary for epidemiological  and / or clinical management purposes  to differentiate between  SARS-CoV-2 and other Sarbecovirus currently known to infect humans.  If clinically indicated additional testing with an alternate test  methodology [id number]) is advised. The SARS-CoV-2 RNA is generally  detectable  in upper and lower respiratory sp ecimens during the acute  phase of infection. The expected result is Negative. Fact Sheet for Patients:  StrictlyIdeas.no Fact Sheet for Healthcare Providers: BankingDealers.co.za This test is not yet approved or cleared by the [place] FDA and has been authorized for detection and/or diagnosis of SARS-CoV-2 by FDA under an Emergency Use Authorization (EUA).  This EUA will remain in effect (meaning this test can be used) for the duration of the COVID-19 declaration under Section 564(b)(1) of the Act, 21 U.S.C. section 360bbb-3(b)(1), unless the authorization is terminated or revoked sooner. Performed at [facility], [place] [address] [place], [place] [address]   Culture, blood (Routine X 2) w Reflex to ID Panel     Status: None   Collection Time: 12/20/18  4:13 PM  Result Value Ref Range Status   Specimen Description   Final    BLOOD RIGHT HAND Performed at [place] [address], [place], [place] [address]    Special Requests   Final    BOTTLES DRAWN AEROBIC AND ANAEROBIC Blood Culture adequate volume Performed at [place] [address], [place], [place] [address]    Culture   Final    NO GROWTH 5 DAYS Performed at [facility], [place] [address], [place], [place] [address]    Report Status 12/25/2018 FINAL  Final  Culture, blood (Routine X 2) w Reflex to ID Panel     Status: None   Collection Time: 12/20/18  4:19 PM  Result Value Ref Range Status   Specimen Description   Final    BLOOD RIGHT HAND Performed at [place] [address] [place], [place] [address]    Special Requests   Final    BOTTLES DRAWN AEROBIC ONLY Blood Culture results may not be optimal due to an inadequate volume of blood received in culture bottles Performed at [place] [address]  [place], [place] [address]    Culture   Final    NO GROWTH 5 DAYS Performed at [facility], [place] [address] [place], [place] [address]    Report Status 12/25/2018 FINAL  Final  Culture, blood (single)     Status: None (Preliminary result)   Collection Time: 12/22/18  4:24 PM  Result Value Ref Range Status   Specimen Description BLOOD LEFT HAND  Final   Special Requests   Final    BOTTLES DRAWN AEROBIC ONLY Blood Culture results may not be optimal due to an inadequate volume of blood received in culture bottles   Culture   Final    NO GROWTH 4 DAYS Performed at [facility], [place] [address], [place], [place] [address]    Report Status PENDING  Incomplete     Labs: CBC: Recent Labs  Lab 12/22/18 1346 12/22/18 1630 12/23/18 0716 12/24/18 0723 12/25/18 0438  WBC 2.4* 3.3* 6.1 8.8 11.4*  NEUTROABS  --   --  3.7 7.9*  --   HGB 7.2* 8.1* 11.4* 10.9* 10.8*  HCT 22.8* 25.0* 35.3* 33.6* 33.3*  MCV 104.6* 100.4* 100.3* 97.4 97.1  PLT 132* 118* 104* 93* 88*   Basic Metabolic Panel: Recent Labs  Lab 12/21/18 0535 12/22/18 0306 12/23/18 0716 12/24/18 0723 12/25/18 0438  NA 140 139 140 137 141  K 4.1 3.7 3.9 3.9 4.3  CL 104 105 113* 107 112*  CO2 28 23 16* 19* 20*  GLUCOSE 92 78 174* 127* 112*  BUN 15 16 27* 36* 40*  CREATININE 0.87 0.91 1.12* 0.99 0.94  CALCIUM 8.2* 7.5* 7.3* 7.9* 8.5*   Liver Function Tests: Recent Labs  Lab 12/23/18 0716 12/24/18 0723  AST 18 15  ALT 13 11  ALKPHOS 47 59  BILITOT 1.6* 1.2  PROT 4.9* 4.9*  ALBUMIN 1.8* 1.7*   No results for input(s): LIPASE, AMYLASE in the last 168 hours. No results for input(s): AMMONIA in the last 168 hours. Cardiac Enzymes: No results for input(s): CKTOTAL, CKMB, CKMBINDEX, TROPONINI in the last 168 hours. BNP (last 3 results) Recent Labs    12/01/18 0317 12/19/18 0341  BNP 1,554.3* 486.5*   CBG: Recent Labs  Lab 12/22/18 1416  GLUCAP 101*   Time spent: 35 minutes  Signed:  Berle Mull  [facility]  12/27/2018

## 2018-12-27 NOTE — Social Work (Signed)
Pt has a bed at [facility], confirmed with liaison Harmon Pier. Paperwork set to be done with pt family at 11:30am.   Requested dc summary and signed DNR from MD.  Westley Hummer, MSW, [place] Work [phone]

## 2018-12-27 NOTE — TOC Transition Note (Signed)
Transition of Care [facility]) - CM/SW Discharge Note   Patient Details  Name: Kelsey [name] MRN: [id number] Date of Birth: 02-18-1932  [facility] CM/SW Contact:  Alexander Mt, [place] Phone Number: 12/27/2018, 12:38 PM   Clinical Narrative:    Plan for dc to [facility] as arranged by PMT referral. Pt husband has completed paperwork. PTAR called, DNR signed on chart.   Final next level of care: [place] Barriers to Discharge: Barriers Resolved   Patient Goals and CMS Choice Patient states their goals for this hospitalization and ongoing recovery are:: [facility] CMS Medicare.gov Compare Post Acute Care list provided to:: Patient Represenative (must comment)(spouse) Choice offered to / list presented to : Spouse  Discharge Placement              Patient chooses bed at: Other - please specify in the comment section below: Patient to be transferred to facility by: [place] Name of family member notified: pt husband Patient and family notified of of transfer: 12/27/18  Discharge Plan and Services                                     Social Determinants of Health ([place]) Interventions     Readmission Risk Interventions Readmission Risk Prevention Plan 12/06/2018  Transportation Screening Complete  PCP or Specialist Appt within 3-5 Days Complete  HRI or [place] Complete  Social Work Consult for [place] Planning/Counseling Complete  Palliative Care Screening Not Applicable  Medication Review [employer]) Complete  Some recent data might be hidden

## 2018-12-28 DIAGNOSIS — I87319 Chronic venous hypertension (idiopathic) with ulcer of unspecified lower extremity: Secondary | ICD-10-CM | POA: Diagnosis not present

## 2019-01-03 ENCOUNTER — Other Ambulatory Visit: Payer: Self-pay

## 2019-01-03 DIAGNOSIS — I739 Peripheral vascular disease, unspecified: Secondary | ICD-10-CM

## 2019-01-03 DIAGNOSIS — I872 Venous insufficiency (chronic) (peripheral): Secondary | ICD-10-CM

## 2019-01-06 ENCOUNTER — Ambulatory Visit: Payer: Medicare HMO | Admitting: Vascular Surgery

## 2019-01-06 ENCOUNTER — Encounter (HOSPITAL_COMMUNITY): Payer: Medicare HMO

## 2019-01-06 ENCOUNTER — Inpatient Hospital Stay (HOSPITAL_COMMUNITY): Admission: RE | Admit: 2019-01-06 | Payer: Medicare HMO | Source: Ambulatory Visit

## 2019-01-11 ENCOUNTER — Encounter: Payer: Self-pay | Admitting: Family

## 2019-01-19 DEATH — deceased

## 2019-07-21 IMAGING — MR MR THORACIC SPINE W/O CM
4 of 11 series · 14 of 48 positions shown · non-contrast
Comparison: None.

CLINICAL DATA: Lower body weakness, and spinal stenosis.

EXAM:
MRI THORACIC AND LUMBAR SPINE WITHOUT CONTRAST
TECHNIQUE: Multiplanar and multiecho pulse sequences of the thoracic and lumbar
spine were obtained without intravenous contrast.

[Series 19: T2 · sagittal · 3.5mm · 0.67mm/px · 2 of 13 slices shown (1 of 4)]
[im 1/13]
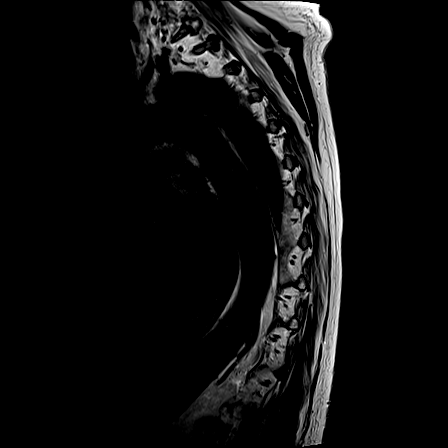
[im 13/13]
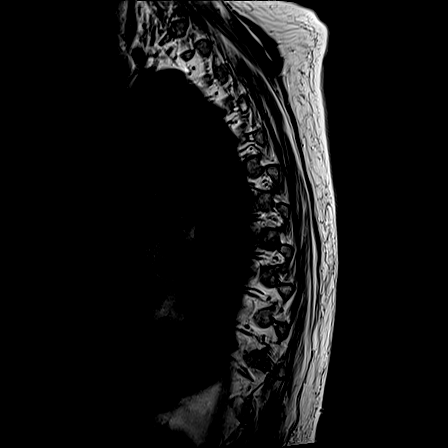

[Series 20: T2 · axial · 4.0mm · 0.28mm/px · z∈[-295,-120]mm · 7 of 43 slices shown (2 of 4)]
[im 1/43]
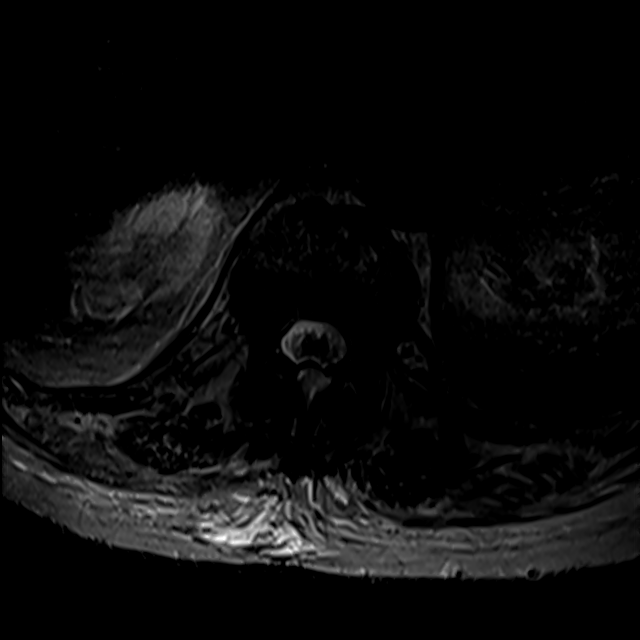
[im 7/43]
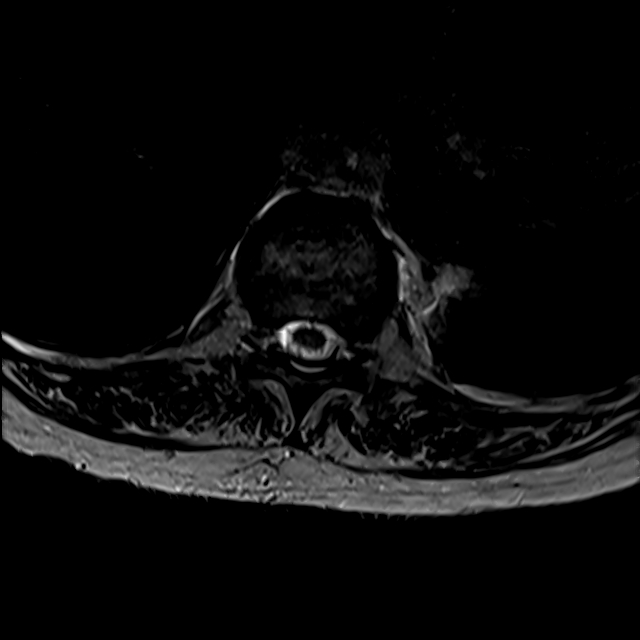
[im 13/43]
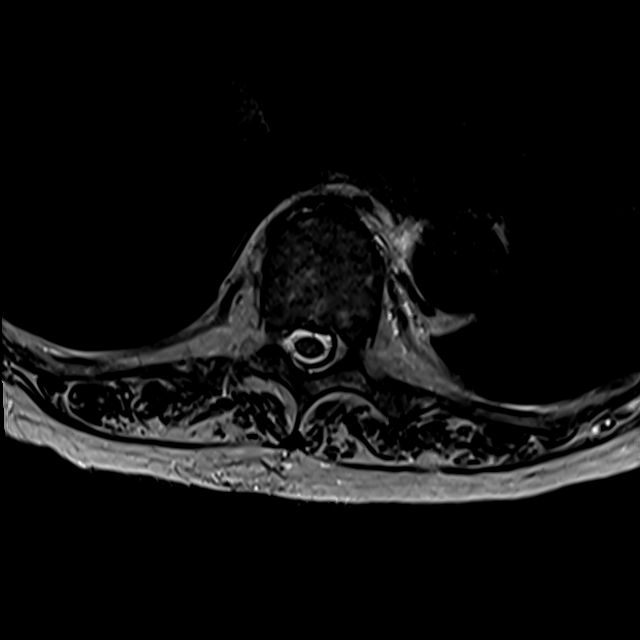
[im 19/43]
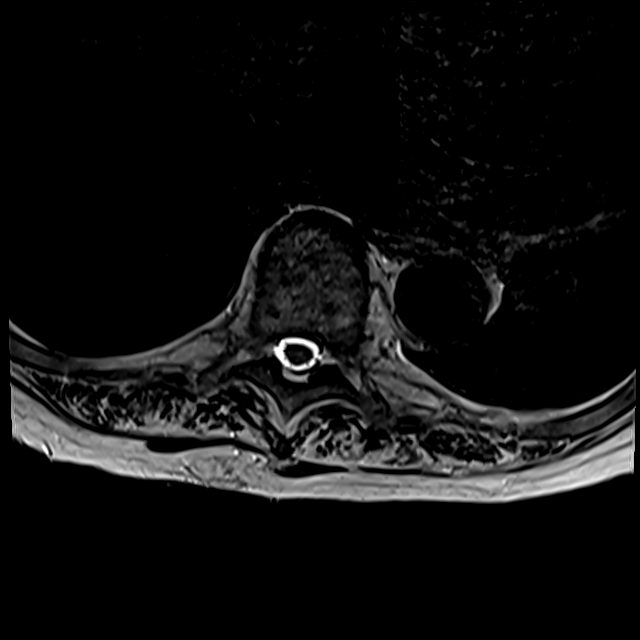
[im 25/43]
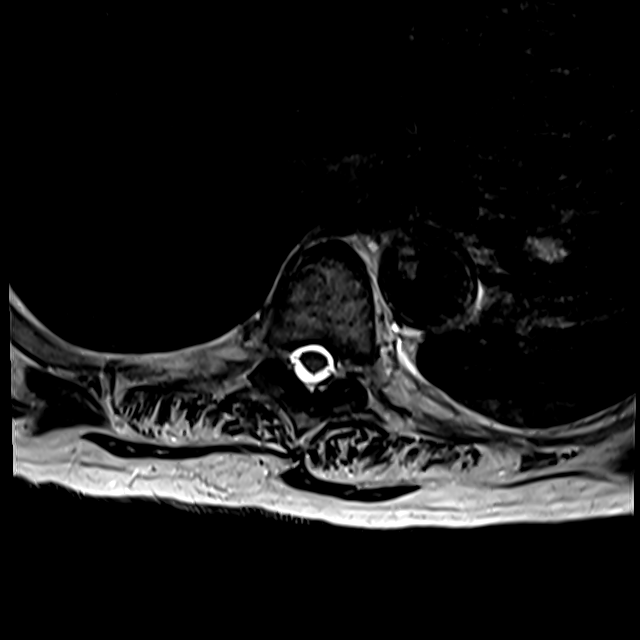
[im 31/43]
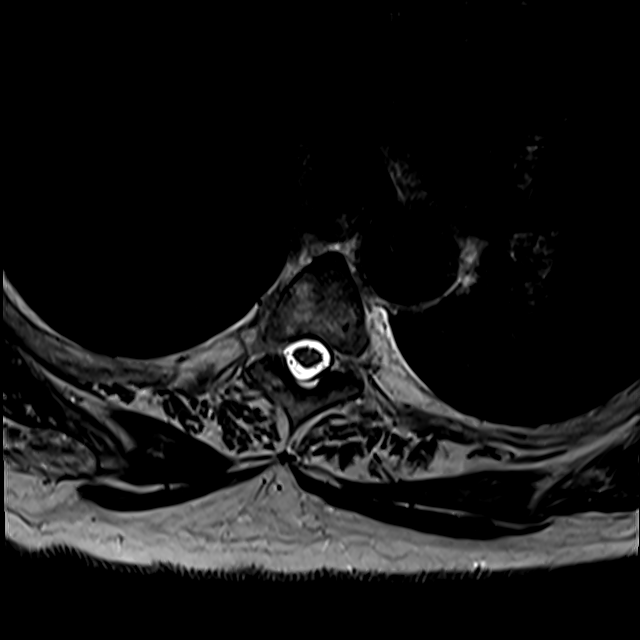
[im 37/43]
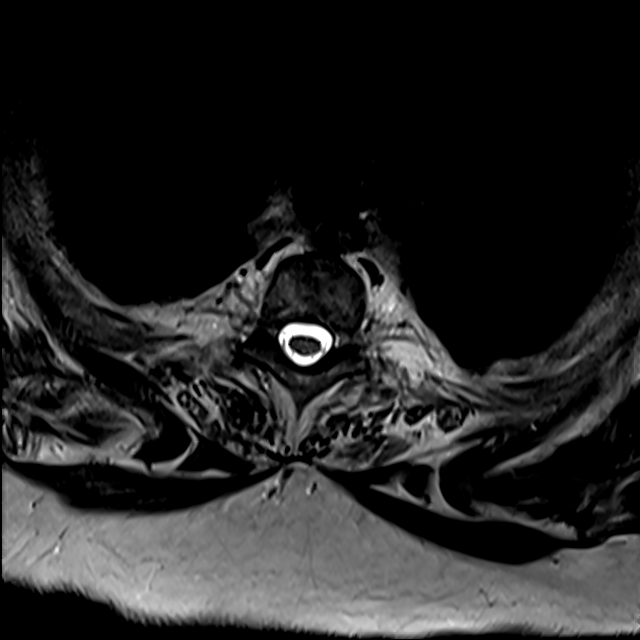

[Series 24: T2 · sagittal · 4.0mm · 0.73mm/px · 2 of 13 slices shown (3 of 4)]
[im 1/13]
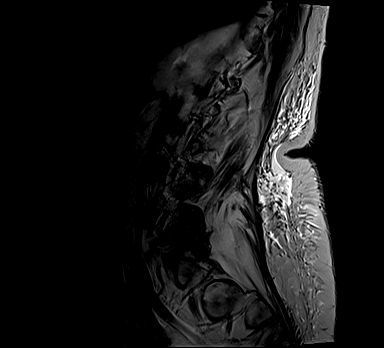
[im 13/13]
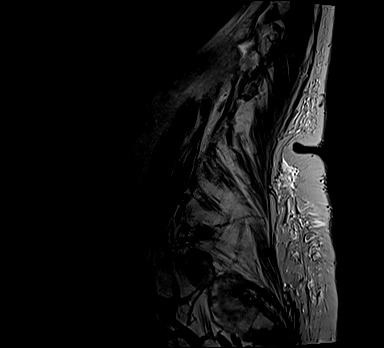

[Series 33: T2 · oblique · 4.0mm · 0.28mm/px · 3 of 41 slices shown (4 of 4)]
[im 6/41]
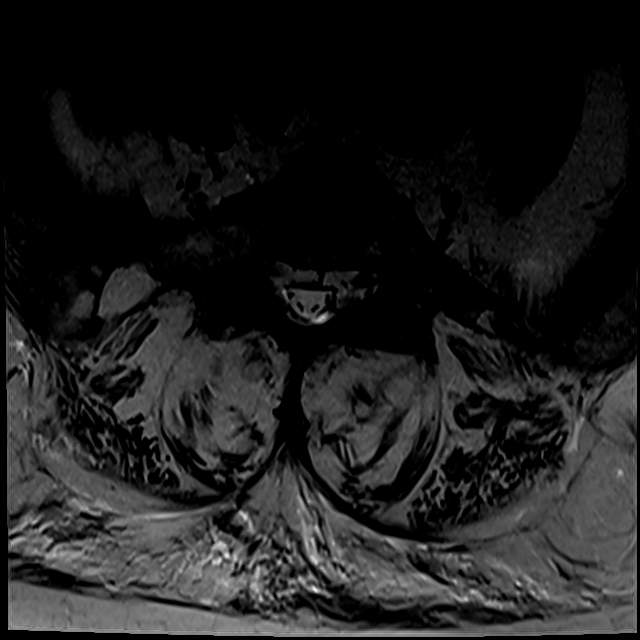
[im 23/41]
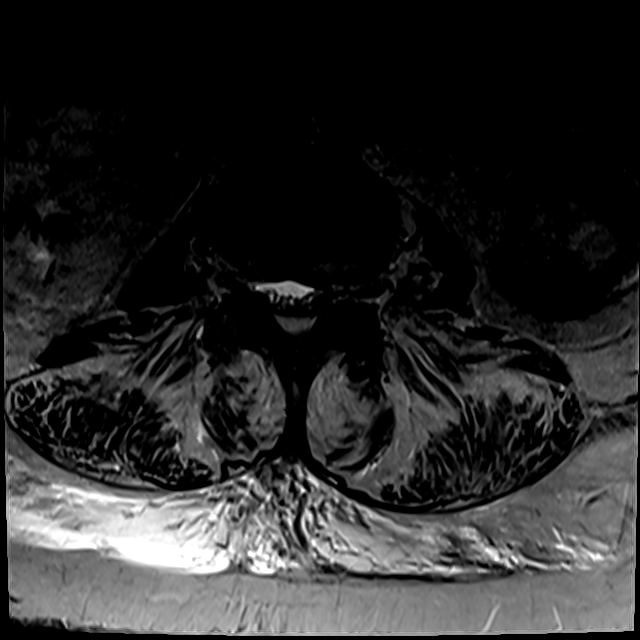
[im 35/41]
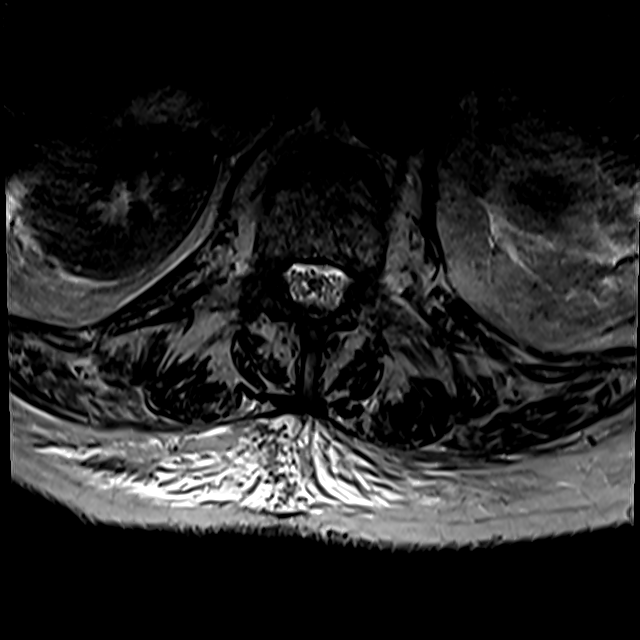

[14 of 48 positions shown; findings below may reference images not displayed]

FINDINGS: MRI THORACIC SPINE FINDINGS

Alignment:  Exaggerated kyphosis, but no subluxation.

Vertebrae: Chronic T11 compression fracture.

Cord: Mild cord flattening, described below, at T9-10, T10-11, and
T12-L1, without abnormal cord signal.

Paraspinal and other soft tissues: Small LEFT effusion. Markedly
enlarged LEFT atrium.

Disc levels:

Rest

The individual disc spaces are examined as follows:

T1-2:  Shallow leftward protrusion.  No impingement.

T2-3:  Central protrusion.  No impingement.

T3-4:  Unremarkable.

T4-5:  Unremarkable.

T5-6:  Unremarkable.

T6-7:  Unremarkable.

T7-8:  Disc space narrowing.  No protrusion.

T8-9:  Disc space narrowing.  Facet arthropathy.  No impingement.

T9-10: Disc space narrowing. Central protrusion. Posterior element
hypertrophy, asymmetric RIGHT ligamentum flavum calcification
resulting in mild cord flattening. No abnormal cord signal.

T10-11: Chronic T11 compression deformity, minimal retropulsion.
Associated central protrusion and facet arthropathy. Minimal cord
flattening without abnormal signal.

T11-12: Annular bulge. Facet arthropathy. No definite impingement.

T12-L1: Advanced disc space narrowing. Trace retrolisthesis. Central
protrusion. Prominent epidural fat. Mild cord flattening. No
abnormal cord signal.

MRI LUMBAR SPINE FINDINGS

Segmentation: Transitional S1. Numbering scheme used previously is
continued.

Alignment: 4 mm retrolisthesis with osseous spurring, at L1-L2. 2 mm
retrolisthesis L2-3.

Vertebrae:  No worrisome osseous lesion.

Conus medullaris and cauda equina: Conus extends to the L1 level.
Conus and cauda equina appear normal.

Paraspinal and other soft tissues: Unremarkable. Renal cystic
disease.

Disc levels:

L1-L2: Advanced disc space narrowing. 4 mm retrolisthesis. Central
extrusion. Posterior element hypertrophy. Mild stenosis.
Subarticular zone and foraminal zone narrowing could affect the L1
and L2 nerve roots.

L2-L3: 2 mm retrolisthesis. Central protrusion. Facet arthropathy.
BILATERAL subarticular zone and foraminal zone narrowing could
affect the L2 and L3 nerve roots.

L3-L4: Normal disc height. Annular bulge. Posterior element
hypertrophy. Prominent epidural fat. Mild stenosis. Asymmetric
foraminal narrowing most pronounced on the LEFT.

L4-L5: Good disc height. Annular bulge. Posterior element
hypertrophy. Prominent epidural fat. Mild stenosis. No definite
subarticular zone narrowing. Moderate BILATERAL foraminal narrowing.
This is worse on the RIGHT.

L5-S1: Disc space narrowing. Central protrusion. Marked facet
arthropathy, worse on the RIGHT. Prominent epidural fat. RIGHT
greater than LEFT L5 and S1 nerve root impingement are likely.
IMPRESSION: MR THORACIC SPINE IMPRESSION

Multilevel spondylosis as described. Stenosis at T9-10, T10-11, and
T12-L1, with mild cord flattening but no abnormal cord signal.
Previously described abnormal cord signal was either artifactual
(favored), or has normalized.

Previous subacute T11 compression fracture has now stabilized to a
chronic appearance. No further loss of height. No bone marrow edema.

MR LUMBAR SPINE IMPRESSION

Multilevel spondylosis, most pronounced at L1-L2. 4 mm
retrolisthesis in conjunction with central protrusion and posterior
element hypertrophy, along with prominent epidural fat at multiple
locations. See discussion above. Similar appearance to priors.

## 2020-01-06 ENCOUNTER — Encounter: Payer: Self-pay | Admitting: Surgery

## 2020-08-15 IMAGING — DX PORTABLE CHEST - 1 VIEW
1 series · 1 of 1 positions shown · non-contrast
Comparison: Chest radiograph dated 05/27/2018

CLINICAL DATA: 86-year-old female with shortness of breath.

EXAM:
PORTABLE CHEST 1 VIEW

[chest ap]
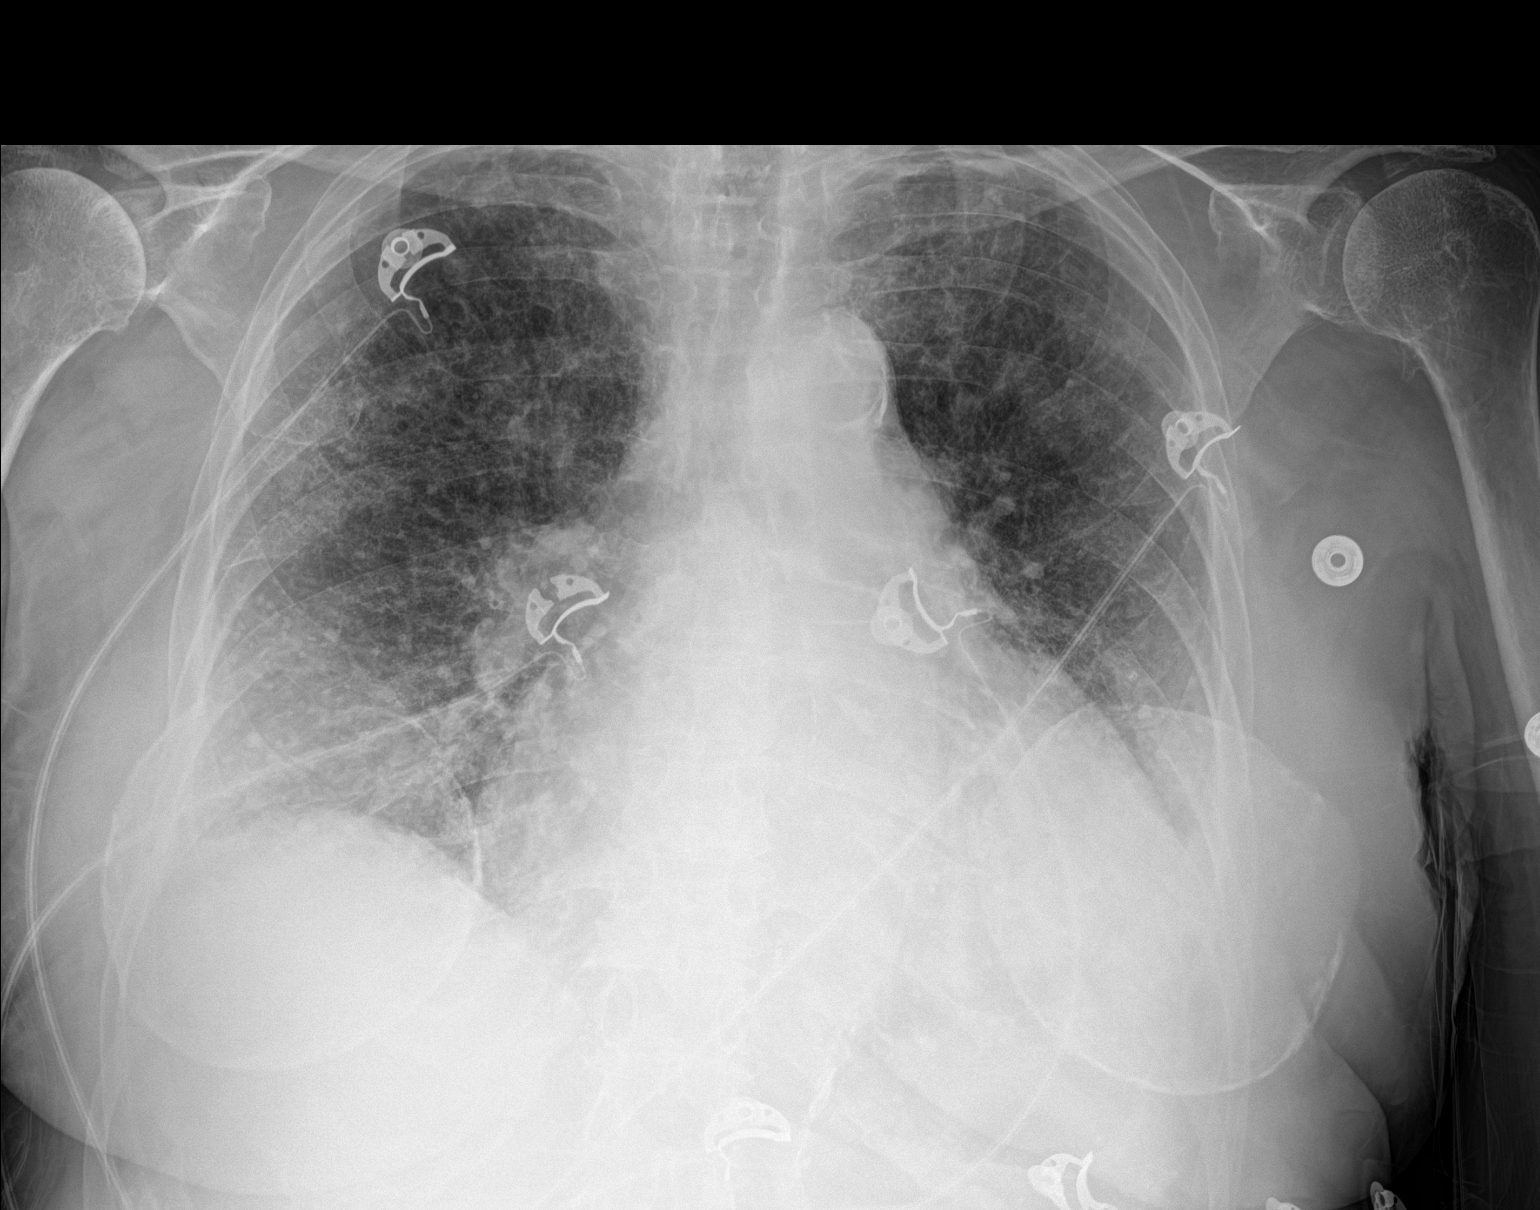

[1 of 1 positions shown; findings below may reference images not displayed]

FINDINGS: There is diffuse interstitial coarsening and chronic bronchitic
changes. No focal consolidation, pleural effusion, or pneumothorax.
Stable moderate cardiomegaly. Atherosclerotic calcification of the
aortic arch. No acute osseous pathology. Bilateral breast implants.
IMPRESSION: 1. No acute cardiopulmonary process.
2. Stable cardiomegaly.

## 2020-09-02 IMAGING — CR CHEST - 2 VIEW
2 series · 2 of 2 positions shown · non-contrast
Comparison: 12/01/2018

CLINICAL DATA: Shortness of breath. Hypoxia.

EXAM:
CHEST - 2 VIEW

[w chest lat]
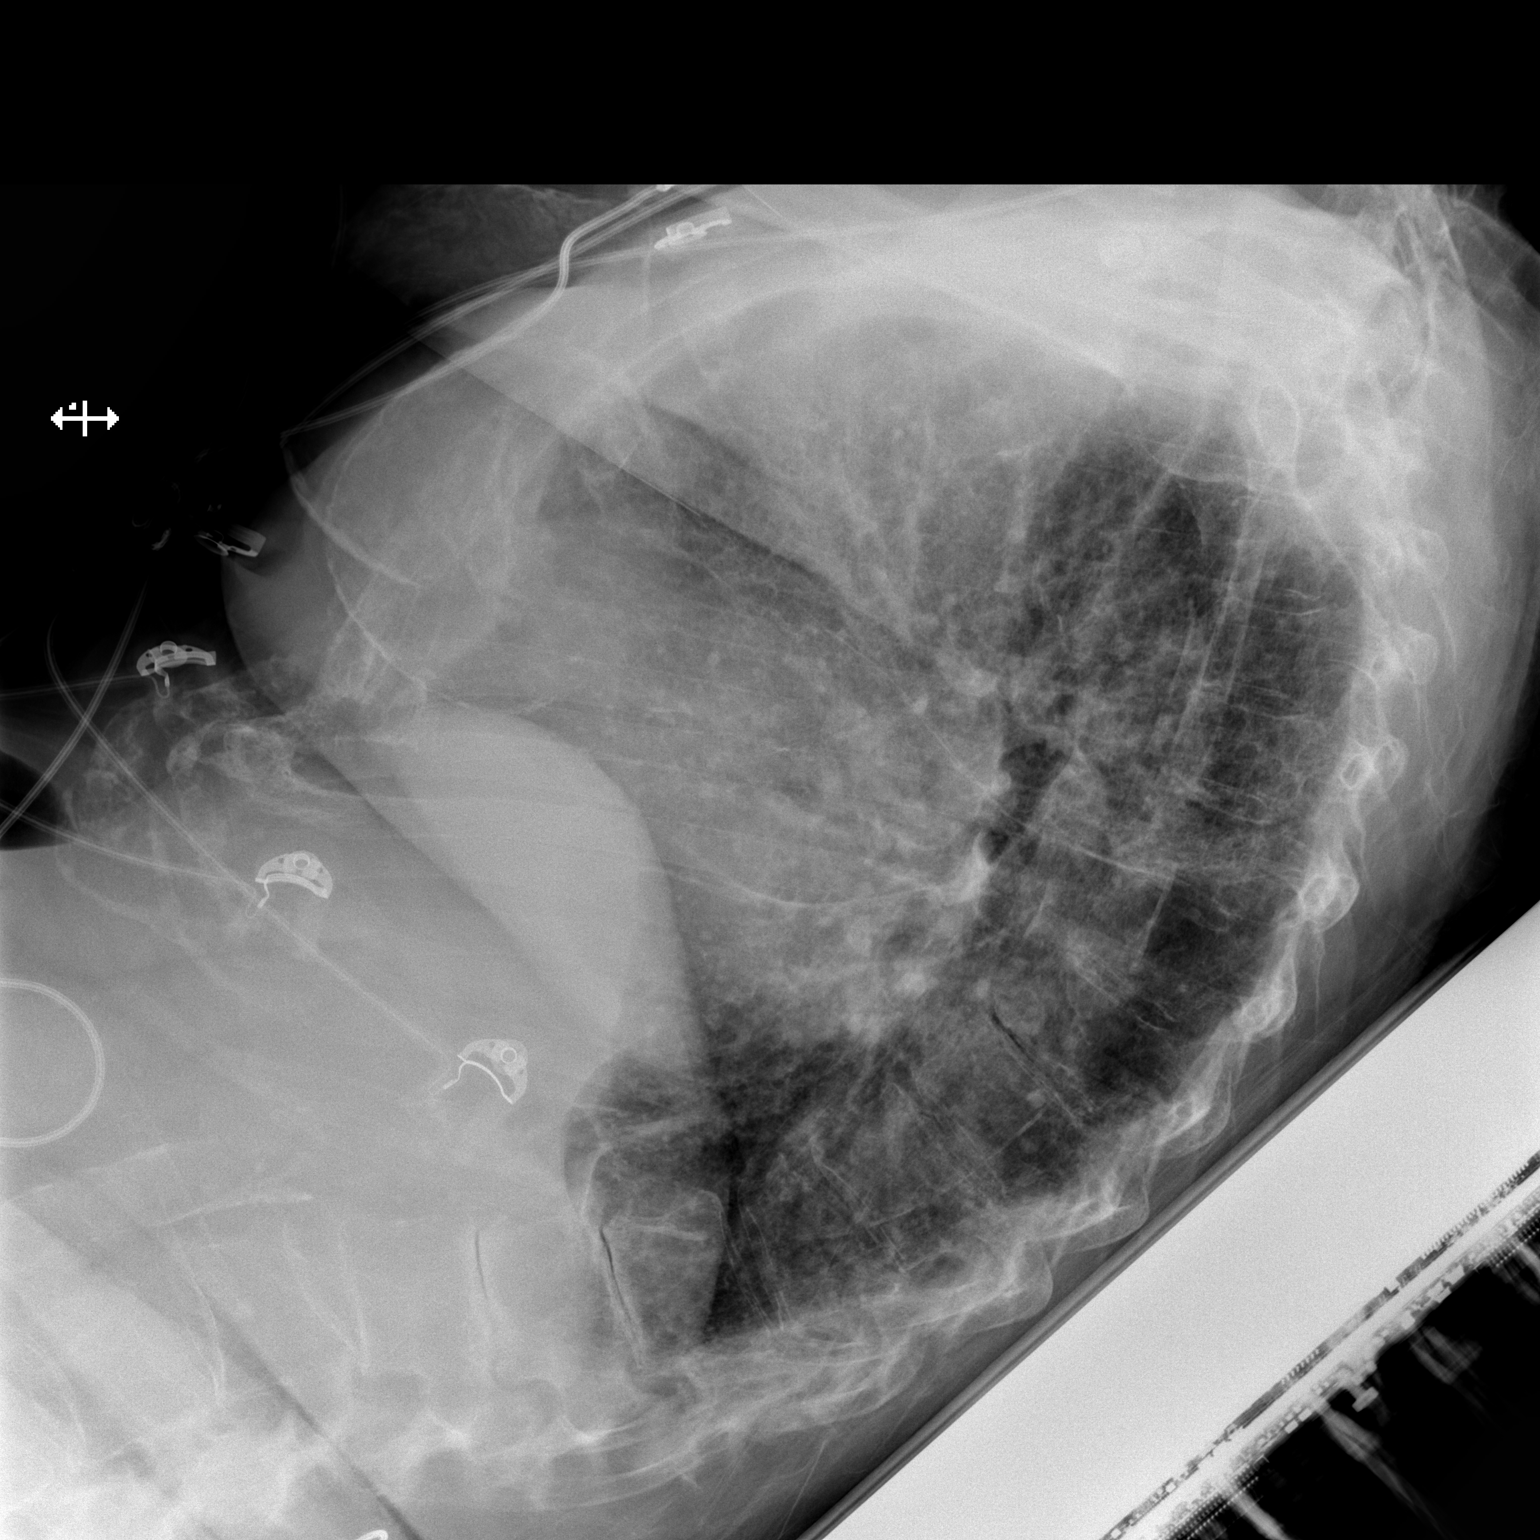

[x chest ap]
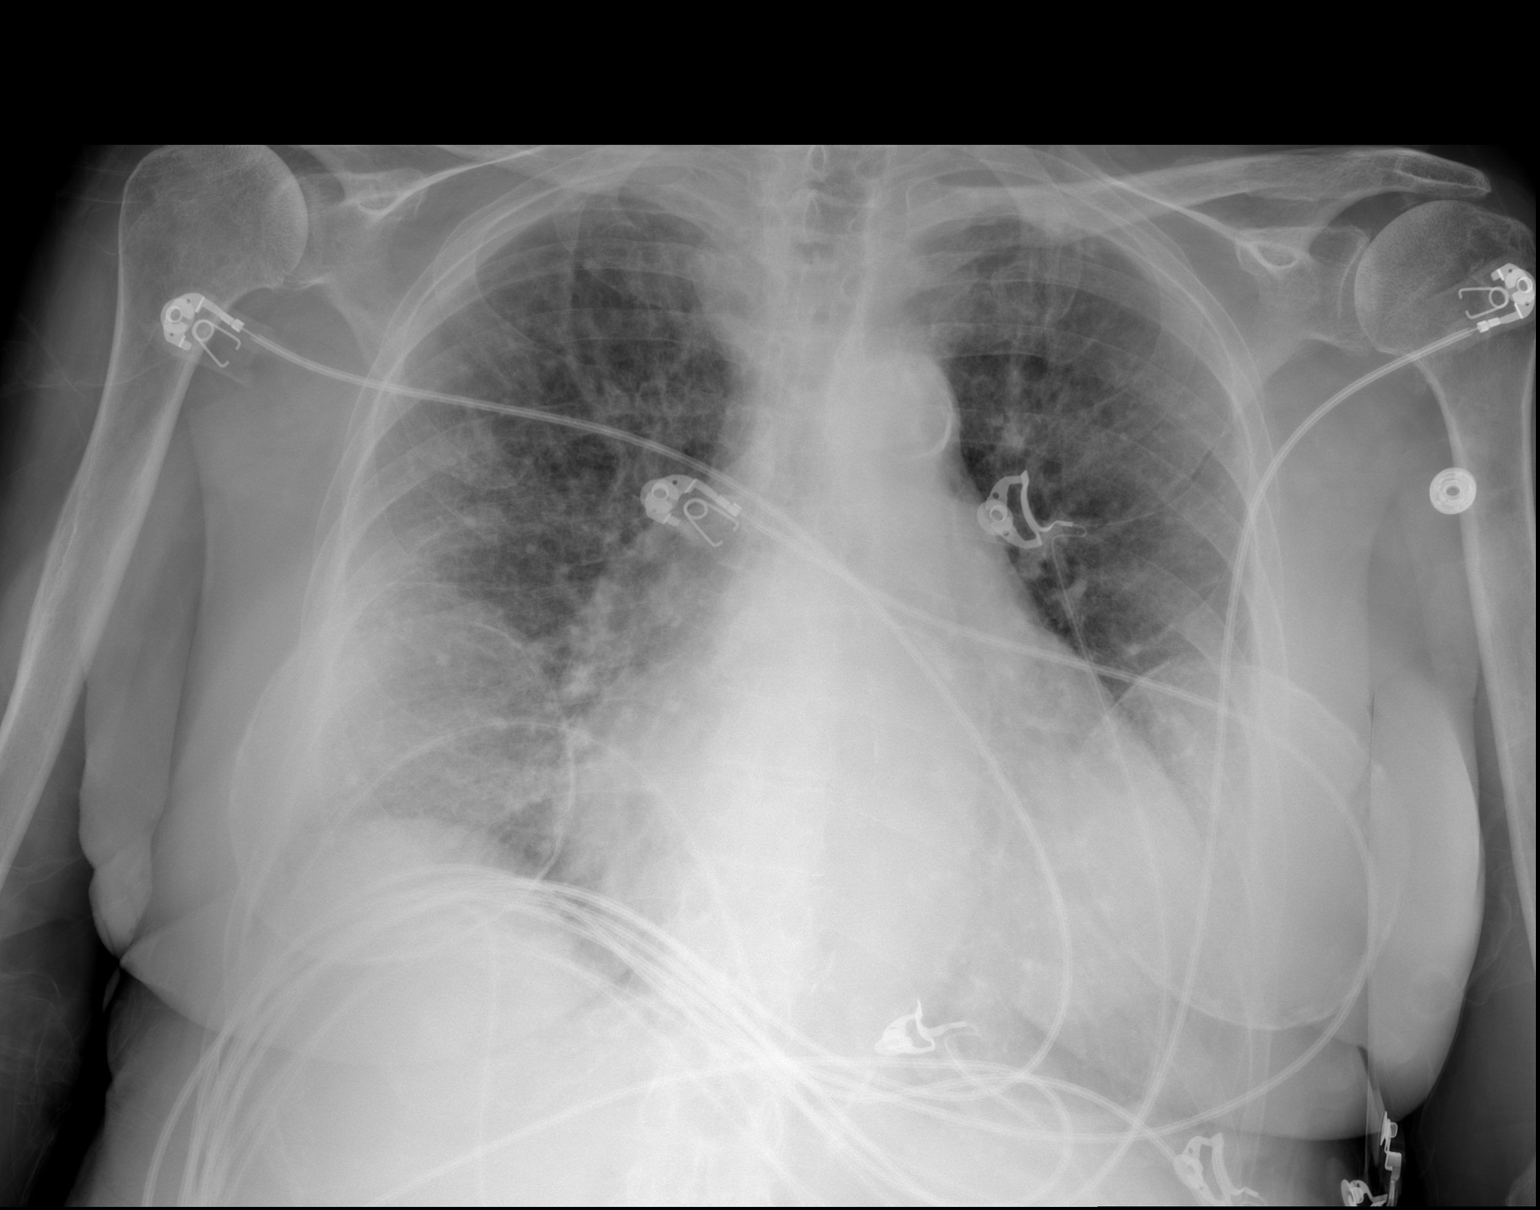

[2 of 2 positions shown; findings below may reference images not displayed]

FINDINGS: Stable mild cardiomegaly. Aortic atherosclerosis. No evidence of
acute infiltrate or pleural effusion. Pulmonary hyperinflation again
seen, suspicious for COPD. Bilateral breast implants noted with
capsular calcification.
IMPRESSION: Stable mild cardiomegaly and probable COPD. No active lung disease.
